# Patient Record
Sex: Female | Born: 1985
Health system: Southern US, Community
[De-identification: ages and names within clinical notes are randomized; demographics above are authoritative.]

## PROBLEM LIST (undated history)

## (undated) DIAGNOSIS — G709 Myoneural disorder, unspecified: Secondary | ICD-10-CM

## (undated) DIAGNOSIS — Z5189 Encounter for other specified aftercare: Secondary | ICD-10-CM

## (undated) DIAGNOSIS — M545 Low back pain, unspecified: Secondary | ICD-10-CM

## (undated) DIAGNOSIS — K219 Gastro-esophageal reflux disease without esophagitis: Secondary | ICD-10-CM

## (undated) DIAGNOSIS — L80 Vitiligo: Secondary | ICD-10-CM

## (undated) DIAGNOSIS — E063 Autoimmune thyroiditis: Secondary | ICD-10-CM

## (undated) DIAGNOSIS — D649 Anemia, unspecified: Secondary | ICD-10-CM

## (undated) DIAGNOSIS — O43129 Velamentous insertion of umbilical cord, unspecified trimester: Secondary | ICD-10-CM

## (undated) DIAGNOSIS — R002 Palpitations: Secondary | ICD-10-CM

## (undated) DIAGNOSIS — F419 Anxiety disorder, unspecified: Secondary | ICD-10-CM

## (undated) DIAGNOSIS — E282 Polycystic ovarian syndrome: Secondary | ICD-10-CM

## (undated) DIAGNOSIS — M549 Dorsalgia, unspecified: Secondary | ICD-10-CM

## (undated) DIAGNOSIS — F32A Depression, unspecified: Secondary | ICD-10-CM

## (undated) DIAGNOSIS — M2550 Pain in unspecified joint: Secondary | ICD-10-CM

## (undated) DIAGNOSIS — T7840XA Allergy, unspecified, initial encounter: Secondary | ICD-10-CM

## (undated) DIAGNOSIS — E079 Disorder of thyroid, unspecified: Secondary | ICD-10-CM

## (undated) DIAGNOSIS — M25551 Pain in right hip: Secondary | ICD-10-CM

## (undated) DIAGNOSIS — E559 Vitamin D deficiency, unspecified: Secondary | ICD-10-CM

## (undated) DIAGNOSIS — B009 Herpesviral infection, unspecified: Secondary | ICD-10-CM

## (undated) DIAGNOSIS — R0602 Shortness of breath: Secondary | ICD-10-CM

## (undated) DIAGNOSIS — Z8619 Personal history of other infectious and parasitic diseases: Secondary | ICD-10-CM

## (undated) DIAGNOSIS — K5900 Constipation, unspecified: Secondary | ICD-10-CM

## (undated) DIAGNOSIS — E739 Lactose intolerance, unspecified: Secondary | ICD-10-CM

## (undated) HISTORY — PX: NO PAST SURGERIES: SHX2092

## (undated) HISTORY — DX: Myoneural disorder, unspecified: G70.9

## (undated) HISTORY — DX: Vitamin D deficiency, unspecified: E55.9

## (undated) HISTORY — DX: Lactose intolerance, unspecified: E73.9

## (undated) HISTORY — DX: Allergy, unspecified, initial encounter: T78.40XA

## (undated) HISTORY — DX: Encounter for other specified aftercare: Z51.89

## (undated) HISTORY — DX: Pain in right hip: M25.551

## (undated) HISTORY — DX: Personal history of other infectious and parasitic diseases: Z86.19

## (undated) HISTORY — DX: Disorder of thyroid, unspecified: E07.9

## (undated) HISTORY — DX: Palpitations: R00.2

## (undated) HISTORY — DX: Polycystic ovarian syndrome: E28.2

## (undated) HISTORY — DX: Depression, unspecified: F32.A

## (undated) HISTORY — DX: Vitiligo: L80

## (undated) HISTORY — DX: Gastro-esophageal reflux disease without esophagitis: K21.9

## (undated) HISTORY — DX: Shortness of breath: R06.02

## (undated) HISTORY — DX: Dorsalgia, unspecified: M54.9

## (undated) HISTORY — DX: Herpesviral infection, unspecified: B00.9

## (undated) HISTORY — DX: Autoimmune thyroiditis: E06.3

## (undated) HISTORY — DX: Anemia, unspecified: D64.9

## (undated) HISTORY — DX: Velamentous insertion of umbilical cord, unspecified trimester: O43.129

## (undated) HISTORY — DX: Pain in unspecified joint: M25.50

## (undated) HISTORY — DX: Constipation, unspecified: K59.00

## (undated) HISTORY — DX: Anxiety disorder, unspecified: F41.9

## (undated) HISTORY — DX: Low back pain, unspecified: M54.50

---

## 2012-01-13 ENCOUNTER — Encounter: Payer: Self-pay | Admitting: Internal Medicine

## 2012-01-13 ENCOUNTER — Ambulatory Visit (INDEPENDENT_AMBULATORY_CARE_PROVIDER_SITE_OTHER): Payer: BC Managed Care – PPO | Admitting: Internal Medicine

## 2012-01-13 ENCOUNTER — Other Ambulatory Visit (INDEPENDENT_AMBULATORY_CARE_PROVIDER_SITE_OTHER): Payer: BC Managed Care – PPO

## 2012-01-13 VITALS — BP 126/82 | HR 72 | Temp 97.5°F | Ht 64.0 in | Wt 155.5 lb

## 2012-01-13 DIAGNOSIS — Z1329 Encounter for screening for other suspected endocrine disorder: Secondary | ICD-10-CM

## 2012-01-13 DIAGNOSIS — L709 Acne, unspecified: Secondary | ICD-10-CM

## 2012-01-13 DIAGNOSIS — Z131 Encounter for screening for diabetes mellitus: Secondary | ICD-10-CM

## 2012-01-13 DIAGNOSIS — Z13220 Encounter for screening for lipoid disorders: Secondary | ICD-10-CM

## 2012-01-13 DIAGNOSIS — Z0000 Encounter for general adult medical examination without abnormal findings: Secondary | ICD-10-CM

## 2012-01-13 DIAGNOSIS — Z130 Encounter for screening for diseases of the blood and blood-forming organs and certain disorders involving the immune mechanism: Secondary | ICD-10-CM

## 2012-01-13 DIAGNOSIS — L708 Other acne: Secondary | ICD-10-CM

## 2012-01-13 LAB — BASIC METABOLIC PANEL
BUN: 10 mg/dL (ref 6–23)
GFR: 66.35 mL/min (ref >60.00)
Potassium: 4.1 mEq/L (ref 3.5–5.1)
Sodium: 136 mEq/L (ref 135–145)

## 2012-01-13 LAB — CBC
Hemoglobin: 14.9 g/dL (ref 12.0–15.0)
RDW: 12.4 % (ref 11.5–14.6)
WBC: 5.6 10*3/uL (ref 4.5–10.5)

## 2012-01-13 LAB — LIPID PANEL
Total CHOL/HDL Ratio: 3
VLDL: 5.8 mg/dL (ref 0.0–40.0)

## 2012-01-13 LAB — TSH: TSH: 1.32 u[IU]/mL (ref 0.35–5.50)

## 2012-01-13 MED ORDER — BENZOYL PEROXIDE 5 % EX GEL: Freq: Every day | CUTANEOUS | Status: DC

## 2012-01-13 NOTE — Progress Notes (Signed)
HPI  Pt presents to the clinic today to establish care. Her only concern is that she has had an increase in acne and brittle hair over the last year. She is taking a prenatal vitamin. She does have a family history of thyroid problems. She has never been seen for her acne or hair problem.  Past Medical History  Diagnosis Date  . History of chicken pox     Current Outpatient Prescriptions  Medication Sig Dispense Refill  . benzoyl peroxide 5 % gel Apply topically daily.  45 g  0  . BIOTIN PO Take 1 tablet by mouth daily. 10,079mcg      . Prenatal Vitamins (DIS) TABS Take 1 tablet by mouth daily.        No Known Allergies  Family History  Problem Relation Age of Onset  . Cancer Mother     Cervical Cancer  . Hepatitis C Father   . Thyroid disease Maternal Aunt   . Thyroid disease Maternal Grandmother   . Stroke Neg Hx   . Hypertension Neg Hx   . Hyperlipidemia Neg Hx   . Early death Neg Hx     History   Social History  . Marital Status: Single    Spouse Name: N/A    Number of Children: N/A  . Years of Education: 16+   Occupational History  . Occupational therapist    Social History Main Topics  . Smoking status: Former Games developer  . Smokeless tobacco: Never Used  . Alcohol Use: 0.6 oz/week    1 Glasses of wine per week  . Drug Use: No  . Sexually Active: Not on file   Other Topics Concern  . Not on file   Social History Narrative   Regular exercise-yesCaffeine Use-yes    ROS:  Constitutional: Denies fever, malaise, fatigue, headache or abrupt weight changes.  HEENT: Denies eye pain, eye redness, ear pain, ringing in the ears, wax buildup, runny nose, nasal congestion, bloody nose, or sore throat. Respiratory: Denies difficulty breathing, shortness of breath, cough or sputum production.   Cardiovascular: Denies chest pain, chest tightness, palpitations or swelling in the hands or feet.  Gastrointestinal: Denies abdominal pain, bloating, constipation, diarrhea or  blood in the stool.  GU: Denies frequency, urgency, pain with urination, blood in urine, odor or discharge. Musculoskeletal: Denies decrease in range of motion, difficulty with gait, muscle pain or joint pain and swelling.  Skin: Denies redness, rashes, lesions or ulcercations.  Neurological: Denies dizziness, difficulty with memory, difficulty with speech or problems with balance and coordination.   No other specific complaints in a complete review of systems (except as listed in HPI above).  PE:  BP 126/82  Pulse 72  Temp 97.5 F (36.4 C) (Oral)  Ht 5\' 4"  (1.626 m)  Wt 155 lb 8 oz (70.534 kg)  BMI 26.69 kg/m2  SpO2 97%  LMP 12/20/2011 Wt Readings from Last 3 Encounters:  01/13/12 155 lb 8 oz (70.534 kg)    General: Appears their stated age, well developed, well nourished in NAD. Skin: Moderate acne on the base of the chin. Hair coarse to the touch. HEENT: Head: normal shape and size; Eyes: sclera white, no icterus, conjunctiva pink, PERRLA and EOMs intact; Ears: Tm's gray and intact, normal light reflex; Nose: mucosa pink and moist, septum midline; Throat/Mouth: Teeth present, mucosa pink and moist, no lesions or ulcerations noted.  Neck: Normal range of motion. Neck supple, trachea midline. No massses, lumps or thyromegaly present.  Cardiovascular: Normal  rate and rhythm. S1,S2 noted.  No murmur, rubs or gallops noted. No JVD or BLE edema. No carotid bruits noted. Pulmonary/Chest: Normal effort and positive vesicular breath sounds. No respiratory distress. No wheezes, rales or ronchi noted.  Abdomen: Soft and nontender. Normal bowel sounds, no bruits noted. No distention or masses noted. Liver, spleen and kidneys non palpable. Musculoskeletal: Normal range of motion. No signs of joint swelling. No difficulty with gait.  Neurological: Alert and oriented. Cranial nerves II-XII intact. Coordination normal. +DTRs bilaterally. Psychiatric: Mood and affect normal. Behavior is normal.  Judgment and thought content normal.     Assessment and Plan:  Preventative Health Maintenance:  Will obtain basic labs, CBC, BMET, lipids,  Continue diet and exercise Schedule pap smear next year Decline flu shot Tetanus UTD  Brittle hair and acne, new onset with additional workup required:  Will check TSH and testosterone eRx given for benzyl peroxide  RTC in 1 year or sooner if needed

## 2012-01-13 NOTE — Patient Instructions (Signed)
Health Maintenance, Females A healthy lifestyle and preventative care can promote health and wellness.  Maintain regular health, dental, and eye exams.  Eat a healthy diet. Foods like vegetables, fruits, whole grains, low-fat dairy products, and lean protein foods contain the nutrients you need without too many calories. Decrease your intake of foods high in solid fats, added sugars, and salt. Get information about a proper diet from your caregiver, if necessary.  Regular physical exercise is one of the most important things you can do for your health. Most adults should get at least 150 minutes of moderate-intensity exercise (any activity that increases your heart rate and causes you to sweat) each week. In addition, most adults need muscle-strengthening exercises on 2 or more days a week.   Maintain a healthy weight. The body mass index (BMI) is a screening tool to identify possible weight problems. It provides an estimate of body fat based on height and weight. Your caregiver can help determine your BMI, and can help you achieve or maintain a healthy weight. For adults 20 years and older:  A BMI below 18.5 is considered underweight.  A BMI of 18.5 to 24.9 is normal.  A BMI of 25 to 29.9 is considered overweight.  A BMI of 30 and above is considered obese.  Maintain normal blood lipids and cholesterol by exercising and minimizing your intake of saturated fat. Eat a balanced diet with plenty of fruits and vegetables. Blood tests for lipids and cholesterol should begin at age 20 and be repeated every 5 years. If your lipid or cholesterol levels are high, you are over 50, or you are a high risk for heart disease, you may need your cholesterol levels checked more frequently.Ongoing high lipid and cholesterol levels should be treated with medicines if diet and exercise are not effective.  If you smoke, find out from your caregiver how to quit. If you do not use tobacco, do not start.  If you  are pregnant, do not drink alcohol. If you are breastfeeding, be very cautious about drinking alcohol. If you are not pregnant and choose to drink alcohol, do not exceed 1 drink per day. One drink is considered to be 12 ounces (355 mL) of beer, 5 ounces (148 mL) of wine, or 1.5 ounces (44 mL) of liquor.  Avoid use of street drugs. Do not share needles with anyone. Ask for help if you need support or instructions about stopping the use of drugs.  High blood pressure causes heart disease and increases the risk of stroke. Blood pressure should be checked at least every 1 to 2 years. Ongoing high blood pressure should be treated with medicines, if weight loss and exercise are not effective.  If you are 55 to 26 years old, ask your caregiver if you should take aspirin to prevent strokes.  Diabetes screening involves taking a blood sample to check your fasting blood sugar level. This should be done once every 3 years, after age 45, if you are within normal weight and without risk factors for diabetes. Testing should be considered at a younger age or be carried out more frequently if you are overweight and have at least 1 risk factor for diabetes.  Breast cancer screening is essential preventative care for women. You should practice "breast self-awareness." This means understanding the normal appearance and feel of your breasts and may include breast self-examination. Any changes detected, no matter how small, should be reported to a caregiver. Women in their 20s and 30s should have   a clinical breast exam (CBE) by a caregiver as part of a regular health exam every 1 to 3 years. After age 40, women should have a CBE every year. Starting at age 40, women should consider having a mammogram (breast X-ray) every year. Women who have a family history of breast cancer should talk to their caregiver about genetic screening. Women at a high risk of breast cancer should talk to their caregiver about having an MRI and a  mammogram every year.  The Pap test is a screening test for cervical cancer. Women should have a Pap test starting at age 21. Between ages 21 and 29, Pap tests should be repeated every 2 years. Beginning at age 30, you should have a Pap test every 3 years as long as the past 3 Pap tests have been normal. If you had a hysterectomy for a problem that was not cancer or a condition that could lead to cancer, then you no longer need Pap tests. If you are between ages 65 and 70, and you have had normal Pap tests going back 10 years, you no longer need Pap tests. If you have had past treatment for cervical cancer or a condition that could lead to cancer, you need Pap tests and screening for cancer for at least 20 years after your treatment. If Pap tests have been discontinued, risk factors (such as a new sexual partner) need to be reassessed to determine if screening should be resumed. Some women have medical problems that increase the chance of getting cervical cancer. In these cases, your caregiver may recommend more frequent screening and Pap tests.  The human papillomavirus (HPV) test is an additional test that may be used for cervical cancer screening. The HPV test looks for the virus that can cause the cell changes on the cervix. The cells collected during the Pap test can be tested for HPV. The HPV test could be used to screen women aged 30 years and older, and should be used in women of any age who have unclear Pap test results. After the age of 30, women should have HPV testing at the same frequency as a Pap test.  Colorectal cancer can be detected and often prevented. Most routine colorectal cancer screening begins at the age of 50 and continues through age 75. However, your caregiver may recommend screening at an earlier age if you have risk factors for colon cancer. On a yearly basis, your caregiver may provide home test kits to check for hidden blood in the stool. Use of a small camera at the end of a  tube, to directly examine the colon (sigmoidoscopy or colonoscopy), can detect the earliest forms of colorectal cancer. Talk to your caregiver about this at age 50, when routine screening begins. Direct examination of the colon should be repeated every 5 to 10 years through age 75, unless early forms of pre-cancerous polyps or small growths are found.  Hepatitis C blood testing is recommended for all people born from 1945 through 1965 and any individual with known risks for hepatitis C.  Practice safe sex. Use condoms and avoid high-risk sexual practices to reduce the spread of sexually transmitted infections (STIs). Sexually active women aged 25 and younger should be checked for Chlamydia, which is a common sexually transmitted infection. Older women with new or multiple partners should also be tested for Chlamydia. Testing for other STIs is recommended if you are sexually active and at increased risk.  Osteoporosis is a disease in which the   bones lose minerals and strength with aging. This can result in serious bone fractures. The risk of osteoporosis can be identified using a bone density scan. Women ages 65 and over and women at risk for fractures or osteoporosis should discuss screening with their caregivers. Ask your caregiver whether you should be taking a calcium supplement or vitamin D to reduce the rate of osteoporosis.  Menopause can be associated with physical symptoms and risks. Hormone replacement therapy is available to decrease symptoms and risks. You should talk to your caregiver about whether hormone replacement therapy is right for you.  Use sunscreen with a sun protection factor (SPF) of 30 or greater. Apply sunscreen liberally and repeatedly throughout the day. You should seek shade when your shadow is shorter than you. Protect yourself by wearing long sleeves, pants, a wide-brimmed hat, and sunglasses year round, whenever you are outdoors.  Notify your caregiver of new moles or  changes in moles, especially if there is a change in shape or color. Also notify your caregiver if a mole is larger than the size of a pencil eraser.  Stay current with your immunizations. Document Released: 08/06/2010 Document Revised: 04/15/2011 Document Reviewed: 08/06/2010 [employer] Patient Information 2013 [employer]. Breast Self-Exam A self breast exam may help you find changes or problems while they are still small. Do a breast self-exam:  Every month.  One week after your period (menstrual period).  On the first day of each month if you do not have periods anymore. Look for any:  Change in breast color, size, or shape.  Dimples in your breast.  Changes in your nipples or skin.  Dry skin on your breasts or nipples.  Watery or bloody discharge from your nipples.  Feel for:  Lumps.  Thick, hard places.  Any other changes. HOME CARE There are 3 ways to do the breast self-exam: In front of a mirror.  Lift your arms over your head and turn side to side.  Put your hands on your hips and lean down, then turn from side to side.  Bend forward and turn from side to side. In the shower.  With soapy hands, check both breasts. Then check above and below your collarbone and your armpits.  Feel above and below your collarbone down to under your breast, and from the center of your chest to the outer edge of the armpit. Check for any lumps or hard spots.  Using the tips of your middle three fingers check your whole breast by pressing your hand over your breast in a circle or in an up and down motion. Lying down.  Lie flat on your bed.  Put a small pillow under the breast you are going to check. On that same side, put your hand behind your head.  With your other hand, use the 3 middle fingers to feel the breast.  Move your fingers in a circle around the breast. Press firmly over all parts of the breast to feel for any lumps. GET HELP RIGHT AWAY IF: You find any  changes in your breasts so they can be checked. Document Released: 07/10/2007 Document Revised: 04/15/2011 Document Reviewed: 05/11/2008 [employer] Patient Information 2013 [employer].  

## 2012-01-14 ENCOUNTER — Telehealth: Payer: Self-pay | Admitting: *Deleted

## 2012-01-14 ENCOUNTER — Encounter: Payer: Self-pay | Admitting: Internal Medicine

## 2012-01-14 LAB — TESTOSTERONE, FREE, TOTAL, SHBG
Testosterone-% Free: 1.4 % (ref 0.4–2.4)
Testosterone: 73.9 ng/dL — ABNORMAL HIGH (ref 10–70)

## 2012-01-14 NOTE — Telephone Encounter (Signed)
Left message for pt to callback office.     [name],  Can you please send this and also call [name] and let her know that her labs were normal except her testosterone level which was high. This is most likely the cause of her acne and brittle hair. I suggest that she f/u with endocrinology. I will put in that referral for her. Thx!  Rene Kocher

## 2012-01-14 NOTE — Telephone Encounter (Signed)
Left message for pt to callback office.  

## 2012-01-16 NOTE — Telephone Encounter (Signed)
Left message for pt to callback office.  

## 2012-01-16 NOTE — Telephone Encounter (Signed)
Pt informed of results and of referral to endo.

## 2012-01-27 ENCOUNTER — Telehealth: Payer: Self-pay | Admitting: Internal Medicine

## 2012-01-27 ENCOUNTER — Other Ambulatory Visit: Payer: Self-pay | Admitting: Internal Medicine

## 2012-01-27 DIAGNOSIS — R7989 Other specified abnormal findings of blood chemistry: Secondary | ICD-10-CM

## 2012-01-27 NOTE — Progress Notes (Signed)
High testosterone, would like referral to endocrinology

## 2012-01-27 NOTE — Telephone Encounter (Signed)
Appt has been made and pt is aware.

## 2012-01-27 NOTE — Telephone Encounter (Signed)
[name], Referral placed. Let her know that our PCC's with call her with the appointment date and time. Thx! Rene Kocher

## 2012-01-27 NOTE — Telephone Encounter (Signed)
Pt received a letter suggesting she be referred to endocrinology.  She called to say she does want the referral.

## 2012-01-28 ENCOUNTER — Ambulatory Visit: Payer: BC Managed Care – PPO | Admitting: Internal Medicine

## 2012-02-04 ENCOUNTER — Ambulatory Visit (INDEPENDENT_AMBULATORY_CARE_PROVIDER_SITE_OTHER): Payer: BC Managed Care – PPO | Admitting: Internal Medicine

## 2012-02-04 VITALS — BP 122/80 | HR 93 | Wt 156.0 lb

## 2012-02-04 DIAGNOSIS — E282 Polycystic ovarian syndrome: Secondary | ICD-10-CM

## 2012-02-04 DIAGNOSIS — E281 Androgen excess: Secondary | ICD-10-CM | POA: Insufficient documentation

## 2012-02-04 DIAGNOSIS — E288 Other ovarian dysfunction: Secondary | ICD-10-CM

## 2012-02-04 LAB — HEMOGLOBIN A1C
Hgb A1c MFr Bld: 5.2 % (ref <5.7)
Mean Plasma Glucose: 103 mg/dL (ref <117)

## 2012-02-04 LAB — ESTRADIOL: Estradiol: 57.3 pg/mL

## 2012-02-04 LAB — LUTEINIZING HORMONE: LH: 6.7 m[IU]/mL

## 2012-02-04 NOTE — Progress Notes (Signed)
Subjective:     Patient ID: [name], female   DOB: Jun 01, 1985, 26 y.o.   MRN: [id number]  HPI [name] is a very pleasant 26 y/o woman referred by her PCP, Dr. Sampson Camacho, for management of hyperandrogenemia, acne, and hirsutism.   Pt complains of having acne since puberty but in "bouts" - lately (in last year) harder to go away and painful. It occurs always on the chin. She started Benzoil peroxide by Dr. Sampson Camacho > acne a little better.   She also has ark hairs on neck and chest but not face, however she noted dark hairs on upper lip. Her scalp feels inflamed and burning, some thinning, losing some hair. She does have acne near hairline. Skin on scalp is scaly. No balding spots. She started to notice the hair 4 years ago, now increased. She also feels her scalp burning sometimes (started during the summer), despite changing her shampoo to [place].  Her menstrual cycles used to come every month (not exactly at 28 days), but missed November. She was on OCP (last dose 06/2011) - 21/7, cannot remember name. She feels her periods are now the same as before she started the pill. She was on OCPs for 8 years. When she was on it her hair and acne was not as bad, but in the last year, she was changed to another OCP and acne got worse.   She does not want to get pregnant for now.   No recent h/o weight gain or loss. She avoids a lot of sugar, wheat or dairy. No migraines, no HTN, no FH or personal h/o heart ds, no smoking, no personal or family h/o blood clots. No FH of diabetes. Aunts have thyroid ds. No FH of infertility or irreg. menses.   I reviewed her chart including office notes, telephone notes, labs. 01/13/12: BMP nl, CBC nl, testosterone free 10 (0.6-6.8), SHBG 52 (18-114), lipids: 188/29/64/118  Review of Systems Constitutional: no weight gain/loss, + fatigue, no subjective hyperthermia/hypothermia. No increased thirst, urinates more frequently since starting to drink more water Eyes: no blurry  vision, no xerophthalmia ENT: no sore throat, no nodules palpated in throat, no dysphagia/odynophagia, no hoarseness Cardiovascular: no CP/SOB/palpitations/leg swelling Respiratory: no cough/SOB Gastrointestinal: no N/V/D/C Musculoskeletal: no muscle/joint aches Skin: acne, excess hair on neck and chest, no new, wide, stretch marks Neurological: no tremors/numbness/tingling/dizziness Psychiatric: no depression/anxiety  Past Medical History  Diagnosis Date  . History of chicken pox    No past surgical history.  History   Social History  . Marital Status: Single    Spouse Name: N/A    Number of Children: N/A  . Years of Education: 16+   Occupational History  . Occupational therapist    Social History Main Topics  . Smoking status: Former Games developer  . Smokeless tobacco: Never Used  . Alcohol Use: 0.6 oz/week    1 Glasses of wine per week  . Drug Use: No  . Sexually Active: Not on file   Other Topics Concern  . Not on file   Social History Narrative   Regular exercise-yesCaffeine Use-yes   Current Outpatient Prescriptions on File Prior to Visit  Medication Sig Dispense Refill  . benzoyl peroxide 5 % gel Apply topically daily.  45 g  0  . BIOTIN PO Take 1 tablet by mouth daily. 10,043mcg      . Prenatal Vitamins (DIS) TABS Take 1 tablet by mouth daily. - to help with hair thinning       No  Known Allergies  Family History  Problem Relation Age of Onset  . Cancer Mother     Cervical Cancer  . Hepatitis C Father   . Thyroid disease Maternal Aunt   . Thyroid disease Maternal Grandmother   . Stroke Neg Hx   . Hypertension Neg Hx   . Hyperlipidemia Neg Hx   . Early death Neg Hx    Objective:   Physical Exam BP 122/80  Pulse 93  Wt 156 lb (70.761 kg)  SpO2 98%  LMP 02/01/2012, before that: 12/20/2011 Wt Readings from Last 3 Encounters:  02/04/12 156 lb (70.761 kg)  01/13/12 155 lb 8 oz (70.534 kg)   Constitutional: overweight, in NAD Eyes: PERRLA, EOMI, no  exophthalmos ENT: moist mucous membranes, no thyromegaly, no cervical lymphadenopathy Cardiovascular: RRR, No MRG Respiratory: CTA B Gastrointestinal: abdomen soft, NT, ND, BS+ Musculoskeletal: no deformities, strength intact in all 4 Skin: moist, warm, no rashes; no skin tags or acanthosis nigricans. Vellum on sideburns, some roots for terminal hair visible on neck (<10) and chest (same) - plucked. Few shaft around each nipple (plucked). Darker vellum above upper lip. Mild acne on chin. No abnormalities seen on scalp. Neurological: no tremor with outstretched hands, DTR normal in all 4  Assessment:     1. PCOS - testosterone free 10  (0.6-6.8), SHBG 52 (18-114) - lipids: 188/29/64/118    Plan:     Pt likely has a mild form of PCOS as she meets 2/3  [place] criteria: has hyperandrogenemia and irregular menstrual cycles (skipped period last month, variable intervals between menses).  - Will order: Orders Placed This Encounter  Procedures  . Prolactin  . TSH  . HgB A1c  . 17-Hydroxyprogesterone  . Estradiol  . LH  . FSH  - weight loss is usually the first measurement to decrease insulin resistance, and thus hyperandrogenemia manifestations (acne and hirsutism) - I advised her to continue to institute healthy changes in her diet as she already started to do and also to try to lose a little weight - I also recommended that she restarted the OCP that she tolerated well in the past and she will find out the name and call me back - I recommended that she starts seeing an ObGyn - Laser therapy is also an option for her since she has such few terminal hair shafts on neck/chest - If results not satisfactory, we can add Eflornitine cream (although with these, the hair is controlled only for as long she applies it) or Spironolactone (to add to OCP) - I advised her to see a dermatologist if the scalp burning sensation recurs - I will see the pt back in 6 mo for f/u - Advised to join  [employer] Visit on 02/04/2012  Component Date Value Range Status  . Prolactin 02/04/2012 5.4   Final   Comment:      Reference Ranges:                                           Female:                       2.1 -  17.1 ng/ml  Female:   Pregnant          9.7 - 208.5 ng/mL                                                     Non Pregnant      2.8 -  29.2 ng/mL                                                     Post Menopausal   1.8 -  20.3 ng/mL                                              . TSH 02/04/2012 1.642  0.350 - 4.500 uIU/mL Final  . Hemoglobin A1C 02/04/2012 5.2  <5.7 % Final   Comment:                                                                                                 According to the ADA Clinical Practice Recommendations for 2011, when                          HbA1c is used as a screening test:                                                       >=6.5%   Diagnostic of Diabetes Mellitus                                     (if abnormal result is confirmed)                                                     5.7-6.4%   Increased risk of developing Diabetes Mellitus                                                     References:Diagnosis and Classification of Diabetes Mellitus,Diabetes                          Care,2011,34(Suppl 1):S62-S69 and Standards of Medical  Care in                                  Diabetes - 2011,Diabetes Care,2011,34 (Suppl 1):S11-S61.                             . Mean Plasma Glucose 02/04/2012 103  <117 mg/dL Final  . 14-NW-[id number], LC/MS/MS 02/04/2012 22   Final   Comment:                            Adult Female Reference Ranges for                            17-Hydroxyprogesterone, LC/MS/MS:                                                        Follicular Phase:       < or = 185 ng/dL                             Luteal Phase:           < or = 285 ng/dL                              Postmenopausal Phase:   < or = 45 ng/dL                             Pregnancy:                             First Trimester:  78-457 ng/dL                             Second Trimester: 90-357 ng/dL                             Third Trimester:  144-578 ng/dL  . Estradiol 02/04/2012 57.3   Final   Comment:                               Males                           0.0 -  39.0 pg/mL                                                        Menstruating Females (by day in cycle relative to LH peak)  Follicular phase (-12 to -4)   19.5 - 144.2 pg/mL                                                        Midcycle          (-3 to +2)   63.9 - 356.7 pg/mL                                                          Postmenopausal Females          0.0 -  32.2 pg/mL                               (untreated)                             . LH 02/04/2012 6.7   Final   Comment: Reference Ranges:                                   Female:     20 - 70 Years           1.5 -  9.3 mIU/mL                                                > 70 Years           3.1 - 34.6 mIU/mL                                   Female:   Follicular Phase        1.9 - 12.5 mIU/mL                                             Midcycle                8.7 - 76.3 mIU/mL                                             Luteal Phase            0.5 - 16.9 mIU/mL                                             Post Menopausal        15.9 - 54.0 mIU/mL  Pregnant                    <  1.5 mIU/mL                                             Contraceptives          0.7 -  5.6 mIU/mL                                   Children:                             <  6.0 mIU/mL                             . [facility] 02/04/2012 7.2   Final   Comment: Reference Ranges:                                   Female:                         1.4 -  18.1 mIU/mL                                    Female:   Follicular Phase    2.5 -  10.2 mIU/mL                                             MidCycle Peak       3.4 -  33.4 mIU/mL                                             Luteal Phase        1.5 -   9.1 mIU/mL                                             Post Menopausal    23.0 - 116.3 mIU/mL                                             Pregnant                <   0.3 mIU/mL   Normal HbA1C (no prediabetes/diabetes), normal TSH (no thyroid ds.), normal PRL (no prolactinoma),17 HO Progesterone and estradiol (no Nonclassic CAH), and normal LH, FSH and E2. In the light of recent high free testosterone and missed menstrual cycles, she might have PCOS, although a mild form. I believe that her metabolic status has been improving since she changed her diet. Will send  her letter as she does not have an active MyChart yet.  Will await her phone call to start her OCP.

## 2012-02-04 NOTE — Patient Instructions (Addendum)
You most likely have a mild form of polycystic ovarian syndrome. Please call me with the name of the oral contraceptive that you had before. I will let you know the results of your labs. Please try to join MyChart.

## 2012-02-09 LAB — 17-HYDROXYPROGESTERONE: 17-OH-Progesterone, LC/MS/MS: 22 ng/dL

## 2012-02-10 ENCOUNTER — Encounter: Payer: Self-pay | Admitting: Internal Medicine

## 2012-02-13 ENCOUNTER — Encounter: Payer: Self-pay | Admitting: Internal Medicine

## 2012-02-24 ENCOUNTER — Ambulatory Visit (INDEPENDENT_AMBULATORY_CARE_PROVIDER_SITE_OTHER): Payer: BC Managed Care – PPO | Admitting: Internal Medicine

## 2012-02-24 ENCOUNTER — Encounter: Payer: Self-pay | Admitting: Internal Medicine

## 2012-02-24 ENCOUNTER — Other Ambulatory Visit: Payer: Self-pay | Admitting: Internal Medicine

## 2012-02-24 ENCOUNTER — Telehealth: Payer: Self-pay | Admitting: *Deleted

## 2012-02-24 VITALS — BP 130/78 | HR 74 | Temp 99.3°F | Ht 64.0 in | Wt 156.2 lb

## 2012-02-24 DIAGNOSIS — J069 Acute upper respiratory infection, unspecified: Secondary | ICD-10-CM

## 2012-02-24 DIAGNOSIS — H109 Unspecified conjunctivitis: Secondary | ICD-10-CM

## 2012-02-24 MED ORDER — AZITHROMYCIN 1 % OP SOLN: 2.0000 [drp] | Freq: Two times a day (BID) | OPHTHALMIC | Status: DC

## 2012-02-24 MED ORDER — ERYTHROMYCIN 5 MG/GM OP OINT: TOPICAL_OINTMENT | Freq: Every day | OPHTHALMIC | Status: DC

## 2012-02-24 NOTE — Patient Instructions (Addendum)
Conjunctivitis Conjunctivitis is commonly called "pink eye." Conjunctivitis can be caused by bacterial or viral infection, allergies, or injuries. There is usually redness of the lining of the eye, itching, discomfort, and sometimes discharge. There may be deposits of matter along the eyelids. A viral infection usually causes a watery discharge, while a bacterial infection causes a yellowish, thick discharge. Pink eye is very contagious and spreads by direct contact. You may be given antibiotic eyedrops as part of your treatment. Before using your eye medicine, remove all drainage from the eye by washing gently with warm water and cotton balls. Continue to use the medication until you have awakened 2 mornings in a row without discharge from the eye. Do not rub your eye. This increases the irritation and helps spread infection. Use separate towels from other household members. Wash your hands with soap and water before and after touching your eyes. Use cold compresses to reduce pain and sunglasses to relieve irritation from light. Do not wear contact lenses or wear eye makeup until the infection is gone. SEEK MEDICAL CARE IF:   Your symptoms are not better after 3 days of treatment.  You have increased pain or trouble seeing.  The outer eyelids become very red or swollen. Document Released: 02/29/2004 Document Revised: 04/15/2011 Document Reviewed: 01/21/2005 ExitCare Patient Information 2013 [employer].  

## 2012-02-24 NOTE — Progress Notes (Signed)
Subjective:    Patient ID: [name], female    DOB: January 13, 1986, 27 y.o.   MRN: [id number]  HPI  Pt presents to the clinic today with c/o possible pink eye. This started in her right eye 4 days ago. She woke up and her eye was red and painful and stuck shut with green goop. It has now progressed to her left eye. She also c/o of a cough with green mucous production. This started 2 days ago. She does not fee bad. She has not taken anything OTC for this. She does have sick contacts.   Review of Systems  Past Medical History  Diagnosis Date  . History of chicken pox     Current Outpatient Prescriptions  Medication Sig Dispense Refill  . benzoyl peroxide 5 % gel Apply topically daily.  45 g  0  . BIOTIN PO Take 1 tablet by mouth daily. 10,039mcg      . Prenatal Vitamins (DIS) TABS Take 1 tablet by mouth daily.        No Known Allergies  Family History  Problem Relation Age of Onset  . Cancer Mother     Cervical Cancer  . Hepatitis C Father   . Thyroid disease Maternal Aunt   . Thyroid disease Maternal Grandmother   . Stroke Neg Hx   . Hypertension Neg Hx   . Hyperlipidemia Neg Hx   . Early death Neg Hx     History   Social History  . Marital Status: Single    Spouse Name: N/A    Number of Children: N/A  . Years of Education: 16+   Occupational History  . Occupational therapist    Social History Main Topics  . Smoking status: Former Games developer  . Smokeless tobacco: Never Used  . Alcohol Use: 0.6 oz/week    1 Glasses of wine per week  . Drug Use: No  . Sexually Active: Not on file   Other Topics Concern  . Not on file   Social History Narrative   Regular exercise-yesCaffeine Use-yes     Constitutional: Denies fever, malaise, fatigue, headache or abrupt weight changes.  HEENT: Pt reports eye pain, eye redness and crusting. Denies blurred ear pain, ringing in the ears, wax buildup, runny nose, nasal congestion, bloody nose, or sore throat.  Respiratory: Pt  reports cough and mucous production. Denies difficulty breathing or shortness of breath. Skin: Denies redness, rashes, lesions or ulcercations.    No other specific complaints in a complete review of systems (except as listed in HPI above).     Objective:   Physical Exam   BP 130/78  Pulse 74  Temp 99.3 F (37.4 C) (Oral)  Ht 5\' 4"  (1.626 m)  Wt 156 lb 4 oz (70.875 kg)  BMI 26.82 kg/m2  SpO2 97%  LMP 01/19/2012 Wt Readings from Last 3 Encounters:  02/24/12 156 lb 4 oz (70.875 kg)  02/04/12 156 lb (70.761 kg)  01/13/12 155 lb 8 oz (70.534 kg)    General: Appears her stated age, well developed, well nourished in NAD. Skin: Warm, dry and intact. No rashes, lesions or ulcerations noted. HEENT: Head: normal shape and size; Eyes: sclera injected, no icterus, conjunctiva erythematous, green discharge noted, PERRLA and EOMs intact; Ears: Tm's gray and intact, normal light reflex; Nose: mucosa pink and moist, septum midline; Throat/Mouth: Teeth present, mucosa pink and moist, no exudate, lesions or ulcerations noted.  Neck: Normal range of motion. Neck supple, trachea midline. No massses, lumps or thyromegaly  present.  Cardiovascular: Normal rate and rhythm. S1,S2 noted.  No murmur, rubs or gallops noted. No JVD or BLE edema. No carotid bruits noted. Pulmonary/Chest: Normal effort and positive vesicular breath sounds. No respiratory distress. No wheezes, rales or ronchi noted.        Assessment & Plan:   Conjunctivitis, likely viral, new onset with additional workup required:  eRx for Azithromycin eye drops Place cool compresses to the eye daily Wash hands thoroughly and avoid touching your eyes  URI, new onset with additional workup required:  Ibuprofen for pain and fever Drink lots of fluids and get plenty of rest Can take OTC Mucinex  RTC as needed or if symptoms persist

## 2012-02-24 NOTE — Telephone Encounter (Signed)
Ash, Changed to erythromycin eye ointment. Apply to both eyes at night. Sent in to pharmacy. Rene Kocher

## 2012-02-24 NOTE — Telephone Encounter (Signed)
[facility] called, azithromycin (AZASITE) 1 % ophthalmic solution eye drops are on backorder until February 17th, they want to know if you want to change to something else.

## 2012-03-27 ENCOUNTER — Ambulatory Visit (INDEPENDENT_AMBULATORY_CARE_PROVIDER_SITE_OTHER): Payer: BC Managed Care – PPO | Admitting: Internal Medicine

## 2012-03-27 ENCOUNTER — Encounter: Payer: Self-pay | Admitting: Internal Medicine

## 2012-03-27 VITALS — BP 110/78 | HR 68 | Temp 97.9°F | Ht 64.0 in | Wt 152.0 lb

## 2012-03-27 DIAGNOSIS — H579 Unspecified disorder of eye and adnexa: Secondary | ICD-10-CM

## 2012-03-27 MED ORDER — OLOPATADINE HCL 0.2 % OP SOLN: 1.0000 [drp] | Freq: Every morning | OPHTHALMIC | Status: DC

## 2012-03-27 NOTE — Progress Notes (Signed)
Subjective:    Patient ID: [name], female    DOB: 05/31/1985, 27 y.o.   MRN: [id number]  HPI  Pt presents to the clinic today with c/o a possible eye infection. She was seen 1 month ago for conjunctivitis and prescribed erythromycin eye ointment. That cleared up but she feels like her eyes are irritated, gritty, watery and red. She has not been wearing her contacts. She has put saline drops in her eyes which has not really helped. She has never had problems with eye irritation in the past that lasted this long. She does have some mild seasonal allergies.  Review of Systems      Past Medical History  Diagnosis Date  . History of chicken pox     Current Outpatient Prescriptions  Medication Sig Dispense Refill  . benzoyl peroxide 5 % gel Apply topically daily.  45 g  0  . BIOTIN PO Take 1 tablet by mouth daily. 10,073mcg      . erythromycin ophthalmic ointment Place into both eyes at bedtime.  3.5 g  0  . Multiple Vitamin (MULTIVITAMIN) tablet Take 1 tablet by mouth daily.       No current facility-administered medications for this visit.    No Known Allergies  Family History  Problem Relation Age of Onset  . Cancer Mother     Cervical Cancer  . Hepatitis C Father   . Thyroid disease Maternal Aunt   . Thyroid disease Maternal Grandmother   . Stroke Neg Hx   . Hypertension Neg Hx   . Hyperlipidemia Neg Hx   . Early death Neg Hx     History   Social History  . Marital Status: Single    Spouse Name: N/A    Number of Children: N/A  . Years of Education: 16+   Occupational History  . Occupational therapist    Social History Main Topics  . Smoking status: Former Games developer  . Smokeless tobacco: Never Used  . Alcohol Use: 0.6 oz/week    1 Glasses of wine per week  . Drug Use: No  . Sexually Active: Not on file   Other Topics Concern  . Not on file   Social History Narrative   Regular exercise-yes   Caffeine Use-yes     Constitutional: Denies fever,  malaise, fatigue, headache or abrupt weight changes.  HEENT: Pt reports eye irritation and redness. Denies ear pain, ringing in the ears, wax buildup, runny nose, nasal congestion, bloody nose, or sore throat. .    No other specific complaints in a complete review of systems (except as listed in HPI above).  Objective:   Physical Exam  BP 110/78  Pulse 68  Temp(Src) 97.9 F (36.6 C) (Oral)  Ht 5\' 4"  (1.626 m)  Wt 152 lb (68.947 kg)  BMI 26.08 kg/m2  SpO2 97%  LMP 03/08/2012 Wt Readings from Last 3 Encounters:  03/27/12 152 lb (68.947 kg)  02/24/12 156 lb 4 oz (70.875 kg)  02/04/12 156 lb (70.761 kg)    General: Appears her stated age, well developed, well nourished in NAD. HEENT: Head: normal shape and size; Eyes: sclera injected, no icterus, conjunctiva pink, PERRLA and EOMs intact; Ears: Tm's gray and intact, normal light reflex; Nose: mucosa pink and moist, septum midline; Throat/Mouth: Teeth present, mucosa pink and moist, no exudate, lesions or ulcerations noted.  Cardiovascular: Normal rate and rhythm. S1,S2 noted.  No murmur, rubs or gallops noted. No JVD or BLE edema. No carotid bruits noted. Pulmonary/Chest:  Normal effort and positive vesicular breath sounds. No respiratory distress. No wheezes, rales or ronchi noted.       Assessment & Plan:   Eye irritation, secondary to allergies, new onset with additional workup required:  Can take OTC Zyrtec 10 mg daily eRx for Pataday op solution 1 drop both eyes daily x 5 days Can put cool compresses on eyes daily  RTC as needed or if symptoms persist

## 2013-05-06 ENCOUNTER — Telehealth: Payer: Self-pay | Admitting: Internal Medicine

## 2013-05-06 NOTE — Telephone Encounter (Signed)
Pt would like to transfer to NP. Pt does not want to travel to [place]

## 2013-05-06 NOTE — Telephone Encounter (Signed)
lmom for pt to sch appt °

## 2013-05-06 NOTE — Telephone Encounter (Signed)
Ok with me 

## 2013-05-06 NOTE — Telephone Encounter (Signed)
Ok, per [employer]

## 2013-05-10 ENCOUNTER — Ambulatory Visit: Payer: BC Managed Care – PPO | Admitting: Internal Medicine

## 2013-05-11 NOTE — Telephone Encounter (Signed)
lmom for pt to sch appt °

## 2013-05-13 NOTE — Telephone Encounter (Signed)
lmom for pt to sch appt °

## 2016-07-11 ENCOUNTER — Other Ambulatory Visit: Payer: Self-pay | Admitting: Family Medicine

## 2016-07-11 DIAGNOSIS — R1013 Epigastric pain: Secondary | ICD-10-CM

## 2016-07-17 ENCOUNTER — Ambulatory Visit
Admission: RE | Admit: 2016-07-17 | Discharge: 2016-07-17 | Disposition: A | Discharge status: Home / Self Care | Payer: BLUE CROSS/BLUE SHIELD | Source: Ambulatory Visit | Attending: Family Medicine | Admitting: Family Medicine

## 2016-07-17 DIAGNOSIS — R1013 Epigastric pain: Secondary | ICD-10-CM

## 2017-11-13 ENCOUNTER — Other Ambulatory Visit: Payer: Self-pay | Admitting: Family Medicine

## 2017-11-13 DIAGNOSIS — R109 Unspecified abdominal pain: Secondary | ICD-10-CM

## 2018-01-12 ENCOUNTER — Ambulatory Visit
Admission: RE | Admit: 2018-01-12 | Discharge: 2018-01-12 | Disposition: A | Discharge status: Home / Self Care | Payer: No Typology Code available for payment source | Source: Ambulatory Visit | Attending: Family Medicine | Admitting: Family Medicine

## 2018-01-12 DIAGNOSIS — R109 Unspecified abdominal pain: Secondary | ICD-10-CM

## 2018-01-12 MED ORDER — IOPAMIDOL (ISOVUE-300) INJECTION 61%
100.0000 mL | Freq: Once | INTRAVENOUS | Status: AC | PRN
  Administered 2018-01-12: 100 mL via INTRAVENOUS

## 2019-07-22 DIAGNOSIS — N911 Secondary amenorrhea: Secondary | ICD-10-CM | POA: Diagnosis not present

## 2019-07-29 DIAGNOSIS — Z3685 Encounter for antenatal screening for Streptococcus B: Secondary | ICD-10-CM | POA: Diagnosis not present

## 2019-07-29 DIAGNOSIS — Z3481 Encounter for supervision of other normal pregnancy, first trimester: Secondary | ICD-10-CM | POA: Diagnosis not present

## 2019-08-12 DIAGNOSIS — Z3400 Encounter for supervision of normal first pregnancy, unspecified trimester: Secondary | ICD-10-CM | POA: Diagnosis not present

## 2019-08-12 DIAGNOSIS — Z124 Encounter for screening for malignant neoplasm of cervix: Secondary | ICD-10-CM | POA: Diagnosis not present

## 2019-08-12 DIAGNOSIS — Z113 Encounter for screening for infections with a predominantly sexual mode of transmission: Secondary | ICD-10-CM | POA: Diagnosis not present

## 2019-08-30 DIAGNOSIS — Z3682 Encounter for antenatal screening for nuchal translucency: Secondary | ICD-10-CM | POA: Diagnosis not present

## 2019-10-07 DIAGNOSIS — Z3A18 18 weeks gestation of pregnancy: Secondary | ICD-10-CM | POA: Diagnosis not present

## 2019-10-07 DIAGNOSIS — Z361 Encounter for antenatal screening for raised alphafetoprotein level: Secondary | ICD-10-CM | POA: Diagnosis not present

## 2019-10-07 DIAGNOSIS — Z363 Encounter for antenatal screening for malformations: Secondary | ICD-10-CM | POA: Diagnosis not present

## 2019-10-13 ENCOUNTER — Other Ambulatory Visit: Payer: Self-pay | Admitting: Obstetrics & Gynecology

## 2019-10-13 DIAGNOSIS — O364XX0 Maternal care for intrauterine death, not applicable or unspecified: Secondary | ICD-10-CM

## 2019-10-18 ENCOUNTER — Encounter: Payer: Self-pay | Admitting: *Deleted

## 2019-10-20 ENCOUNTER — Other Ambulatory Visit: Payer: Self-pay

## 2019-10-20 ENCOUNTER — Ambulatory Visit (HOSPITAL_BASED_OUTPATIENT_CLINIC_OR_DEPARTMENT_OTHER): Payer: BC Managed Care – PPO

## 2019-10-20 ENCOUNTER — Ambulatory Visit: Payer: Self-pay

## 2019-10-20 ENCOUNTER — Ambulatory Visit: Payer: BC Managed Care – PPO | Attending: Obstetrics & Gynecology | Admitting: *Deleted

## 2019-10-20 ENCOUNTER — Ambulatory Visit: Payer: BC Managed Care – PPO

## 2019-10-20 VITALS — BP 137/84 | HR 104

## 2019-10-20 DIAGNOSIS — O358XX0 Maternal care for other (suspected) fetal abnormality and damage, not applicable or unspecified: Secondary | ICD-10-CM

## 2019-10-20 DIAGNOSIS — O3120X0 Continuing pregnancy after intrauterine death of one fetus or more, unspecified trimester, not applicable or unspecified: Secondary | ICD-10-CM | POA: Diagnosis not present

## 2019-10-20 DIAGNOSIS — O3412 Maternal care for benign tumor of corpus uteri, second trimester: Secondary | ICD-10-CM | POA: Insufficient documentation

## 2019-10-20 DIAGNOSIS — D259 Leiomyoma of uterus, unspecified: Secondary | ICD-10-CM | POA: Insufficient documentation

## 2019-10-20 DIAGNOSIS — O280 Abnormal hematological finding on antenatal screening of mother: Secondary | ICD-10-CM | POA: Diagnosis not present

## 2019-10-20 DIAGNOSIS — Z3A20 20 weeks gestation of pregnancy: Secondary | ICD-10-CM

## 2019-10-20 DIAGNOSIS — O364XX0 Maternal care for intrauterine death, not applicable or unspecified: Secondary | ICD-10-CM | POA: Diagnosis not present

## 2019-10-20 DIAGNOSIS — O3120X1 Continuing pregnancy after intrauterine death of one fetus or more, unspecified trimester, fetus 1: Secondary | ICD-10-CM | POA: Diagnosis not present

## 2019-10-20 DIAGNOSIS — Z363 Encounter for antenatal screening for malformations: Secondary | ICD-10-CM

## 2019-10-20 DIAGNOSIS — O3122X1 Continuing pregnancy after intrauterine death of one fetus or more, second trimester, fetus 1: Secondary | ICD-10-CM | POA: Insufficient documentation

## 2019-10-20 DIAGNOSIS — O43129 Velamentous insertion of umbilical cord, unspecified trimester: Secondary | ICD-10-CM | POA: Insufficient documentation

## 2019-10-20 DIAGNOSIS — O364XX1 Maternal care for intrauterine death, fetus 1: Secondary | ICD-10-CM | POA: Insufficient documentation

## 2019-10-20 DIAGNOSIS — Z362 Encounter for other antenatal screening follow-up: Secondary | ICD-10-CM

## 2019-10-20 DIAGNOSIS — O288 Other abnormal findings on antenatal screening of mother: Secondary | ICD-10-CM | POA: Diagnosis not present

## 2019-10-20 NOTE — Consult Note (Signed)
MFM Note  This patient was seen for a detailed fetal anatomy scan due to an elevated MSAFP of 3.10 MoM. This pregnancy initially started out as a twin pregnancy. Unfortunately, a demise of one twin was noted at around 11+ weeks. Due to the demise of one twin, she did not have a cell free DNA test drawn to screen for fetal aneuploidy. She denies any significant past medical history and denies any problems in her current pregnancy.  She was informed that the fetal growth and amniotic fluid level were appropriate for her gestational age. Multiple fibroids were noted throughout her uterus today. A velamentous cord insertion was also noted today. There were no signs of vasa previa noted.   The patient was advised of the ultrasound findings that failed to reveal an anatomical cause for the increased MSAFP.  There were no sonographic signs of spina bifida or an anterior abdominal wall defect noted today. She was advised that the elevated MSAFP level may have been a result of the fact that this pregnancy initially started out as a twin gestation or due to her fibroid uterus.  An intracardiac echogenic focus was noted in the left ventricle of the fetal heart on today's exam.  The small association between an echogenic focus and Down syndrome was discussed. The views of the fetal anatomy were limited today due to the fetal position.  She was advised regarding the limitations of ultrasound in the detection of all anomalies and that it will diagnose approximately 90% of neural tube defects.  The association of an elevated MSAFP with placental dysfunction which may manifest later in her pregnancy as fetal growth restriction along with with other adverse pregnancy outcomes such as fetal demise was discussed.  Due to the elevated MSAFP and the echogenic focus noted today, the patient was offered and declined an amniocentesis today for definitive diagnosis of fetal aneuploidy and spina bifida.  Due to the elevated  MSAFP, she should continue to be followed with growth ultrasounds every 4 to 5 weeks.  Weekly fetal testing should be started at around 32 weeks and continued until delivery.    The increased risk of maternal pain issues and fetal growth issues later in her pregnancy due to the fibroids was discussed.   A follow-up exam was scheduled in 4 weeks to assess the fetal growth and to complete the views of the fetal anatomy.  A total of 30 minutes was spent counseling and coordinating the care for this patient.

## 2019-10-21 ENCOUNTER — Other Ambulatory Visit: Payer: Self-pay | Admitting: *Deleted

## 2019-10-21 DIAGNOSIS — O285 Abnormal chromosomal and genetic finding on antenatal screening of mother: Secondary | ICD-10-CM

## 2019-11-04 ENCOUNTER — Other Ambulatory Visit: Payer: Self-pay

## 2019-11-04 ENCOUNTER — Encounter (HOSPITAL_COMMUNITY): Payer: Self-pay | Admitting: Obstetrics and Gynecology

## 2019-11-04 ENCOUNTER — Inpatient Hospital Stay (HOSPITAL_COMMUNITY)
Admission: AD | Admit: 2019-11-04 | Discharge: 2019-11-04 | Disposition: A | Discharge status: Home / Self Care | Payer: BC Managed Care – PPO | Attending: Obstetrics and Gynecology | Admitting: Obstetrics and Gynecology

## 2019-11-04 DIAGNOSIS — K5900 Constipation, unspecified: Secondary | ICD-10-CM | POA: Diagnosis not present

## 2019-11-04 DIAGNOSIS — Z79899 Other long term (current) drug therapy: Secondary | ICD-10-CM | POA: Insufficient documentation

## 2019-11-04 DIAGNOSIS — Z3A22 22 weeks gestation of pregnancy: Secondary | ICD-10-CM | POA: Insufficient documentation

## 2019-11-04 DIAGNOSIS — O99612 Diseases of the digestive system complicating pregnancy, second trimester: Secondary | ICD-10-CM | POA: Insufficient documentation

## 2019-11-04 DIAGNOSIS — O99891 Other specified diseases and conditions complicating pregnancy: Secondary | ICD-10-CM | POA: Diagnosis not present

## 2019-11-04 DIAGNOSIS — N898 Other specified noninflammatory disorders of vagina: Secondary | ICD-10-CM

## 2019-11-04 LAB — URINALYSIS, ROUTINE W REFLEX MICROSCOPIC
Bilirubin Urine: NEGATIVE
Glucose, UA: NEGATIVE mg/dL
Hgb urine dipstick: NEGATIVE
Ketones, ur: NEGATIVE mg/dL
Leukocytes,Ua: NEGATIVE
Nitrite: NEGATIVE
Protein, ur: NEGATIVE mg/dL
Specific Gravity, Urine: 1.003 — ABNORMAL LOW (ref 1.005–1.030)
pH: 6 (ref 5.0–8.0)

## 2019-11-04 LAB — WET PREP, GENITAL
Clue Cells Wet Prep HPF POC: NONE SEEN
Sperm: NONE SEEN
Trich, Wet Prep: NONE SEEN
Yeast Wet Prep HPF POC: NONE SEEN

## 2019-11-04 MED ORDER — POLYETHYLENE GLYCOL 3350 17 G PO PACK: 17.0000 g | PACK | Freq: Every day | ORAL | 0 refills | Status: DC

## 2019-11-04 NOTE — MAU Provider Note (Signed)
History     CSN: [id number]  Arrival date and time: 11/04/19 1556   First Provider Initiated Contact with Patient 11/04/19 1824      Chief Complaint  Patient presents with  . Abdominal Pain  . Rupture of Membranes   HPI  [name] is a 34 yo G1P0 at [redacted]w[redacted]d EGA who is presenting to MAU complaining of lower abdominal pressure and general abdominal discomfort for three days. She reports feeling a lot of pelvic pressure, and being very constipated. She only has a BM once every few days, and strains with them. She felt her abdominal discomfort and pain were relieved by the BM she had today. She also reports gas pain. She does not consume much fiber.  She is also concerned about an event today where she soaked through her underwear and pants. She reports leaking fluid that was a light yellow, as well as increased vaginal discharge. She has had no new sexual partners, and denies itching, burning, dysuria, or dyspareunia.  She is feeling her baby move, denies vaginal bleeding.  OB History    Gravida  1   Para  0   Term  0   Preterm  0   AB  0   Living  0     SAB  0   TAB  0   Ectopic  0   Multiple  0   Live Births  0           Past Medical History:  Diagnosis Date  . History of chicken pox   . HSV infection   . PCOS (polycystic ovarian syndrome)     Past Surgical History:  Procedure Laterality Date  . NO PAST SURGERIES      Family History  Problem Relation Age of Onset  . Cancer Mother        Cervical Cancer  . Hepatitis C Father   . Thyroid disease Maternal Aunt   . Thyroid disease Maternal Grandmother   . Stroke Neg Hx   . Hypertension Neg Hx   . Hyperlipidemia Neg Hx   . Early death Neg Hx     Social History   Tobacco Use  . Smoking status: Never Smoker  . Smokeless tobacco: Never Used  Vaping Use  . Vaping Use: Never used  Substance Use Topics  . Alcohol use: Not Currently    Alcohol/week: 1.0 standard drink    Types: 1 Glasses of wine per  week  . Drug use: No    Allergies: No Known Allergies  Medications Prior to Admission  Medication Sig Dispense Refill Last Dose  . Prenatal Vit w/Fe-Methylfol-FA (PNV PO) Take by mouth.   11/04/2019 at Unknown time  . benzoyl peroxide 5 % gel Apply topically daily. (Patient not taking: Reported on 10/20/2019) 45 g 0   . BIOTIN PO Take 1 tablet by mouth daily. 10,081mcg (Patient not taking: Reported on 10/20/2019)     . Multiple Vitamin (MULTIVITAMIN) tablet Take 1 tablet by mouth daily. (Patient not taking: Reported on 10/20/2019)     . Olopatadine HCl 0.2 % SOLN Apply 1 drop to eye every morning. (Patient not taking: Reported on 10/20/2019) 1 Bottle 0     Review of Systems  All other systems reviewed and are negative.  Physical Exam   Blood pressure 120/79, pulse 83, temperature 98.5 F (36.9 C), temperature source Oral, resp. rate 20, weight 85.2 kg, last menstrual period 06/03/2019.  Physical Exam Vitals and nursing note reviewed. Exam conducted with  a chaperone present.  Constitutional:      Appearance: She is well-developed.  HENT:     Head: Normocephalic.     Mouth/Throat:     Mouth: Mucous membranes are moist.     Pharynx: Oropharynx is clear.  Eyes:     Extraocular Movements: Extraocular movements intact.     Pupils: Pupils are equal, round, and reactive to light.  Cardiovascular:     Rate and Rhythm: Normal rate and regular rhythm.     Heart sounds: Normal heart sounds.  Pulmonary:     Effort: Pulmonary effort is normal.     Breath sounds: Normal breath sounds.  Abdominal:     General: Bowel sounds are normal.     Palpations: Abdomen is soft.     Tenderness: There is no abdominal tenderness.     Comments: Gravid, fundal height appropriate for gestational age  Genitourinary:    Vagina: Vaginal discharge (moderately thin, white, non-frothy) present.     Cervix: Normal.     Uterus: Normal.      Adnexa: Right adnexa normal and left adnexa normal.     Rectum: Normal.      Comments: Copious amounts of stool palpated in the rectal vault on pelvic exam Skin:    General: Skin is warm and dry.     Capillary Refill: Capillary refill takes 2 to 3 seconds.  Neurological:     General: No focal deficit present.     Mental Status: She is alert and oriented to person, place, and time.  Psychiatric:        Mood and Affect: Mood normal.        Behavior: Behavior normal.     MAU Course  Procedures  MDM - Wet prep, GC/CT - Likely discharge was urine - Most likely patient is constipated, recommended Miralax and 35 g fiber intake daily  Results for orders placed or performed during the hospital encounter of 11/04/19 (from the past 24 hour(s))  Urinalysis, Routine w reflex microscopic Urine, Clean Catch     Status: Abnormal   Collection Time: 11/04/19  5:21 PM  Result Value Ref Range   Color, Urine STRAW (A) YELLOW   APPearance CLEAR CLEAR   Specific Gravity, Urine 1.003 (L) 1.005 - 1.030   pH 6.0 5.0 - 8.0   Glucose, UA NEGATIVE NEGATIVE mg/dL   Hgb urine dipstick NEGATIVE NEGATIVE   Bilirubin Urine NEGATIVE NEGATIVE   Ketones, ur NEGATIVE NEGATIVE mg/dL   Protein, ur NEGATIVE NEGATIVE mg/dL   Nitrite NEGATIVE NEGATIVE   Leukocytes,Ua NEGATIVE NEGATIVE  Wet prep, genital     Status: Abnormal   Collection Time: 11/04/19  6:34 PM   Specimen: Cervix  Result Value Ref Range   Yeast Wet Prep HPF POC NONE SEEN NONE SEEN   Trich, Wet Prep NONE SEEN NONE SEEN   Clue Cells Wet Prep HPF POC NONE SEEN NONE SEEN   WBC, Wet Prep HPF POC MANY (A) NONE SEEN   Sperm NONE SEEN     Assessment and Plan  34 yo G1P0 at [redacted]w[redacted]d with complaints of constipation and vaginal discharge - Vaginal discharge likely urine, Wet Prep normal. GC/CT pending - Will send home with Miralax for constipation - DC to home with strict return precautions  Ketty Bitton L Harjit Douds 11/04/2019, 6:35 PM

## 2019-11-04 NOTE — MAU Note (Signed)
Pt has been having lower abdominal pressure and some pain for 3 days.  Reports that she has had a BM today and the pain has improved, but still feeling constant lower abdominal pressure.  While at work today, had some discharge that was clear/yellow that got her underwear and pants wet.

## 2019-11-04 NOTE — Discharge Instructions (Signed)
Eating Plan for Pregnant Women While you are pregnant, your body requires additional nutrition to help support your growing baby. You also have a higher need for some vitamins and minerals, such as folic acid, calcium, iron, and vitamin D. Eating a healthy, well-balanced diet is very important for your health and your baby's health. Your need for extra calories varies for the three 80-monthsegments of your pregnancy (trimesters). For most women, it is recommended to consume:  150 extra calories a day during the first trimester.  300 extra calories a day during the second trimester.  300 extra calories a day during the third trimester. What are tips for following this plan?   Do not try to lose weight or go on a diet during pregnancy.  Limit your overall intake of foods that have "empty calories." These are foods that have little nutritional value, such as sweets, desserts, candies, and sugar-sweetened beverages.  Eat a variety of foods (especially fruits and vegetables) to get a full range of vitamins and minerals.  Take a prenatal vitamin to help meet your additional vitamin and mineral needs during pregnancy, specifically for folic acid, iron, calcium, and vitamin D.  Remember to stay active. Ask your health care provider what types of exercise and activities are safe for you.  Practice good food safety and cleanliness. Wash your hands before you eat and after you prepare raw meat. Wash all fruits and vegetables well before peeling or eating. Taking these actions can help to prevent food-borne illnesses that can be very dangerous to your baby, such as listeriosis. Ask your health care provider for more information about listeriosis. What does 150 extra calories look like? Healthy options that provide 150 extra calories each day could be any of the following:  6-8 oz (170-230 g) of plain low-fat yogurt with  cup of berries.  1 apple with 2 teaspoons (11 g) of peanut butter.  Cut-up  vegetables with  cup (60 g) of hummus.  8 oz (230 mL) or 1 cup of low-fat chocolate milk.  1 stick of string cheese with 1 medium orange.  1 peanut butter and jelly sandwich that is made with one slice of whole-wheat bread and 1 tsp (5 g) of peanut butter. For 300 extra calories, you could eat two of those healthy options each day. What is a healthy amount of weight to gain? The right amount of weight gain for you is based on your BMI before you became pregnant. If your BMI:  Was less than 18 (underweight), you should gain 28-40 lb (13-18 kg).  Was 18-24.9 (normal), you should gain 25-35 lb (11-16 kg).  Was 25-29.9 (overweight), you should gain 15-25 lb (7-11 kg).  Was 30 or greater (obese), you should gain 11-20 lb (5-9 kg). What if I am having twins or multiples? Generally, if you are carrying twins or multiples:  You may need to eat 300-600 extra calories a day.  The recommended range for total weight gain is 25-54 lb (11-25 kg), depending on your BMI before pregnancy.  Talk with your health care provider to find out about nutritional needs, weight gain, and exercise that is right for you. What foods can I eat?  Fruits All fruits. Eat a variety of colors and types of fruit. Remember to wash your fruits well before peeling or eating. Vegetables All vegetables. Eat a variety of colors and types of vegetables. Remember to wash your vegetables well before peeling or eating. Grains All grains. Choose whole grains, such  as whole-wheat bread, oatmeal, or brown rice. Meats and other protein foods Lean meats, including chicken, [place], fish, and lean cuts of beef, veal, or pork. If you eat fish or seafood, choose options that are higher in omega-3 fatty acids and lower in mercury, such as salmon, herring, mussels, trout, sardines, pollock, shrimp, crab, and lobster. Tofu. Tempeh. Beans. Eggs. Peanut butter and other nut butters. Make sure that all meats, poultry, and eggs are cooked to  food-safe temperatures or "well-done." Two or more servings of fish are recommended each week in order to get the most benefits from omega-3 fatty acids that are found in seafood. Choose fish that are lower in mercury. You can find more information online:  [employer]Gaming.ch Dairy Pasteurized milk and milk alternatives (such as almond milk). Pasteurized yogurt and pasteurized cheese. Cottage cheese. Sour cream. Beverages Water. Juices that contain 100% fruit juice or vegetable juice. Caffeine-free teas and decaffeinated coffee. Drinks that contain caffeine are okay to drink, but it is better to avoid caffeine. Keep your total caffeine intake to less than 200 mg each day (which is 12 oz or 355 mL of coffee, tea, or soda) or the limit as told by your health care provider. Fats and oils Fats and oils are okay to include in moderation. Sweets and desserts Sweets and desserts are okay to include in moderation. Seasoning and other foods All pasteurized condiments. The items listed above may not be a complete list of foods and beverages you can eat. Contact a dietitian for more information. What foods are not recommended? Fruits Unpasteurized fruit juices. Vegetables Raw (unpasteurized) vegetable juices. Meats and other protein foods Lunch meats, bologna, hot dogs, or other deli meats. (If you must eat those meats, reheat them until they are steaming hot.) Refrigerated pat, meat spreads from a meat counter, smoked seafood that is found in the refrigerated section of a store. Raw or undercooked meats, poultry, and eggs. Raw fish, such as sushi or sashimi. Fish that have high mercury content, such as tilefish, shark, swordfish, and king mackerel. To learn more about mercury in fish, talk with your health care provider or look for online resources, such as:  [place]Gaming.ch Dairy Raw (unpasteurized) milk and any foods that have raw milk in them. Soft cheeses, such as feta, queso blanco, queso fresco, Brie,  Camembert cheeses, blue-veined cheeses, and Panela cheese (unless it is made with pasteurized milk, which must be stated on the label). Beverages Alcohol. Sugar-sweetened beverages, such as sodas, teas, or energy drinks. Seasoning and other foods Homemade fermented foods and drinks, such as pickles, sauerkraut, or kombucha drinks. (Store-bought pasteurized versions of these are okay.) Salads that are made in a store or deli, such as ham salad, chicken salad, egg salad, tuna salad, and seafood salad. The items listed above may not be a complete list of foods and beverages you should avoid. Contact a dietitian for more information. Where to find more information To calculate the number of calories you need based on your height, weight, and activity level, you can use an online calculator such as:  MobileTransition.ch To calculate how much weight you should gain during pregnancy, you can use an online pregnancy weight gain calculator such as:  StreamingFood.com.cy Summary  While you are pregnant, your body requires additional nutrition to help support your growing baby.  Eat a variety of foods, especially fruits and vegetables to get a full range of vitamins and minerals.  Practice good food safety and cleanliness. Wash your hands before you eat  and after you prepare raw meat. Wash all fruits and vegetables well before peeling or eating. Taking these actions can help to prevent food-borne illnesses, such as listeriosis, that can be very dangerous to your baby.  Do not eat raw meat or fish. Do not eat fish that have high mercury content, such as tilefish, shark, swordfish, and king mackerel. Do not eat unpasteurized (raw) dairy.  Take a prenatal vitamin to help meet your additional vitamin and mineral needs during pregnancy, specifically for folic acid, iron, calcium, and vitamin D. This information is not intended to replace advice given to you by  your health care provider. Make sure you discuss any questions you have with your health care provider. Document Revised: 06/11/2018 Document Reviewed: 10/18/2016 Elsevier Patient Education  [place].

## 2019-11-05 LAB — GC/CHLAMYDIA PROBE AMP (~~LOC~~) NOT AT ARMC
Chlamydia: NEGATIVE
Comment: NEGATIVE
Comment: NORMAL
Neisseria Gonorrhea: NEGATIVE

## 2019-11-17 ENCOUNTER — Other Ambulatory Visit: Payer: Self-pay

## 2019-11-17 ENCOUNTER — Ambulatory Visit: Payer: BC Managed Care – PPO | Attending: Obstetrics and Gynecology

## 2019-11-17 ENCOUNTER — Ambulatory Visit: Payer: BC Managed Care – PPO | Admitting: *Deleted

## 2019-11-17 VITALS — BP 117/69 | HR 83

## 2019-11-17 DIAGNOSIS — O3412 Maternal care for benign tumor of corpus uteri, second trimester: Secondary | ICD-10-CM

## 2019-11-17 DIAGNOSIS — O280 Abnormal hematological finding on antenatal screening of mother: Secondary | ICD-10-CM

## 2019-11-17 DIAGNOSIS — O43129 Velamentous insertion of umbilical cord, unspecified trimester: Secondary | ICD-10-CM | POA: Diagnosis not present

## 2019-11-17 DIAGNOSIS — O285 Abnormal chromosomal and genetic finding on antenatal screening of mother: Secondary | ICD-10-CM | POA: Diagnosis not present

## 2019-11-17 DIAGNOSIS — D259 Leiomyoma of uterus, unspecified: Secondary | ICD-10-CM

## 2019-11-17 DIAGNOSIS — O3122X2 Continuing pregnancy after intrauterine death of one fetus or more, second trimester, fetus 2: Secondary | ICD-10-CM

## 2019-11-17 DIAGNOSIS — O289 Unspecified abnormal findings on antenatal screening of mother: Secondary | ICD-10-CM | POA: Diagnosis not present

## 2019-11-17 DIAGNOSIS — O358XX0 Maternal care for other (suspected) fetal abnormality and damage, not applicable or unspecified: Secondary | ICD-10-CM

## 2019-11-17 DIAGNOSIS — Z362 Encounter for other antenatal screening follow-up: Secondary | ICD-10-CM

## 2019-11-17 DIAGNOSIS — Z3A24 24 weeks gestation of pregnancy: Secondary | ICD-10-CM

## 2019-12-06 DIAGNOSIS — Z3481 Encounter for supervision of other normal pregnancy, first trimester: Secondary | ICD-10-CM | POA: Diagnosis not present

## 2019-12-06 DIAGNOSIS — Z361 Encounter for antenatal screening for raised alphafetoprotein level: Secondary | ICD-10-CM | POA: Diagnosis not present

## 2019-12-06 DIAGNOSIS — Z23 Encounter for immunization: Secondary | ICD-10-CM | POA: Diagnosis not present

## 2019-12-13 DIAGNOSIS — O99810 Abnormal glucose complicating pregnancy: Secondary | ICD-10-CM | POA: Diagnosis not present

## 2019-12-29 DIAGNOSIS — O43123 Velamentous insertion of umbilical cord, third trimester: Secondary | ICD-10-CM | POA: Diagnosis not present

## 2019-12-29 DIAGNOSIS — Z3A30 30 weeks gestation of pregnancy: Secondary | ICD-10-CM | POA: Diagnosis not present

## 2020-01-11 DIAGNOSIS — O99891 Other specified diseases and conditions complicating pregnancy: Secondary | ICD-10-CM | POA: Diagnosis not present

## 2020-01-11 DIAGNOSIS — Z3A32 32 weeks gestation of pregnancy: Secondary | ICD-10-CM | POA: Diagnosis not present

## 2020-01-18 DIAGNOSIS — Z3A33 33 weeks gestation of pregnancy: Secondary | ICD-10-CM | POA: Diagnosis not present

## 2020-01-18 DIAGNOSIS — O99891 Other specified diseases and conditions complicating pregnancy: Secondary | ICD-10-CM | POA: Diagnosis not present

## 2020-01-20 DIAGNOSIS — Z3A33 33 weeks gestation of pregnancy: Secondary | ICD-10-CM | POA: Diagnosis not present

## 2020-01-20 DIAGNOSIS — O368330 Maternal care for abnormalities of the fetal heart rate or rhythm, third trimester, not applicable or unspecified: Secondary | ICD-10-CM | POA: Diagnosis not present

## 2020-01-24 DIAGNOSIS — O43123 Velamentous insertion of umbilical cord, third trimester: Secondary | ICD-10-CM | POA: Diagnosis not present

## 2020-01-24 DIAGNOSIS — Z3A33 33 weeks gestation of pregnancy: Secondary | ICD-10-CM | POA: Diagnosis not present

## 2020-02-01 DIAGNOSIS — Z3A35 35 weeks gestation of pregnancy: Secondary | ICD-10-CM | POA: Diagnosis not present

## 2020-02-01 DIAGNOSIS — O99891 Other specified diseases and conditions complicating pregnancy: Secondary | ICD-10-CM | POA: Diagnosis not present

## 2020-02-05 NOTE — L&D Delivery Note (Signed)
Delivery Note At 7:19 PM a viable female was delivered via Vaginal, Spontaneous (Presentation: Right Occiput Anterior).  Meconium fluid noted at time of delivery. APGAR: 8, 9; weight  .   Placenta status: Spontaneous;Pathology, Intact.  Cord: 3 vessels with the following complications: Velamentous.  Cord pH: arterial blood gas collected. Pregnancy also with known 11 week demised twin A, firm white tissue noted with delivery of placenta noted that is consistent with this history.  Anesthesia: Epidural Episiotomy: None Lacerations: 3rd degree Suture Repair: 2.0 vicryl Est. Blood Loss (mL):  300 cc  Mom to postpartum.  Baby to Couplet care / Skin to Skin.  Carlyon Shadow 02/29/2020, 7:55 PM

## 2020-02-08 DIAGNOSIS — Z3685 Encounter for antenatal screening for Streptococcus B: Secondary | ICD-10-CM | POA: Diagnosis not present

## 2020-02-08 DIAGNOSIS — Z3A36 36 weeks gestation of pregnancy: Secondary | ICD-10-CM | POA: Diagnosis not present

## 2020-02-08 DIAGNOSIS — O368330 Maternal care for abnormalities of the fetal heart rate or rhythm, third trimester, not applicable or unspecified: Secondary | ICD-10-CM | POA: Diagnosis not present

## 2020-02-15 DIAGNOSIS — O26893 Other specified pregnancy related conditions, third trimester: Secondary | ICD-10-CM | POA: Diagnosis not present

## 2020-02-15 DIAGNOSIS — Z3A37 37 weeks gestation of pregnancy: Secondary | ICD-10-CM | POA: Diagnosis not present

## 2020-02-16 ENCOUNTER — Telehealth (HOSPITAL_COMMUNITY): Payer: Self-pay | Admitting: *Deleted

## 2020-02-16 ENCOUNTER — Encounter (HOSPITAL_COMMUNITY): Payer: Self-pay | Admitting: *Deleted

## 2020-02-16 NOTE — Telephone Encounter (Signed)
Preadmission screen  

## 2020-02-22 DIAGNOSIS — O99891 Other specified diseases and conditions complicating pregnancy: Secondary | ICD-10-CM | POA: Diagnosis not present

## 2020-02-22 DIAGNOSIS — Z3A38 38 weeks gestation of pregnancy: Secondary | ICD-10-CM | POA: Diagnosis not present

## 2020-02-28 ENCOUNTER — Other Ambulatory Visit (HOSPITAL_COMMUNITY)
Admission: RE | Admit: 2020-02-28 | Discharge: 2020-02-28 | Disposition: A | Discharge status: Home / Self Care | Payer: BC Managed Care – PPO | Source: Ambulatory Visit | Attending: Obstetrics and Gynecology | Admitting: Obstetrics and Gynecology

## 2020-02-28 DIAGNOSIS — S3141XD Laceration without foreign body of vagina and vulva, subsequent encounter: Secondary | ICD-10-CM | POA: Diagnosis not present

## 2020-02-28 DIAGNOSIS — Z01812 Encounter for preprocedural laboratory examination: Secondary | ICD-10-CM | POA: Insufficient documentation

## 2020-02-28 DIAGNOSIS — O43813 Placental infarction, third trimester: Secondary | ICD-10-CM | POA: Diagnosis not present

## 2020-02-28 DIAGNOSIS — Z20822 Contact with and (suspected) exposure to covid-19: Secondary | ICD-10-CM | POA: Insufficient documentation

## 2020-02-28 DIAGNOSIS — O43123 Velamentous insertion of umbilical cord, third trimester: Secondary | ICD-10-CM | POA: Diagnosis not present

## 2020-02-28 DIAGNOSIS — Z412 Encounter for routine and ritual male circumcision: Secondary | ICD-10-CM | POA: Diagnosis not present

## 2020-02-28 DIAGNOSIS — O3101X1 Papyraceous fetus, first trimester, fetus 1: Secondary | ICD-10-CM | POA: Diagnosis not present

## 2020-02-28 DIAGNOSIS — Z3A39 39 weeks gestation of pregnancy: Secondary | ICD-10-CM | POA: Diagnosis not present

## 2020-02-28 DIAGNOSIS — O26893 Other specified pregnancy related conditions, third trimester: Secondary | ICD-10-CM | POA: Diagnosis not present

## 2020-02-28 LAB — SARS CORONAVIRUS 2 (TAT 6-24 HRS): SARS Coronavirus 2: NEGATIVE

## 2020-02-28 NOTE — H&P (Signed)
OB History and Physical   [name] is a 35 y.o. female G1P0 presenting for IOL at [redacted]w[redacted]d. Pregnancy history is notable for previous twin gestation with demise of twin A at ~11 weeks.  Further, ultrasound indicated valementous cord insertion as well as EIF and she was referred to MFM.  Labs were notable for elevated AFP. There were no findings on [place] to explain elevated AFP, and therefore may be due to twin gestation.  Fetal growth and antenatal testing has been appropriate.  GBS negative. Cervix most recently 1/50/-3 in office.    OB History    Gravida  1   Para  0   Term  0   Preterm  0   AB  0   Living  0     SAB  0   IAB  0   Ectopic  0   Multiple  0   Live Births  0          Past Medical History:  Diagnosis Date  . History of chicken pox   . HSV infection   . PCOS (polycystic ovarian syndrome)   . Velamentous insertion of umbilical cord    Past Surgical History:  Procedure Laterality Date  . NO PAST SURGERIES     Family History: family history includes Cancer in her mother; Hepatitis C in her father; Thyroid disease in her maternal aunt and maternal grandmother. Social History:  reports that she has never smoked. She has never used smokeless tobacco. She reports previous alcohol use of about 1.0 standard drink of alcohol per week. She reports that she does not use drugs.     Maternal Diabetes: No Genetic Screening: see HPI Maternal Ultrasounds/Referrals: Other: MFM [place] for velamentous cord insert, elevated AFP, twin demise at 80 weeks Maternal Substance Abuse:  No Significant Maternal Medications:  None Significant Maternal Lab Results:  Group B Strep negative Other Comments:  None  Review of Systems History   Last menstrual period 06/03/2019. Exam Physical Exam  Prenatal labs: ABO, Rh:   negative (received rhogam 12/05/2020 Antibody:   neg Rubella:   RI RPR:    NR HBsAg:   Neg HIV:    Neg GBS:   Neg  Assessment/Plan: . Admit to Labor  and Delivery. . Pregnancy with known 11 week twin A demise.  Elevated AFP, EIF, and velamentous cord insertion with no evidence of vasa previa.  Serial growth and antenatal testing has been appropriate.  . Cytotec for cervical ripening, followed by pitocin, AROM  . Epidural when desired    Carlyon Shadow 02/28/2020, 10:22 AM

## 2020-02-29 ENCOUNTER — Inpatient Hospital Stay (HOSPITAL_COMMUNITY)
Admission: AD | Admit: 2020-02-29 | Discharge: 2020-03-03 | DRG: 768 | Disposition: A | Discharge status: Home / Self Care | Payer: BC Managed Care – PPO | Attending: Obstetrics and Gynecology | Admitting: Obstetrics and Gynecology

## 2020-02-29 ENCOUNTER — Inpatient Hospital Stay (HOSPITAL_COMMUNITY): Payer: BC Managed Care – PPO | Admitting: Anesthesiology

## 2020-02-29 ENCOUNTER — Other Ambulatory Visit: Payer: Self-pay

## 2020-02-29 ENCOUNTER — Inpatient Hospital Stay (HOSPITAL_COMMUNITY): Payer: BC Managed Care – PPO

## 2020-02-29 ENCOUNTER — Encounter (HOSPITAL_COMMUNITY): Payer: Self-pay | Admitting: Obstetrics and Gynecology

## 2020-02-29 DIAGNOSIS — Z20822 Contact with and (suspected) exposure to covid-19: Secondary | ICD-10-CM | POA: Diagnosis present

## 2020-02-29 DIAGNOSIS — Z3A39 39 weeks gestation of pregnancy: Secondary | ICD-10-CM | POA: Diagnosis not present

## 2020-02-29 DIAGNOSIS — O26893 Other specified pregnancy related conditions, third trimester: Secondary | ICD-10-CM | POA: Diagnosis present

## 2020-02-29 DIAGNOSIS — Z3490 Encounter for supervision of normal pregnancy, unspecified, unspecified trimester: Secondary | ICD-10-CM

## 2020-02-29 DIAGNOSIS — O43123 Velamentous insertion of umbilical cord, third trimester: Principal | ICD-10-CM | POA: Diagnosis present

## 2020-02-29 LAB — CBC
HCT: 34.8 % — ABNORMAL LOW (ref 36.0–46.0)
Hemoglobin: 12.2 g/dL (ref 12.0–15.0)
MCH: 32.1 pg (ref 26.0–34.0)
MCHC: 35.1 g/dL (ref 30.0–36.0)
MCV: 91.6 fL (ref 80.0–100.0)
Platelets: 245 10*3/uL (ref 150–400)
RBC: 3.8 MIL/uL — ABNORMAL LOW (ref 3.87–5.11)
RDW: 13.3 % (ref 11.5–15.5)
WBC: 12 10*3/uL — ABNORMAL HIGH (ref 4.0–10.5)
nRBC: 0 % (ref 0.0–0.2)

## 2020-02-29 LAB — RPR: RPR Ser Ql: NONREACTIVE

## 2020-02-29 LAB — TYPE AND SCREEN
ABO/RH(D): A NEG
Antibody Screen: POSITIVE

## 2020-02-29 MED ORDER — EPHEDRINE 5 MG/ML INJ: 10.0000 mg | INTRAVENOUS | Status: DC | PRN

## 2020-02-29 MED ORDER — OXYTOCIN-SODIUM CHLORIDE 30-0.9 UT/500ML-% IV SOLN
1.0000 m[IU]/min | INTRAVENOUS | Status: DC
  Administered 2020-02-29: 2 m[IU]/min via INTRAVENOUS
  Filled 2020-02-29: qty 500

## 2020-02-29 MED ORDER — PHENYLEPHRINE 40 MCG/ML (10ML) SYRINGE FOR IV PUSH (FOR BLOOD PRESSURE SUPPORT): 80.0000 ug | PREFILLED_SYRINGE | INTRAVENOUS | Status: DC | PRN

## 2020-02-29 MED ORDER — OXYCODONE-ACETAMINOPHEN 5-325 MG PO TABS: 1.0000 | ORAL_TABLET | ORAL | Status: DC | PRN

## 2020-02-29 MED ORDER — SOD CITRATE-CITRIC ACID 500-334 MG/5ML PO SOLN: 30.0000 mL | ORAL | Status: DC | PRN

## 2020-02-29 MED ORDER — FENTANYL-BUPIVACAINE-NACL 0.5-0.125-0.9 MG/250ML-% EP SOLN
12.0000 mL/h | EPIDURAL | Status: DC | PRN
  Administered 2020-02-29: 12 mL/h via EPIDURAL
  Filled 2020-02-29: qty 250

## 2020-02-29 MED ORDER — POLYETHYLENE GLYCOL 3350 17 G PO PACK
17.0000 g | PACK | Freq: Every day | ORAL | Status: DC
  Administered 2020-03-01: 17 g via ORAL
  Filled 2020-02-29: qty 1

## 2020-02-29 MED ORDER — LACTATED RINGERS IV SOLN: 500.0000 mL | INTRAVENOUS | Status: DC | PRN

## 2020-02-29 MED ORDER — HYDROXYZINE HCL 50 MG PO TABS: 50.0000 mg | ORAL_TABLET | Freq: Four times a day (QID) | ORAL | Status: DC | PRN

## 2020-02-29 MED ORDER — ACETAMINOPHEN 325 MG PO TABS
650.0000 mg | ORAL_TABLET | ORAL | Status: DC | PRN
  Administered 2020-03-01: 650 mg via ORAL
  Filled 2020-02-29: qty 2

## 2020-02-29 MED ORDER — TERBUTALINE SULFATE 1 MG/ML IJ SOLN: 0.2500 mg | Freq: Once | INTRAMUSCULAR | Status: DC | PRN

## 2020-02-29 MED ORDER — IBUPROFEN 600 MG PO TABS
600.0000 mg | ORAL_TABLET | Freq: Four times a day (QID) | ORAL | Status: DC
  Administered 2020-02-29 – 2020-03-01 (×4): 600 mg via ORAL
  Filled 2020-02-29 (×4): qty 1

## 2020-02-29 MED ORDER — LIDOCAINE HCL (PF) 1 % IJ SOLN: 30.0000 mL | INTRAMUSCULAR | Status: DC | PRN

## 2020-02-29 MED ORDER — ONDANSETRON HCL 4 MG PO TABS: 4.0000 mg | ORAL_TABLET | ORAL | Status: DC | PRN

## 2020-02-29 MED ORDER — LACTATED RINGERS IV SOLN
500.0000 mL | Freq: Once | INTRAVENOUS | Status: AC
  Administered 2020-02-29: 500 mL via INTRAVENOUS

## 2020-02-29 MED ORDER — BENZOCAINE-MENTHOL 20-0.5 % EX AERO
1.0000 "application " | INHALATION_SPRAY | CUTANEOUS | Status: DC | PRN
  Administered 2020-02-29: 1 via TOPICAL
  Filled 2020-02-29: qty 56

## 2020-02-29 MED ORDER — ONDANSETRON HCL 4 MG/2ML IJ SOLN: 4.0000 mg | INTRAMUSCULAR | Status: DC | PRN

## 2020-02-29 MED ORDER — BUTORPHANOL TARTRATE 1 MG/ML IJ SOLN: 1.0000 mg | INTRAMUSCULAR | Status: DC | PRN

## 2020-02-29 MED ORDER — ONDANSETRON HCL 4 MG/2ML IJ SOLN: 4.0000 mg | Freq: Four times a day (QID) | INTRAMUSCULAR | Status: DC | PRN

## 2020-02-29 MED ORDER — COCONUT OIL OIL: 1.0000 "application " | TOPICAL_OIL | Status: DC | PRN

## 2020-02-29 MED ORDER — LIDOCAINE HCL (PF) 1 % IJ SOLN
INTRAMUSCULAR | Status: DC | PRN
  Administered 2020-02-29: 11 mL via EPIDURAL

## 2020-02-29 MED ORDER — WITCH HAZEL-GLYCERIN EX PADS: 1.0000 "application " | MEDICATED_PAD | CUTANEOUS | Status: DC | PRN

## 2020-02-29 MED ORDER — OXYTOCIN BOLUS FROM INFUSION
333.0000 mL | Freq: Once | INTRAVENOUS | Status: AC
  Administered 2020-02-29: 333 mL via INTRAVENOUS

## 2020-02-29 MED ORDER — DIPHENHYDRAMINE HCL 25 MG PO CAPS: 25.0000 mg | ORAL_CAPSULE | Freq: Four times a day (QID) | ORAL | Status: DC | PRN

## 2020-02-29 MED ORDER — DIPHENHYDRAMINE HCL 50 MG/ML IJ SOLN
12.5000 mg | INTRAMUSCULAR | Status: DC | PRN
Start: 2020-02-29 — End: 2020-02-29

## 2020-02-29 MED ORDER — TETANUS-DIPHTH-ACELL PERTUSSIS 5-2.5-18.5 LF-MCG/0.5 IM SUSY: 0.5000 mL | PREFILLED_SYRINGE | Freq: Once | INTRAMUSCULAR | Status: DC

## 2020-02-29 MED ORDER — SIMETHICONE 80 MG PO CHEW: 80.0000 mg | CHEWABLE_TABLET | ORAL | Status: DC | PRN

## 2020-02-29 MED ORDER — DIBUCAINE (PERIANAL) 1 % EX OINT: 1.0000 "application " | TOPICAL_OINTMENT | CUTANEOUS | Status: DC | PRN

## 2020-02-29 MED ORDER — OXYCODONE-ACETAMINOPHEN 5-325 MG PO TABS: 2.0000 | ORAL_TABLET | ORAL | Status: DC | PRN

## 2020-02-29 MED ORDER — ACETAMINOPHEN 325 MG PO TABS: 650.0000 mg | ORAL_TABLET | ORAL | Status: DC | PRN

## 2020-02-29 MED ORDER — OXYTOCIN-SODIUM CHLORIDE 30-0.9 UT/500ML-% IV SOLN: 2.5000 [IU]/h | INTRAVENOUS | Status: DC

## 2020-02-29 MED ORDER — LACTATED RINGERS IV SOLN: INTRAVENOUS | Status: DC

## 2020-02-29 MED ORDER — PRENATAL MULTIVITAMIN CH
1.0000 | ORAL_TABLET | Freq: Every day | ORAL | Status: DC
  Administered 2020-03-01: 1 via ORAL
  Filled 2020-02-29: qty 1

## 2020-02-29 MED ORDER — DOCUSATE SODIUM 100 MG PO CAPS
100.0000 mg | ORAL_CAPSULE | Freq: Two times a day (BID) | ORAL | Status: DC
  Administered 2020-03-01: 100 mg via ORAL
  Filled 2020-02-29: qty 1

## 2020-02-29 MED ORDER — MISOPROSTOL 25 MCG QUARTER TABLET
25.0000 ug | ORAL_TABLET | ORAL | Status: DC | PRN
  Administered 2020-02-29 (×2): 25 ug via VAGINAL
  Filled 2020-02-29 (×2): qty 1

## 2020-02-29 MED ORDER — SENNOSIDES-DOCUSATE SODIUM 8.6-50 MG PO TABS
2.0000 | ORAL_TABLET | ORAL | Status: DC
  Administered 2020-03-01: 2 via ORAL
  Filled 2020-02-29: qty 2

## 2020-02-29 MED ORDER — ZOLPIDEM TARTRATE 5 MG PO TABS: 5.0000 mg | ORAL_TABLET | Freq: Every evening | ORAL | Status: DC | PRN

## 2020-02-29 NOTE — Anesthesia Preprocedure Evaluation (Signed)
Anesthesia Evaluation  Patient identified by MRN, date of birth, ID band Patient awake    Reviewed: Allergy & Precautions, H&P , NPO status , Patient's Chart, lab work & pertinent test results  Airway Mallampati: II  TM Distance: >3 FB Neck ROM: Full    Dental no notable dental hx.    Pulmonary neg pulmonary ROS,    Pulmonary exam normal breath sounds clear to auscultation       Cardiovascular negative cardio ROS Normal cardiovascular exam Rhythm:Regular Rate:Normal     Neuro/Psych negative neurological ROS  negative psych ROS   GI/Hepatic negative GI ROS, Neg liver ROS,   Endo/Other  negative endocrine ROS  Renal/GU negative Renal ROS  negative genitourinary   Musculoskeletal negative musculoskeletal ROS (+)   Abdominal (+) + obese,   Peds negative pediatric ROS (+)  Hematology negative hematology ROS (+)   Anesthesia Other Findings   Reproductive/Obstetrics (+) Pregnancy                             Anesthesia Physical Anesthesia Plan  ASA: II  Anesthesia Plan: Epidural   Post-op Pain Management:    Induction: Intravenous  PONV Risk Score and Plan:   Airway Management Planned:   Additional Equipment:   Intra-op Plan:   Post-operative Plan: Extubation in OR  Informed Consent: I have reviewed the patients History and Physical, chart, labs and discussed the procedure including the risks, benefits and alternatives for the proposed anesthesia with the patient or authorized representative who has indicated his/her understanding and acceptance.     Dental advisory given  Plan Discussed with: CRNA  Anesthesia Plan Comments:         Anesthesia Quick Evaluation

## 2020-02-29 NOTE — Lactation Note (Signed)
This note was copied from a baby's chart. Lactation Consultation Note Baby 3 hrs old. Attempted to latch. Mom has been unable to d/t flat non-compressible nipples. Reverse pressure not very helpful. Shells given to wear in am. RN set up DEBP.  Fitted w/#20 NS. Baby latched w/a few suckles for about 1 minutes of 10 minutes at the breast. Baby not very interested. Newborn feeding habits, behavior, STS, I&O, positioning, support, supply and demand discussed. Mom encouraged to feed baby 8-12 times/24 hours and with feeding cues.   Encouraged mom to call for assistance or questions. Mom's breast are heavy, not very compressible, no colostrum noted at this time. Lactation brochure given.  Patient Name: [name] [id number] Date: 02/29/2020 Reason for consult: Initial assessment;Primapara;Term Age:46 hours  Maternal Data Has patient been taught Hand Expression?: Yes Does the patient have breastfeeding experience prior to this delivery?: No  Feeding Feeding Type: Breast Fed  LATCH Score Latch: Too sleepy or reluctant, no latch achieved, no sucking elicited.  Audible Swallowing: None  Type of Nipple: Flat  Comfort (Breast/Nipple): Filling, red/small blisters or bruises, mild/mod discomfort (edema to breast)  Hold (Positioning): Full assist, staff holds infant at breast  LATCH Score: 2  Interventions Interventions: Breast feeding basics reviewed;Adjust position;Assisted with latch;Support pillows;Skin to skin;Position options;Breast massage;Hand express;Shells;Reverse pressure;Breast compression  Lactation Tools Discussed/Used WIC Program: No Pump Education: Setup, frequency, and cleaning Initiated by:: RN Date initiated:: 02/29/20   Consult Status Consult Status: Follow-up Date: 03/01/20 Follow-up type: In-patient    Theodoro Kalata 02/29/2020, 11:10 PM

## 2020-02-29 NOTE — Progress Notes (Signed)
Labor Progress Note  At bedside to check on patient. Recently with run of recurrent variable vs early decels.  Stopped pitocin and did position changes, now with improvement.  Variables resolved, possible subtle early decels but otherwise moderate  Variability and accels present. Cervix continues to make change, now 8/80/0 per RN check.  Hopeful for continued progression to complete. Anticipate vaginal delivery.  Alpha Gula MD

## 2020-02-29 NOTE — Progress Notes (Signed)
FHT improved. Only decels are occasional subtle early decels.  Moderate variability and multiple accels present.

## 2020-02-29 NOTE — Progress Notes (Addendum)
At bedside.  FHT has been reassuring this afternoon.  Recently with small early and variable decels intermittently.  Now resolved with position change.  + accels and moderate variability continue.  Cervix now 9.5 with anterior lip. Station +1. Cervical lip is partially reducible but has some edema.    Will continue current management.  FHT is intermittently cat II but patient making change.  Suspect small variables related to cord compression of velamentous cord. Baby tolerates patient flat on back best. Will continue this position.   Will recheck for complete dilation and push when able. Discussed in detail with patient and family and all questions answered.   Alpha Gula MD

## 2020-02-29 NOTE — Anesthesia Procedure Notes (Signed)
Epidural Patient location during procedure: OB Start time: 02/29/2020 11:08 AM End time: 02/29/2020 11:23 AM  Staffing Anesthesiologist: Lynda Rainwater, MD Performed: anesthesiologist   Preanesthetic Checklist Completed: patient identified, IV checked, site marked, risks and benefits discussed, surgical consent, monitors and equipment checked, pre-op evaluation and timeout performed  Epidural Patient position: sitting Prep: ChloraPrep Patient monitoring: heart rate, cardiac monitor, continuous pulse ox and blood pressure Approach: midline Location: L2-L3 Injection technique: LOR saline  Needle:  Needle type: Tuohy  Needle gauge: 17 G Needle length: 9 cm Needle insertion depth: 5 cm Catheter type: closed end flexible Catheter size: 20 Guage Catheter at skin depth: 9 cm Test dose: negative  Assessment Events: blood not aspirated, injection not painful, no injection resistance, no paresthesia and negative IV test  Additional Notes Epidural placed by SRNA under direct supervisionReason for block:procedure for pain

## 2020-02-29 NOTE — Lactation Note (Signed)
This note was copied from a baby's chart. Lactation Consultation Note  Patient Name: [name] [id number] Date: 02/29/2020 Reason for consult: L&D Initial assessment;Mother's request;Primapara;1st time breastfeeding;Term;Maternal endocrine disorder (Twin demise) Age:35 hours  LC arrived and Mom asked if we could get the baby to latch. Mom has flat short shafted nipples with areola edema. Nipples will erect some with nipple role, but unable to get infant to latch in a prone position.   [place] talked to Mom about hand expression and offering droplets of colostrum if infant not ready to latch.  LC informed Mom further LC support will be provided on the floor.

## 2020-02-29 NOTE — Plan of Care (Signed)
Problem: Education: °Goal: Knowledge of Childbirth will improve °Outcome: Completed/Met °Goal: Ability to make informed decisions regarding treatment and plan of care will improve °Outcome: Completed/Met °Goal: Ability to state and carry out methods to decrease the pain will improve °Outcome: Completed/Met °Goal: Individualized Educational Video(s) °Outcome: Completed/Met °  °Problem: Coping: °Goal: Ability to verbalize concerns and feelings about labor and delivery will improve °Outcome: Completed/Met °  °Problem: Life Cycle: °Goal: Ability to make normal progression through stages of labor will improve °Outcome: Completed/Met °Goal: Ability to effectively push during vaginal delivery will improve °Outcome: Completed/Met °  °Problem: Role Relationship: °Goal: Will demonstrate positive interactions with the child °Outcome: Completed/Met °  °Problem: Safety: °Goal: Risk of complications during labor and delivery will decrease °Outcome: Completed/Met °  °Problem: Pain Management: °Goal: Relief or control of pain from uterine contractions will improve °Outcome: Completed/Met °  °Problem: Education: °Goal: Knowledge of General Education information will improve °Description: Including pain rating scale, medication(s)/side effects and non-pharmacologic comfort measures °Outcome: Completed/Met °  °Problem: Health Behavior/Discharge Planning: °Goal: Ability to manage health-related needs will improve °Outcome: Completed/Met °  °Problem: Clinical Measurements: °Goal: Ability to maintain clinical measurements within normal limits will improve °Outcome: Completed/Met °Goal: Will remain free from infection °Outcome: Completed/Met °Goal: Diagnostic test results will improve °Outcome: Completed/Met °Goal: Respiratory complications will improve °Outcome: Completed/Met °Goal: Cardiovascular complication will be avoided °Outcome: Completed/Met °  °Problem: Activity: °Goal: Risk for activity intolerance will decrease °Outcome:  Completed/Met °  °Problem: Nutrition: °Goal: Adequate nutrition will be maintained °Outcome: Completed/Met °  °Problem: Coping: °Goal: Level of anxiety will decrease °Outcome: Completed/Met °  °Problem: Elimination: °Goal: Will not experience complications related to bowel motility °Outcome: Completed/Met °Goal: Will not experience complications related to urinary retention °Outcome: Completed/Met °  °Problem: Pain Managment: °Goal: General experience of comfort will improve °Outcome: Completed/Met °  °Problem: Safety: °Goal: Ability to remain free from injury will improve °Outcome: Completed/Met °  °Problem: Skin Integrity: °Goal: Risk for impaired skin integrity will decrease °Outcome: Completed/Met °  °

## 2020-02-29 NOTE — Progress Notes (Signed)
Labor Progress Note  Patient progressing well.  Cervix now 5/90/0, some bloody show present.  S/p SROM.  Pitocin currently 4 mU/min.  Brief period of intermittent late decels resolved with position change.  Continued moderate variability and accelerations are present.    Patient feels well, comfortable with epidural.  Continue current management. Anticipate vaginal delivery.  Alpha Gula MD

## 2020-03-01 ENCOUNTER — Inpatient Hospital Stay (HOSPITAL_COMMUNITY): Payer: BC Managed Care – PPO | Admitting: Certified Registered"

## 2020-03-01 ENCOUNTER — Encounter (HOSPITAL_COMMUNITY)
Admission: AD | Disposition: A | Discharge status: Home / Self Care | Payer: Self-pay | Source: Home / Self Care | Attending: Obstetrics and Gynecology

## 2020-03-01 HISTORY — PX: PERINEAL LACERATION REPAIR: SHX5389

## 2020-03-01 LAB — CBC
HCT: 33.9 % — ABNORMAL LOW (ref 36.0–46.0)
Hemoglobin: 11.4 g/dL — ABNORMAL LOW (ref 12.0–15.0)
MCH: 31.8 pg (ref 26.0–34.0)
MCHC: 33.6 g/dL (ref 30.0–36.0)
MCV: 94.4 fL (ref 80.0–100.0)
Platelets: 185 10*3/uL (ref 150–400)
RBC: 3.59 MIL/uL — ABNORMAL LOW (ref 3.87–5.11)
RDW: 13.2 % (ref 11.5–15.5)
WBC: 17.6 10*3/uL — ABNORMAL HIGH (ref 4.0–10.5)
nRBC: 0 % (ref 0.0–0.2)

## 2020-03-01 SURGERY — SUTURE REPAIR, LACERATION, PERINEUM
Anesthesia: Spinal

## 2020-03-01 MED ORDER — LACTATED RINGERS IV SOLN: INTRAVENOUS | Status: DC | PRN

## 2020-03-01 MED ORDER — PHENYLEPHRINE 40 MCG/ML (10ML) SYRINGE FOR IV PUSH (FOR BLOOD PRESSURE SUPPORT)
PREFILLED_SYRINGE | INTRAVENOUS | Status: AC
  Filled 2020-03-01: qty 10

## 2020-03-01 MED ORDER — PROPOFOL 10 MG/ML IV BOLUS
INTRAVENOUS | Status: AC
  Filled 2020-03-01: qty 20

## 2020-03-01 MED ORDER — SODIUM CHLORIDE 0.9 % IV SOLN
2.0000 g | Freq: Once | INTRAVENOUS | Status: AC
  Administered 2020-03-01: 2 g via INTRAVENOUS
  Filled 2020-03-01: qty 2

## 2020-03-01 MED ORDER — SUCCINYLCHOLINE CHLORIDE 200 MG/10ML IV SOSY
PREFILLED_SYRINGE | INTRAVENOUS | Status: AC
  Filled 2020-03-01: qty 10

## 2020-03-01 MED ORDER — FENTANYL CITRATE (PF) 100 MCG/2ML IJ SOLN
INTRAMUSCULAR | Status: DC | PRN
  Administered 2020-03-01: 100 ug via INTRAVENOUS
  Administered 2020-03-01 (×3): 50 ug via INTRAVENOUS

## 2020-03-01 MED ORDER — FENTANYL CITRATE (PF) 250 MCG/5ML IJ SOLN
INTRAMUSCULAR | Status: AC
  Filled 2020-03-01: qty 5

## 2020-03-01 MED ORDER — LIDOCAINE 2% (20 MG/ML) 5 ML SYRINGE
INTRAMUSCULAR | Status: AC
  Filled 2020-03-01: qty 5

## 2020-03-01 MED ORDER — OXYCODONE HCL 5 MG PO TABS: 5.0000 mg | ORAL_TABLET | Freq: Once | ORAL | Status: DC | PRN

## 2020-03-01 MED ORDER — SODIUM CHLORIDE 0.9 % IR SOLN
Status: DC | PRN
  Administered 2020-03-01 (×2): 1

## 2020-03-01 MED ORDER — PROMETHAZINE HCL 25 MG/ML IJ SOLN: 6.2500 mg | INTRAMUSCULAR | Status: DC | PRN

## 2020-03-01 MED ORDER — LIDOCAINE HCL (CARDIAC) PF 100 MG/5ML IV SOSY
PREFILLED_SYRINGE | INTRAVENOUS | Status: DC | PRN
  Administered 2020-03-01: 100 mg via INTRATRACHEAL

## 2020-03-01 MED ORDER — FENTANYL CITRATE (PF) 100 MCG/2ML IJ SOLN
25.0000 ug | INTRAMUSCULAR | Status: DC | PRN
  Administered 2020-03-02: 50 ug via INTRAVENOUS

## 2020-03-01 MED ORDER — MEPERIDINE HCL 25 MG/ML IJ SOLN: 6.2500 mg | INTRAMUSCULAR | Status: DC | PRN

## 2020-03-01 MED ORDER — MIDAZOLAM HCL 2 MG/2ML IJ SOLN
INTRAMUSCULAR | Status: DC | PRN
  Administered 2020-03-01: 2 mg via INTRAVENOUS

## 2020-03-01 MED ORDER — OXYCODONE HCL 5 MG/5ML PO SOLN: 5.0000 mg | Freq: Once | ORAL | Status: DC | PRN

## 2020-03-01 MED ORDER — FENTANYL CITRATE (PF) 100 MCG/2ML IJ SOLN
INTRAMUSCULAR | Status: AC
  Filled 2020-03-01: qty 2

## 2020-03-01 MED ORDER — MIDAZOLAM HCL 2 MG/2ML IJ SOLN: INTRAMUSCULAR | Status: DC | PRN

## 2020-03-01 MED ORDER — PROPOFOL 10 MG/ML IV BOLUS
INTRAVENOUS | Status: DC | PRN
  Administered 2020-03-01: 180 mg via INTRAVENOUS

## 2020-03-01 MED ORDER — MIDAZOLAM HCL 2 MG/2ML IJ SOLN
INTRAMUSCULAR | Status: AC
  Filled 2020-03-01: qty 2

## 2020-03-01 MED ORDER — DEXAMETHASONE SODIUM PHOSPHATE 10 MG/ML IJ SOLN
INTRAMUSCULAR | Status: DC | PRN
  Administered 2020-03-01: 10 mg via INTRAVENOUS

## 2020-03-01 MED ORDER — ONDANSETRON HCL 4 MG/2ML IJ SOLN
INTRAMUSCULAR | Status: DC | PRN
  Administered 2020-03-01: 4 mg via INTRAVENOUS

## 2020-03-01 MED ORDER — SUCCINYLCHOLINE CHLORIDE 20 MG/ML IJ SOLN
INTRAMUSCULAR | Status: DC | PRN
  Administered 2020-03-01: 120 mg via INTRAVENOUS

## 2020-03-01 SURGICAL SUPPLY — 15 items
DRAPE UNDERBUTTOCKS STRL (DISPOSABLE) ×8 IMPLANT
ELECT REM PT RETURN 9FT ADLT (ELECTROSURGICAL) ×2
ELECTRODE REM PT RTRN 9FT ADLT (ELECTROSURGICAL) ×1 IMPLANT
SET BERKELEY SUCTION TUBING (SUCTIONS) ×2 IMPLANT
SPONGE LAP 18X18 RF (DISPOSABLE) ×2 IMPLANT
SPONGE LAP 18X18 X RAY DECT (DISPOSABLE) ×2 IMPLANT
SPONGE LAP 4X18 RFD (DISPOSABLE) ×6 IMPLANT
SUT MON AB 2-0 CT1 36 (SUTURE) ×2 IMPLANT
SUT MON AB 3-0 SH 27 (SUTURE) ×4
SUT MON AB 3-0 SH27 (SUTURE) ×4 IMPLANT
SUT VIC AB 0 CT1 27 (SUTURE) ×2
SUT VIC AB 0 CT1 27XBRD ANBCTR (SUTURE) ×2 IMPLANT
SYR BULB IRRIG 60ML STRL (SYRINGE) ×2 IMPLANT
TRAY FOLEY W/BAG SLVR 14FR LF (SET/KITS/TRAYS/PACK) ×2 IMPLANT
YANKAUER SUCT BULB TIP NO VENT (SUCTIONS) ×2 IMPLANT

## 2020-03-01 NOTE — Progress Notes (Signed)
Post Partum Day 1 Subjective: no complaints, up ad lib, voiding and tolerating PO  Objective: Blood pressure (!) 99/91, pulse 90, temperature 98 F (36.7 C), temperature source Oral, resp. rate 18, height 5\' 3"  (1.6 m), weight 99.9 kg, last menstrual period 06/03/2019, SpO2 99 %, unknown if currently breastfeeding.  Physical Exam:  General: alert, cooperative, appears stated age and no distress Lochia: appropriate Uterine Fundus: firm Incision:   DVT Evaluation: No evidence of DVT seen on physical exam.  Recent Labs    02/29/20 0022 03/01/20 0613  HGB 12.2 11.4*  HCT 34.8* 33.9*    Assessment/Plan: Plan for discharge tomorrow, Breastfeeding and Circumcision prior to discharge   LOS: 1 day   Luz Lex 03/01/2020, 9:38 AM

## 2020-03-01 NOTE — Social Work (Signed)
CSW received consult for hx of Anxiety.  CSW met with MOB to offer support and complete assessment.     CSW introduced self and role. CSW observed baby in bassinet being soothed by MOB and FOB also bedside. MOB declined having FOB leave the room for assessment. CSW informed MOB of reason for consult. MOB expressed understanding and reported she has never been officially diagnosed. MOB jokingly stated she diagnosed herself. MOB reported she experiences situational anxiety. MOB disclosed she was on Zoloft about 4 years ago, for 3 to 6 months following a divorce. MOB expressed she found the Zoloft to be helpful at the time. MOB stated she has never attended therapy to treat anxiety. MOB shared she is currently feeling good, happy and excited with baby being here. MOB identified FOB, and immediate family as her support system. MOB denies any current SI or HI.   CSW provided education regarding the baby blues period vs. perinatal mood disorders, discussed treatment and gave resources for mental health follow up if concerns arise.  CSW recommends self-evaluation during the postpartum time period using the New Mom Checklist from Postpartum Progress and encouraged MOB to contact a medical professional if symptoms are noted at any time.   CSW provided review of Sudden Infant Death Syndrome (SIDS) precautions.  MOB reported she has all essentials for baby including a bassinet and packn'play. MOB identified [facility] for follow-up care and denies any transportation barriers. MOB expressed no additional needs at this time.   CSW identifies no further need for intervention and no barriers to discharge at this time.  Bryttany Tortorelli, LCSWA Clinical Social Work [facility] [phone]   

## 2020-03-01 NOTE — Anesthesia Postprocedure Evaluation (Signed)
Anesthesia Post Note  Patient: [name]  Procedure(s) Performed: AN AD HOC LABOR EPIDURAL     Patient location during evaluation: Mother Baby Anesthesia Type: Epidural Level of consciousness: awake Pain management: satisfactory to patient Vital Signs Assessment: post-procedure vital signs reviewed and stable Respiratory status: spontaneous breathing Cardiovascular status: stable Anesthetic complications: no   No complications documented.  Last Vitals:  Vitals:   02/29/20 2320 03/01/20 0309  BP: 110/74 109/62  Pulse: 99 95  Resp: 18 18  Temp: 37.2 C 37.1 C  SpO2: 98% 98%    Last Pain:  Vitals:   03/01/20 0327  TempSrc:   PainSc: 3    Pain Goal:                   Casimer Lanius

## 2020-03-01 NOTE — Progress Notes (Signed)
Patient ID: [name], female   DOB: 02-01-1986, 35 y.o.   MRN: [id number] Brief op note PreopDx/Postop dx:  4th degree laceration Anesthesia GET Surgeon:  Corinna Capra Procedure:  Repair of 4th degree laceration EBL 100cc Packing in vagina Foley indwelling Complications none DL

## 2020-03-01 NOTE — Anesthesia Preprocedure Evaluation (Addendum)
Anesthesia Evaluation  Patient identified by MRN, date of birth, ID band Patient awake    Reviewed: Allergy & Precautions, H&P , NPO status , Patient's Chart, lab work & pertinent test results  Airway Mallampati: II  TM Distance: >3 FB Neck ROM: Full    Dental no notable dental hx. (+) Dental Advisory Given   Pulmonary neg pulmonary ROS,    Pulmonary exam normal breath sounds clear to auscultation       Cardiovascular negative cardio ROS Normal cardiovascular exam Rhythm:Regular Rate:Normal     Neuro/Psych negative neurological ROS  negative psych ROS   GI/Hepatic negative GI ROS, Neg liver ROS,   Endo/Other  negative endocrine ROS  Renal/GU negative Renal ROS     Musculoskeletal negative musculoskeletal ROS (+)   Abdominal (+) + obese,   Peds  Hematology negative hematology ROS (+)   Anesthesia Other Findings   Reproductive/Obstetrics                            Anesthesia Physical  Anesthesia Plan  ASA: II  Anesthesia Plan: General   Post-op Pain Management:    Induction: Intravenous, Cricoid pressure planned and Rapid sequence  PONV Risk Score and Plan: 4 or greater and Ondansetron, Dexamethasone, Midazolam, Treatment may vary due to age or medical condition and Scopolamine patch - Pre-op  Airway Management Planned: Oral ETT  Additional Equipment:   Intra-op Plan:   Post-operative Plan: Extubation in OR  Informed Consent: I have reviewed the patients History and Physical, chart, labs and discussed the procedure including the risks, benefits and alternatives for the proposed anesthesia with the patient or authorized representative who has indicated his/her understanding and acceptance.     Dental advisory given  Plan Discussed with: CRNA  Anesthesia Plan Comments: (Discussed GETA vs. Spinal. Pt very nervous and felt more comfortable with GETA.)       Anesthesia  Quick Evaluation

## 2020-03-01 NOTE — Progress Notes (Signed)
Patient ID: [name], female   DOB: 05/16/1985, 35 y.o.   MRN: [id number] Called to see patient after she had a BM and noticed blood and stool coming from her vagina.  She then reports diarrhea type stool seeping from her vagina.  Reports some pelvic pressure and pain Exam.  Perineum intact but RV confirms 4 cm rectal tear that communicates with posterior wall of vagina.  The anal sphincter is intact.  4th degree laceration - rec repair in the OR and Pt wants it repaired ASAP. Last ate at 2pm so will schedule repair of 4th degree laceration with anesthesia at 10pm.. R&B discussed and informed consent obtained. DL

## 2020-03-01 NOTE — Transfer of Care (Signed)
Immediate Anesthesia Transfer of Care Note  Patient: [name]  Procedure(s) Performed: SUTURE REPAIR PERINEAL LACERATION (N/A )  Patient Location: PACU  Anesthesia Type:General  Level of Consciousness: awake, alert  and oriented  Airway & Oxygen Therapy: Patient Spontanous Breathing  Post-op Assessment: Report given to RN and Post -op Vital signs reviewed and stable  Post vital signs: Reviewed and stable  Last Vitals:  Vitals Value Taken Time  BP 95/58 03/01/20 2349  Temp 36.4 C 03/01/20 2350  Pulse 100 03/01/20 2352  Resp 13 03/01/20 2352  SpO2 96 % 03/01/20 2352  Vitals shown include unvalidated device data.  Last Pain:  Vitals:   03/01/20 2350  TempSrc: Oral  PainSc:       Patients Stated Pain Goal: 3 (74/08/14 4818)  Complications: No complications documented.

## 2020-03-01 NOTE — Anesthesia Procedure Notes (Signed)
Procedure Name: Intubation Date/Time: 03/01/2020 10:24 PM Performed by: Kathie Rhodes, CRNA Pre-anesthesia Checklist: Patient identified, Emergency Drugs available, Suction available, Patient being monitored and Timeout performed Patient Re-evaluated:Patient Re-evaluated prior to induction Oxygen Delivery Method: Circle system utilized Preoxygenation: Pre-oxygenation with 100% oxygen Induction Type: IV induction, Rapid sequence and Cricoid Pressure applied Laryngoscope Size: Glidescope and 3 Grade View: Grade I Tube type: Oral Tube size: 7.0 mm Number of attempts: 1 Airway Equipment and Method: Stylet Placement Confirmation: ETT inserted through vocal cords under direct vision,  positive ETCO2,  CO2 detector and breath sounds checked- equal and bilateral Secured at: 23 cm Tube secured with: Tape Dental Injury: Teeth and Oropharynx as per pre-operative assessment

## 2020-03-02 ENCOUNTER — Encounter (HOSPITAL_COMMUNITY): Payer: Self-pay | Admitting: Obstetrics and Gynecology

## 2020-03-02 LAB — CBC
HCT: 29.5 % — ABNORMAL LOW (ref 36.0–46.0)
Hemoglobin: 9.8 g/dL — ABNORMAL LOW (ref 12.0–15.0)
MCH: 31.7 pg (ref 26.0–34.0)
MCHC: 33.2 g/dL (ref 30.0–36.0)
MCV: 95.5 fL (ref 80.0–100.0)
Platelets: 191 10*3/uL (ref 150–400)
RBC: 3.09 MIL/uL — ABNORMAL LOW (ref 3.87–5.11)
RDW: 13.4 % (ref 11.5–15.5)
WBC: 21.3 10*3/uL — ABNORMAL HIGH (ref 4.0–10.5)
nRBC: 0 % (ref 0.0–0.2)

## 2020-03-02 MED ORDER — SODIUM CHLORIDE 0.9 % IV SOLN
2.0000 g | Freq: Two times a day (BID) | INTRAVENOUS | Status: DC
  Filled 2020-03-02 (×2): qty 2

## 2020-03-02 MED ORDER — MEDROXYPROGESTERONE ACETATE 150 MG/ML IM SUSP: 150.0000 mg | INTRAMUSCULAR | Status: DC | PRN

## 2020-03-02 MED ORDER — WITCH HAZEL-GLYCERIN EX PADS
1.0000 "application " | MEDICATED_PAD | CUTANEOUS | Status: DC | PRN
  Administered 2020-03-03: 1 via TOPICAL

## 2020-03-02 MED ORDER — ONDANSETRON HCL 4 MG PO TABS: 4.0000 mg | ORAL_TABLET | ORAL | Status: DC | PRN

## 2020-03-02 MED ORDER — DIPHENHYDRAMINE HCL 25 MG PO CAPS: 25.0000 mg | ORAL_CAPSULE | Freq: Four times a day (QID) | ORAL | Status: DC | PRN

## 2020-03-02 MED ORDER — ONDANSETRON HCL 4 MG/2ML IJ SOLN: 4.0000 mg | INTRAMUSCULAR | Status: DC | PRN

## 2020-03-02 MED ORDER — BENZOCAINE-MENTHOL 20-0.5 % EX AERO: 1.0000 "application " | INHALATION_SPRAY | CUTANEOUS | Status: DC | PRN

## 2020-03-02 MED ORDER — PRENATAL MULTIVITAMIN CH
1.0000 | ORAL_TABLET | Freq: Every day | ORAL | Status: DC
  Administered 2020-03-02 – 2020-03-03 (×2): 1 via ORAL
  Filled 2020-03-02 (×2): qty 1

## 2020-03-02 MED ORDER — TETANUS-DIPHTH-ACELL PERTUSSIS 5-2.5-18.5 LF-MCG/0.5 IM SUSY: 0.5000 mL | PREFILLED_SYRINGE | Freq: Once | INTRAMUSCULAR | Status: DC

## 2020-03-02 MED ORDER — MEASLES, MUMPS & RUBELLA VAC IJ SOLR: 0.5000 mL | Freq: Once | INTRAMUSCULAR | Status: DC

## 2020-03-02 MED ORDER — SENNOSIDES-DOCUSATE SODIUM 8.6-50 MG PO TABS
2.0000 | ORAL_TABLET | Freq: Every day | ORAL | Status: DC
  Administered 2020-03-02 – 2020-03-03 (×2): 2 via ORAL
  Filled 2020-03-02 (×2): qty 2

## 2020-03-02 MED ORDER — OXYCODONE-ACETAMINOPHEN 5-325 MG PO TABS: 2.0000 | ORAL_TABLET | ORAL | Status: DC | PRN

## 2020-03-02 MED ORDER — DIBUCAINE (PERIANAL) 1 % EX OINT: 1.0000 "application " | TOPICAL_OINTMENT | CUTANEOUS | Status: DC | PRN

## 2020-03-02 MED ORDER — SIMETHICONE 80 MG PO CHEW: 80.0000 mg | CHEWABLE_TABLET | ORAL | Status: DC | PRN

## 2020-03-02 MED ORDER — ACETAMINOPHEN 325 MG PO TABS
650.0000 mg | ORAL_TABLET | ORAL | Status: DC | PRN
  Administered 2020-03-02: 650 mg via ORAL
  Filled 2020-03-02: qty 2

## 2020-03-02 MED ORDER — COCONUT OIL OIL: 1.0000 "application " | TOPICAL_OIL | Status: DC | PRN

## 2020-03-02 MED ORDER — FENTANYL CITRATE (PF) 100 MCG/2ML IJ SOLN
INTRAMUSCULAR | Status: AC
  Filled 2020-03-02: qty 2

## 2020-03-02 MED ORDER — SODIUM CHLORIDE 0.9 % IV SOLN
2.0000 g | Freq: Two times a day (BID) | INTRAVENOUS | Status: AC
  Administered 2020-03-02 (×2): 2 g via INTRAVENOUS
  Filled 2020-03-02 (×2): qty 2

## 2020-03-02 MED ORDER — IBUPROFEN 600 MG PO TABS
600.0000 mg | ORAL_TABLET | Freq: Four times a day (QID) | ORAL | Status: DC
  Administered 2020-03-02 – 2020-03-03 (×7): 600 mg via ORAL
  Filled 2020-03-02 (×7): qty 1

## 2020-03-02 MED ORDER — ZOLPIDEM TARTRATE 5 MG PO TABS: 5.0000 mg | ORAL_TABLET | Freq: Every evening | ORAL | Status: DC | PRN

## 2020-03-02 MED ORDER — OXYCODONE-ACETAMINOPHEN 5-325 MG PO TABS
1.0000 | ORAL_TABLET | ORAL | Status: DC | PRN
  Administered 2020-03-02 – 2020-03-03 (×3): 1 via ORAL
  Filled 2020-03-02 (×3): qty 1

## 2020-03-02 NOTE — Lactation Note (Signed)
This note was copied from a baby's chart. Lactation Consultation Note Baby 41 hrs old. Mom had surgery for 4th degree repair. Baby crying, hungry. [place] asked mom if she wanted LC to help her latch baby. Mom stated she wasn't BF tonight had surgery this evening and wasn't going to BF tonight. Mom stated hopefully tomorrow she will feel like BF. But he is getting formula tonight.  FOB asked if I could swaddle baby. LC did so. LC gave baby bottle discussing pace feeding. LC gave information on how much to give formula verses BF supplement.  Baby is very jaundice in appearance. Bruising to head noted. Mom stated RN is monitoring his bili.  Mom doesn't feel like pumping or doing anything but resting tonight. Encouraged to call for assistance if needed.  Patient Name: [name] [id number] Date: 03/02/2020 Reason for consult: Follow-up assessment;Primapara;Term Age:38 hours  Maternal Data    Feeding Feeding Type: Bottle Fed - Formula Nipple Type: Extra Slow Flow  LATCH Score                   Interventions    Lactation Tools Discussed/Used Tools: Bottle   Consult Status Consult Status: Follow-up Date: 03/02/20 Follow-up type: In-patient    [name], Elta Guadeloupe 03/02/2020, 2:02 AM

## 2020-03-02 NOTE — Anesthesia Postprocedure Evaluation (Signed)
Anesthesia Post Note  Patient: [name]  Procedure(s) Performed: SUTURE REPAIR PERINEAL LACERATION (N/A )     Patient location during evaluation: PACU Anesthesia Type: General Level of consciousness: sedated and patient cooperative Pain management: pain level controlled Vital Signs Assessment: post-procedure vital signs reviewed and stable Respiratory status: spontaneous breathing Cardiovascular status: stable Anesthetic complications: no   No complications documented.  Last Vitals:  Vitals:   03/02/20 0104 03/02/20 0204  BP: 99/67 100/67  Pulse: 84 87  Resp: 15 16  Temp: 37.1 C   SpO2: 97% 97%    Last Pain:  Vitals:   03/02/20 0613  TempSrc:   PainSc: 7    Pain Goal: Patients Stated Pain Goal: 3 (03/01/20 1530)                 Nolon Nations

## 2020-03-02 NOTE — Lactation Note (Signed)
This note was copied from a baby's chart. Lactation Consultation Note  Patient Name: [name] [id number] Date: 03/02/2020 Reason for consult: Follow-up assessment;Primapara;Difficult latch;Term;Other (Comment) (mom had sx last night/ 4th degree tear) Age:35 hours   Mom pumping when LC entered the room.  Infant was swaddled with grandmother.  Mom states she is discouraged due to infant not latching is just now feeling like pumping since yesterday, (but getting very little).  Mom was pumping for the first time today.  Infant fed approx. 4 hours ago.  LC expressed understanding but encouraged mom not to give up if she desires to try to BF, (infant only 12 hours old).   LC suggested putting infant STS.  Rooting noted.  LC assisted with latch in laid back.  Mom has swelling and tissue is compressible but infant was unable to latch.  Mom had a 20 NS in room which was prefilled with formula, using a curved tip syringe.    Infant latched easily and pillow support and rolled blankets used to better support positioning.  Mom was thrilled to have infant actively sucking at the breast.  Swallows heard only at the beginning of the feed but no colostrum was noted after the feeding.  Infant fed actively for 12 minutes but needed a great amount of stimulation to get sucking.    Mom was encouraged to continue to hand express before and after breastfeeding and pumping, offer the breast with out shield first, (using hand pump to evert nipple).   LC suggested she continue to pump after breastfeeding or if infant doesn't go to the breast and receives a bottle.  Mom understands supplementing after the breastfeeding is needed. LC reviewed listening for swallows, observing colostrum in NS after feeding, waking techniques and STS.  Family knows how to apply NS and how to use the curved tip syringe for filling NS.    Infant still at the breast when [facility] left room.  Mom is aware of BFSG, OP services, and phone line.      Maternal Data    Feeding    LATCH Score Latch: Repeated attempts needed to sustain latch, nipple held in mouth throughout feeding, stimulation needed to elicit sucking reflex.  Audible Swallowing: None  Type of Nipple: Flat  Comfort (Breast/Nipple): Soft / non-tender  Hold (Positioning): Assistance needed to correctly position infant at breast and maintain latch.  LATCH Score: 5  Interventions Interventions: Breast feeding basics reviewed;Assisted with latch;Skin to skin;Breast massage;Position options;Shells;DEBP;Support pillows (curved tip syringe to prefill NS 20)  Lactation Tools Discussed/Used     Consult Status Consult Status: Follow-up Date: 03/03/20 Follow-up type: In-patient    Ferne Coe [facility] 03/02/2020, 1:04 PM

## 2020-03-02 NOTE — Progress Notes (Signed)
Post Partum Day 2 but POD#1 s/p repair of occult fourth degree Subjective: no complaints. Foley in place. Pain well controlled.   Objective: Blood pressure 100/67, pulse 87, temperature 98.7 F (37.1 C), temperature source Oral, resp. rate 16, height 5\' 3"  (1.6 m), weight 99.9 kg, last menstrual period 06/03/2019, SpO2 97 %, unknown if currently breastfeeding.  Physical Exam:  General: alert Lochia: appropriate Uterine Fundus: firm Incision: healing well, no significant drainage, no dehiscence, no significant erythema. Vaginal packing removed without further heavy bleeding DVT Evaluation: No evidence of DVT seen on physical exam.  Recent Labs    03/01/20 0613 03/02/20 0630  HGB 11.4* 9.8*  HCT 33.9* 29.5*    Assessment/Plan: Plan for discharge tomorrow.  PPD#2 but POD#1 s/p repair of large occult fourth degree tear.  Vaginal packing removed w/out further bleeding. D/c foley. Await trial of void. D/w pt she may consider a Cesarean section with next delivery.    LOS: 2 days   Tyson Dense 03/02/2020, 8:11 AM

## 2020-03-02 NOTE — Op Note (Signed)
[name], [name] MEDICAL RECORD [id number] ACCOUNT [id number] DATE OF BIRTH:19-Nov-1985 FACILITY: [facility] LOCATION: [place], [place]  OPERATIVE REPORT  DATE OF PROCEDURE:  03/01/2020  PREOPERATIVE DIAGNOSIS:  A fourth-degree laceration.  POSTOPERATIVE DIAGNOSIS:  A fourth-degree laceration.  PROCEDURE:  Surgical repair of fourth-degree laceration.  SURGEON:  Dr. Louretta Shorten.  ANESTHESIA:  General endotracheal.  INDICATIONS:  The patient is a 35 year old G1 P1, status post a vaginal delivery on 26/71/2458 which was complicated by third-degree laceration repair.  Following day, I received a phone call from the patient's nurse stating that when she went to the  bathroom she was having stool coming through the vagina and it persisted as diarrhea coming from the vagina.  Examination revealed feces in the vagina and rectovaginal revealed a 4-5 cm linear laceration on the anterior surface of the rectum, which had  continuity to the vagina.  The external rectal sphincter appeared to be intact, but due to the fourth-degree laceration with fistula into the vagina, we recommended repair.  We discussed the risks and benefits of the procedure, which include but not  limited to risk of infection, bleeding, damage to the bowel, bladder, rectum, possibility of breakdown of the repair.  Risks also include rectal incontinence or rectovaginal fistula and infection.  She does want to proceed with the repair.  DESCRIPTION OF PROCEDURE:  The patient was taken to the operating room, given general anesthesia, was placed in the dorsal lithotomy position.  She was given 2 grams of cefotetan IV.  She was sterilely prepped and draped.  The previous repair of the  third degree and vaginal repair was easily removed as a lot of these suture remnants had fallen to wayside from the vagina.  After they were removed, copious amount of irrigation was used throughout the vagina and into the rectum and the  vaginal mucosa  was identified as was the rectal laceration approximately 4-5 cm in length.  It was grasped with an Allis clamp and a 3-0 Monocryl suture was used to reapproximate the mucosa.  With good approximation noted, a second suture of 3-0 Monocryl was used to  pull the rectal sphincter and perirectal fascia in a second line of continuous suture over the original repair.  A third repair with 3-0 Monocryl was used to pull perirectal fascia over the repair with good support on rectal exam and good thickness and  repair and hemostasis noted.  The external sphincter was then grasped with Allis clamps.  They were reapproximated with overlapping repair using 0 Vicryl suture with four sutures placed at 12, 3, 6, and 9 o'clock position with good support noted.   Copious antibiotic irrigation was once again applied and the vaginal mucosa was closed using a 2-0 chromic.  Perineum was similarly closed with a 2-0 chromic, approximating the bulbocavernosus and superficial transverse perinei muscles and closing the  perineal skin in a subcuticular fashion with good approximation and good hemostasis noted.  Bleeding was noted at the introitus just superior to the hymen and sutures of 0 chromic were used in a figure-of-eight fashion to achieve hemostasis.  After  copious amount of irrigations, the vaginal bleeding was minimal, but responded well to pressure.  A lap pad was placed in the vagina with minimal bleeding noted.  The patient was then transferred to the recovery room in stable condition with estimated  blood loss of 100 mL.  Rectovaginal exam revealed good external sphincter, internal sphincter and repair of the laceration.  IN/NUANCE  D:03/01/2020  T:03/02/2020 JOB:[id number]

## 2020-03-03 ENCOUNTER — Ambulatory Visit: Payer: Self-pay

## 2020-03-03 ENCOUNTER — Encounter (HOSPITAL_COMMUNITY): Payer: Self-pay | Admitting: Obstetrics and Gynecology

## 2020-03-03 LAB — SURGICAL PATHOLOGY

## 2020-03-03 MED ORDER — ACETAMINOPHEN 325 MG PO TABS: 650.0000 mg | ORAL_TABLET | Freq: Four times a day (QID) | ORAL | 0 refills | Status: DC | PRN

## 2020-03-03 MED ORDER — SENNOSIDES-DOCUSATE SODIUM 8.6-50 MG PO TABS: 2.0000 | ORAL_TABLET | Freq: Every day | ORAL | 0 refills | Status: DC

## 2020-03-03 MED ORDER — IBUPROFEN 600 MG PO TABS: 600.0000 mg | ORAL_TABLET | Freq: Four times a day (QID) | ORAL | 0 refills | Status: DC | PRN

## 2020-03-03 MED ORDER — OXYCODONE HCL 5 MG PO TABS: 5.0000 mg | ORAL_TABLET | Freq: Four times a day (QID) | ORAL | 0 refills | Status: DC | PRN

## 2020-03-03 NOTE — Progress Notes (Signed)
Post Partum Day 2 Subjective: up ad lib, voiding, tolerating PO and + flatus No BM yet Objective: Blood pressure 103/67, pulse 82, temperature 97.7 F (36.5 C), temperature source Oral, resp. rate 17, height 5\' 3"  (1.6 m), weight 99.9 kg, last menstrual period 06/03/2019, SpO2 97 %, unknown if currently breastfeeding.  Physical Exam:  General: alert, cooperative and no distress Lochia: appropriate Uterine Fundus: firm Incision: healing well DVT Evaluation: No evidence of DVT seen on physical exam.  Recent Labs    03/01/20 0613 03/02/20 0630  HGB 11.4* 9.8*  HCT 33.9* 29.5*    Assessment/Plan: Discharge home D/W stool softeners No rectal entry  LOS: 3 days   Shon Millet II 03/03/2020, 7:24 AM

## 2020-03-03 NOTE — Discharge Summary (Signed)
Postpartum Discharge Summary  Date of Service updated 02/25/26     Patient Name: [name] DOB: 05-Jun-1985 MRN: [id number]  Date of admission: 02/29/2020 Delivery date:02/29/2020  Delivering provider: Eyvonne Mechanic A  Date of discharge: 03/03/2020  Admitting diagnosis: Pregnancy [Z34.90] Intrauterine pregnancy: [redacted]w[redacted]d    Secondary diagnosis:  Active Problems:   Pregnancy  Additional problems: fourth degree laceration    Discharge diagnosis: Term Pregnancy Delivered                                              Post partum procedures:repair of fourth degree vaginal laceration on PPD #1 Augmentation: AROM and Pitocin Complications: None  Hospital course: Induction of Labor With Vaginal Delivery   35y.o. yo G1P1001 at 380w0das admitted to the hospital 02/29/2020 for induction of labor.  Indication for induction: Favorable cervix at term.  Patient had an uncomplicated labor course as follows: Membrane Rupture Time/Date: 10:27 AM ,02/29/2020   Delivery Method:Vaginal, Spontaneous  Episiotomy: None  Lacerations:  3rd degree  Details of delivery can be found in separate delivery note.  Patient had a routine postpartum course. Patient is discharged home 03/03/20.  Newborn Data: Birth date:02/29/2020  Birth time:7:19 PM  Gender:Female  Living status:Living  Apgars:8 ,9  Weight:3555 g   Magnesium Sulfate received: No BMZ received: No Rhophylac:No MMR:No T-DaP:Given prenatally Flu: No Transfusion:No  Physical exam  Vitals:   03/02/20 0204 03/02/20 1733 03/02/20 2021 03/03/20 0503  BP: 100/67 96/61 116/74 103/67  Pulse: 87 95 95 82  Resp: '16 16 19 17  ' Temp:  98.4 F (36.9 C) 98 F (36.7 C) 97.7 F (36.5 C)  TempSrc:  Oral Oral Oral  SpO2: 97% 99% 98% 97%  Weight:      Height:       General: alert, cooperative and no distress Lochia: appropriate Uterine Fundus: firm Incision: Healing well with no significant drainage DVT Evaluation: No evidence of DVT seen  on physical exam. Labs: Lab Results  Component Value Date   WBC 21.3 (H) 03/02/2020   HGB 9.8 (L) 03/02/2020   HCT 29.5 (L) 03/02/2020   MCV 95.5 03/02/2020   PLT 191 03/02/2020   CMP Latest Ref Rng & Units 01/13/2012  Glucose 70 - 99 mg/dL 84  BUN 6 - 23 mg/dL 10  Creatinine 0.4 - 1.2 mg/dL 1.1  Sodium 135 - 145 mEq/L 136  Potassium 3.5 - 5.1 mEq/L 4.1  Chloride 96 - 112 mEq/L 102  CO2 19 - 32 mEq/L 28  Calcium 8.4 - 10.5 mg/dL 9.4   Edinburgh Score: Edinburgh Postnatal Depression Scale Screening Tool 02/29/2020  I have been able to laugh and see the funny side of things. 0  I have looked forward with enjoyment to things. 0  I have blamed myself unnecessarily when things went wrong. 1  I have been anxious or worried for no good reason. 1  I have felt scared or panicky for no good reason. 1  Things have been getting on top of me. 0  I have been so unhappy that I have had difficulty sleeping. 0  I have felt sad or miserable. 0  I have been so unhappy that I have been crying. 1  The thought of harming myself has occurred to me. 0  Edinburgh Postnatal Depression Scale Total 4  After visit meds:  Allergies as of 03/03/2020      Reactions   Banana Nausea And Vomiting      Medication List    STOP taking these medications   polyethylene glycol 17 g packet Commonly known as: MiraLax   psyllium 58.6 % packet Commonly known as: METAMUCIL   valACYclovir 500 MG tablet Commonly known as: VALTREX     TAKE these medications   acetaminophen 325 MG tablet Commonly known as: Tylenol Take 2 tablets (650 mg total) by mouth every 6 (six) hours as needed (for pain scale < 4).   ibuprofen 600 MG tablet Commonly known as: ADVIL Take 1 tablet (600 mg total) by mouth every 6 (six) hours as needed.   PNV PO Take 1 tablet by mouth daily.   senna-docusate 8.6-50 MG tablet Commonly known as: Senokot-S Take 2 tablets by mouth daily.        Discharge home in stable  condition Infant Feeding: Breast Infant Disposition:home with mother Discharge instruction: per After Visit Summary and Postpartum booklet. Activity: Advance as tolerated. Pelvic rest for 6 weeks.  Diet: routine diet Anticipated Birth Control: Unsure Postpartum Appointment:6 weeks Additional Postpartum F/U:  Future Appointments:No future appointments. Follow up Visit:      03/03/2020 Allena Katz, MD

## 2020-03-03 NOTE — Lactation Note (Signed)
This note was copied from a baby's chart. Lactation Consultation Note  Patient Name: Boy Fidencia Mccloud BMWUX'L Date: 03/03/2020 Reason for consult: Follow-up assessment;Hyperbilirubinemia Age:35 years  Follow up to 35 years old infant with 6.05% weight loss at the time of visit. Infant is under phototherapy upon arrival. Mother states she has pumped 3 times today. Last pumping session mother collected ~3mL and gave back to infant prior to formula feed. Mother reports early breast changes consistent with Lactogenesis II. Encouraged mother to continue pumping to prevent engorgement. Discussed using ice and moist heat.  Infant is latching well when using nipple shield and mother denies discomfort or pain with latch. Per parents, infant is having good voids and stools.    Feeding plan:  1. Breastfeed following hunger cues.  2. Keep infant awake during breastfeeding session: massaging breast, infant's hand/shoulder/feet 3. Offer breast 8 - 12 times in 24h period to establish good milk supply.   4. Pump or hand-express, offer EBM followed by formula as needed according to guidelines, paced bottle feeding and fullness cues.   5. Monitor voids and stools as signs good intake 6. Encouraged maternal rest, hydration and food intake.  7. Contact Lactation Services or local resources for support, questions or concerns.    All questions answered at this time.    Maternal Data Formula Feeding for Exclusion: No Does the patient have breastfeeding experience prior to this delivery?: No  Feeding Feeding Type: Breast Fed Nipple Type: Slow - flow  Interventions Interventions: Breast feeding basics reviewed;Expressed milk;DEBP  Lactation Tools Discussed/Used Tools: Nipple Shields Nipple shield size: 20 Breast pump type: Double-Electric Breast Pump   Consult Status Consult Status: Follow-up Date: 03/04/20 Follow-up type: In-patient    Toney Difatta A Higuera Ancidey 03/03/2020, 9:12 PM

## 2020-03-04 ENCOUNTER — Ambulatory Visit: Payer: Self-pay

## 2020-03-04 NOTE — Lactation Note (Signed)
This note was copied from a baby's chart. Lactation Consultation Note  Patient Name: Boy Cherril Hett [id number] Date: 03/04/2020 Reason for consult: Follow-up assessment Age:35 days   P1 mother whose infant is now 108 hours old.  This is a term baby who is being treated for jaundice.  The last bilirubin level was 12.2 mg/dl at 82 hours of life. Mother is breast feeding using a NS and supplementing with her EBM and Similac 20 calorie formula.  Mother was finishing feeding baby when I arrived.  He consumed 10 mls of her EBM and 55 mls of formula.  No spitting noted.    Mother had questions regarding her NS.  Even though baby was no longer interested in feeding I suggested we size the NS.  She is using a #20 and this is the correct size as of now.  Had mother demonstrate placement.  Her breasts are full and heavy this morning.  Mother is encouraged to know that she is now producing much more milk.  Encouraged to continue pumping after every feeding.  Engorgement prevention/treatment reviewed.  Mother has a manual pump and a DEBP for home use.  Explained how to take pump parts home and how to cut tubing if needed to fit on her personal DEBP to use as back up tubing.  Mother is using nipple balm and I provided comfort gels with instructions for use.  Father present and supportive.  Mother has our OP phone number for any questions/concerns after discharge.   Maternal Data    Feeding    LATCH Score                   Interventions    Lactation Tools Discussed/Used     Consult Status Consult Status: Complete Date: 03/04/20 Follow-up type: Call as needed    David Rodriquez R Braelyn Bordonaro 03/04/2020, 8:20 AM

## 2020-03-27 DIAGNOSIS — N760 Acute vaginitis: Secondary | ICD-10-CM | POA: Diagnosis not present

## 2020-04-11 DIAGNOSIS — N390 Urinary tract infection, site not specified: Secondary | ICD-10-CM | POA: Diagnosis not present

## 2020-04-11 DIAGNOSIS — Z1389 Encounter for screening for other disorder: Secondary | ICD-10-CM | POA: Diagnosis not present

## 2020-05-10 DIAGNOSIS — M545 Low back pain, unspecified: Secondary | ICD-10-CM | POA: Diagnosis not present

## 2020-05-16 DIAGNOSIS — M79605 Pain in left leg: Secondary | ICD-10-CM | POA: Diagnosis not present

## 2020-05-16 DIAGNOSIS — M79604 Pain in right leg: Secondary | ICD-10-CM | POA: Diagnosis not present

## 2020-05-16 DIAGNOSIS — R5383 Other fatigue: Secondary | ICD-10-CM | POA: Diagnosis not present

## 2020-05-17 DIAGNOSIS — R5383 Other fatigue: Secondary | ICD-10-CM | POA: Diagnosis not present

## 2020-06-29 DIAGNOSIS — E0590 Thyrotoxicosis, unspecified without thyrotoxic crisis or storm: Secondary | ICD-10-CM | POA: Diagnosis not present

## 2020-09-24 IMAGING — CT CT ABD-PELV W/ CM
1 of 2 series · 13 of 32 positions shown, 18 images · IV contrast (APPLIED)
Comparison: Abdominal ultrasound July 17, 2016

CLINICAL DATA: Abdominal pain

EXAM:
CT ABDOMEN AND PELVIS WITH CONTRAST
TECHNIQUE: Multidetector CT imaging of the abdomen and pelvis was performed
using the standard protocol following bolus administration of
intravenous contrast. Oral contrast was also administered.
CONTRAST:  100mL [id number] IOPAMIDOL (CJ48LF-RBB) INJECTION 61%

[Series 2: abd/pelvis w/cm · axial · 0.79mm/px · z∈[-472,-72]mm · 13 of 90 slices shown, 18 images]
[im 5/90  soft-tissue]
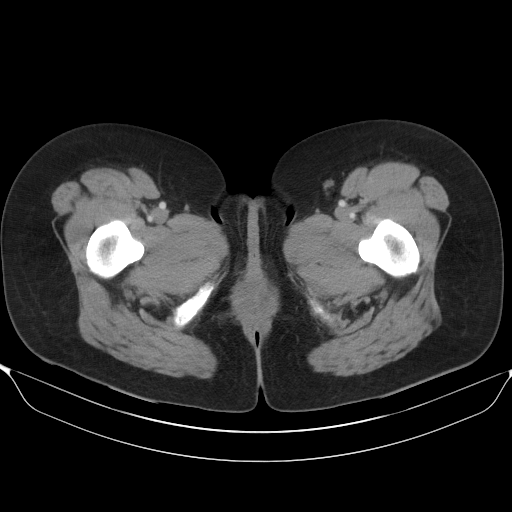
[im 5/90  bone]
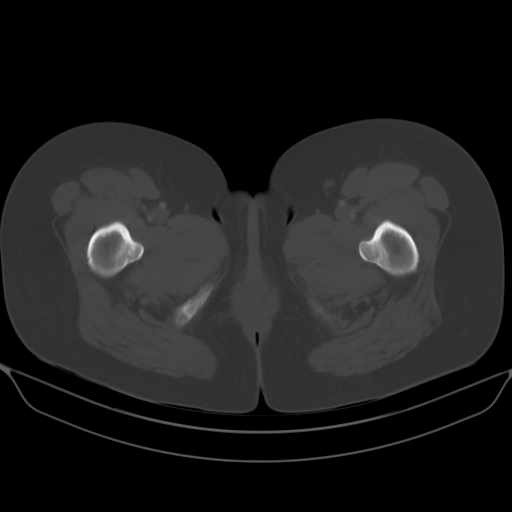
[im 15/90  soft-tissue]
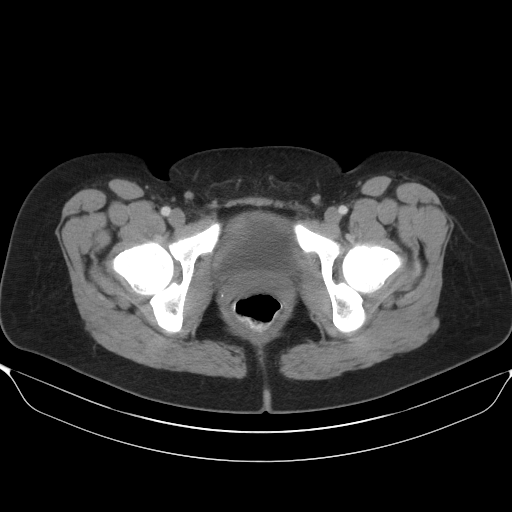
[im 19/90  soft-tissue]
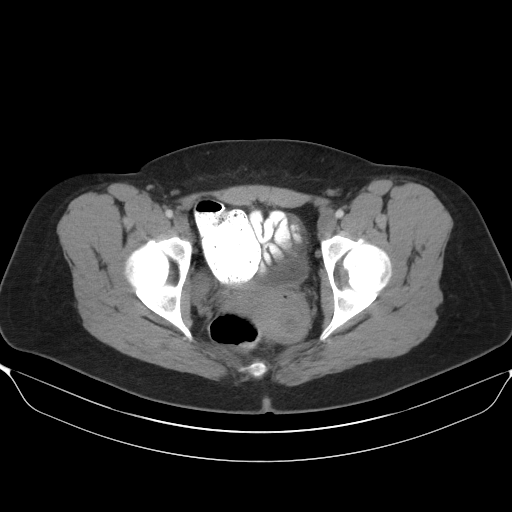
[im 29/90  soft-tissue]
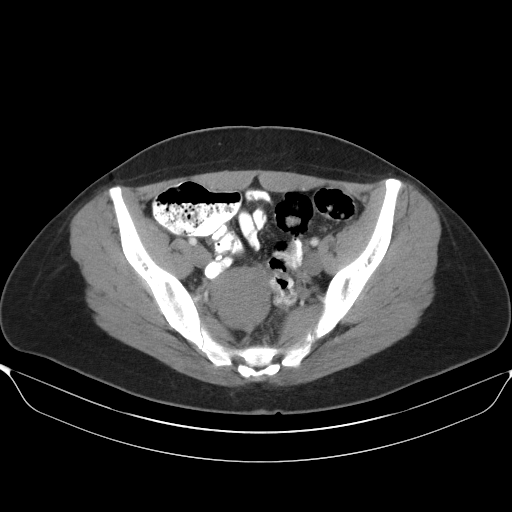
[im 33/90  soft-tissue]
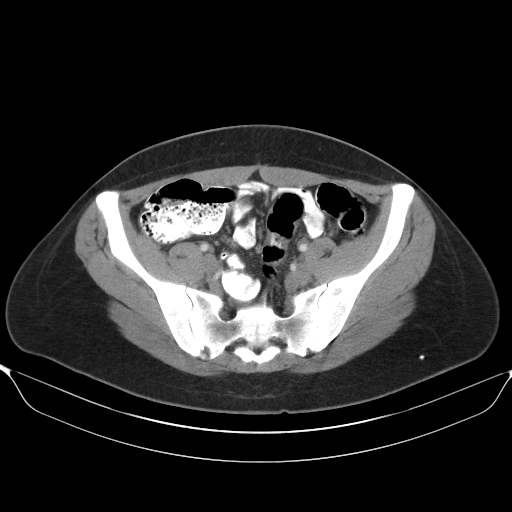
[im 43/90  soft-tissue]
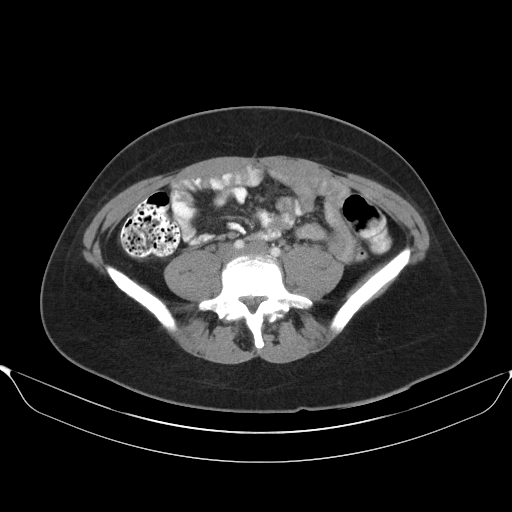
[im 47/90  soft-tissue]
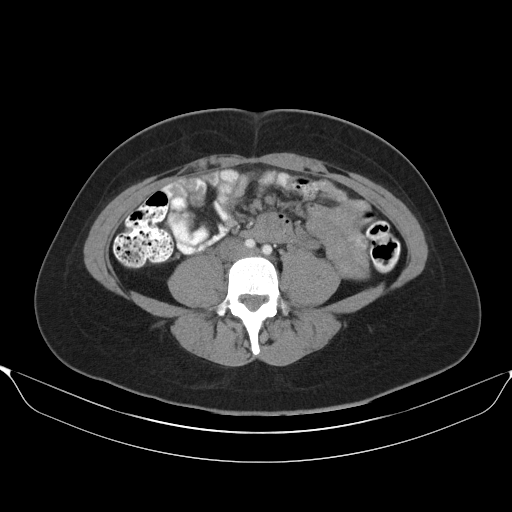
[im 57/90  soft-tissue]
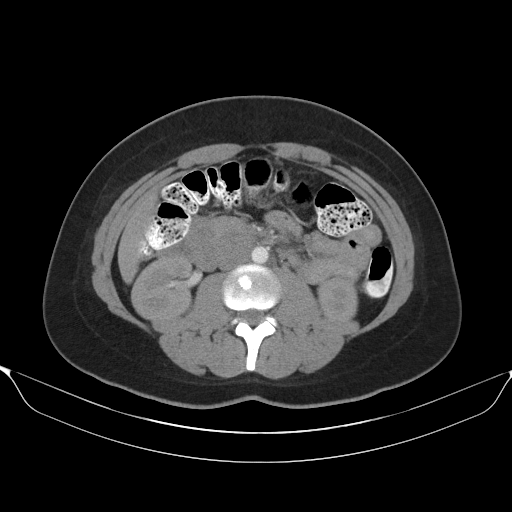
[im 61/90  soft-tissue]
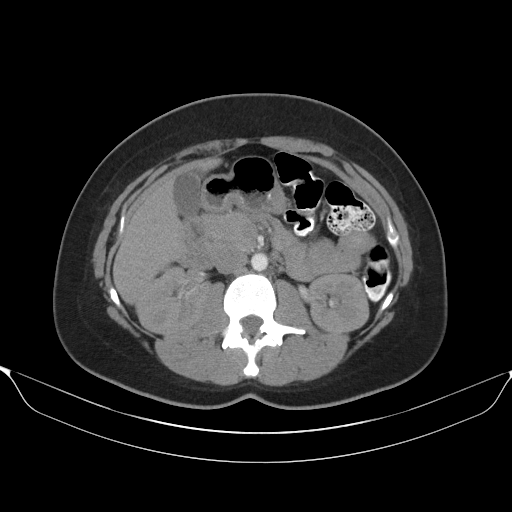
[im 61/90  bone]
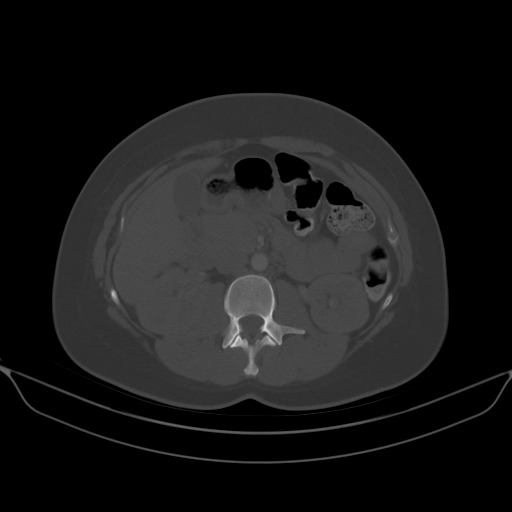
[im 71/90  soft-tissue]
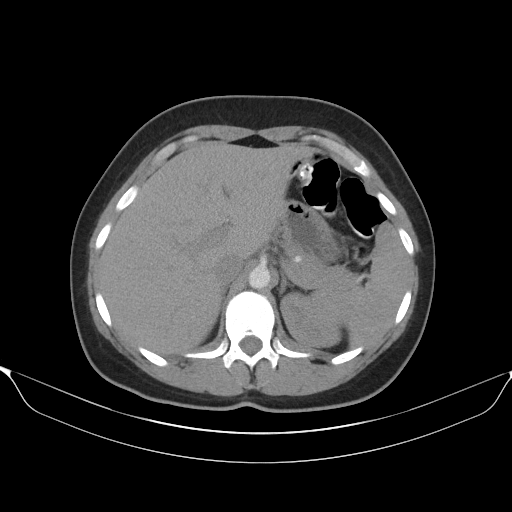
[im 71/90  lung]
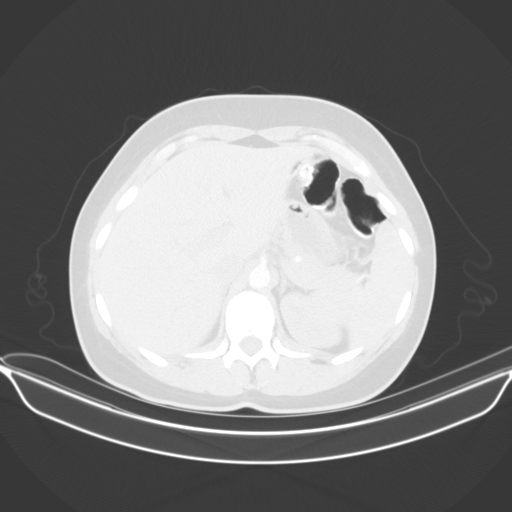
[im 75/90  soft-tissue]
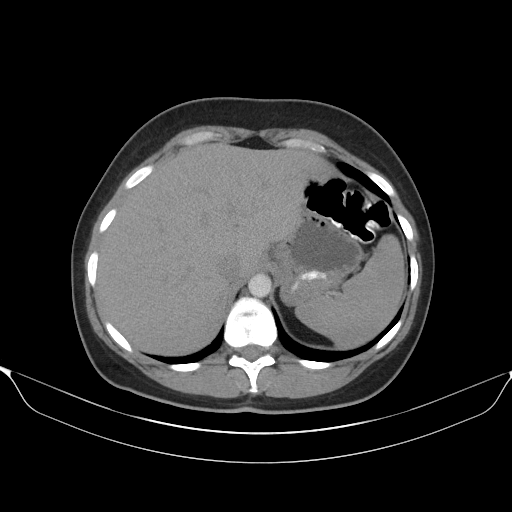
[im 75/90  lung]
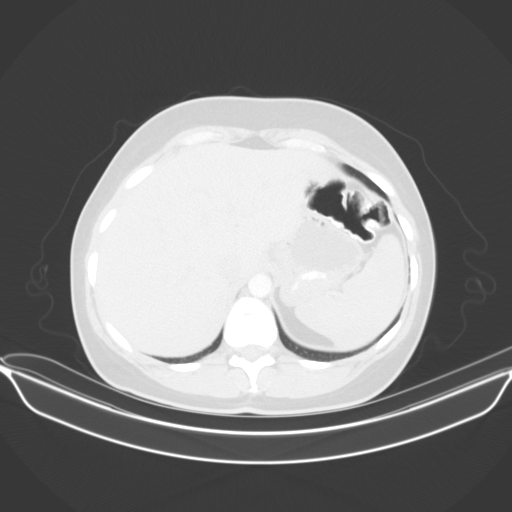
[im 80/90  lung]
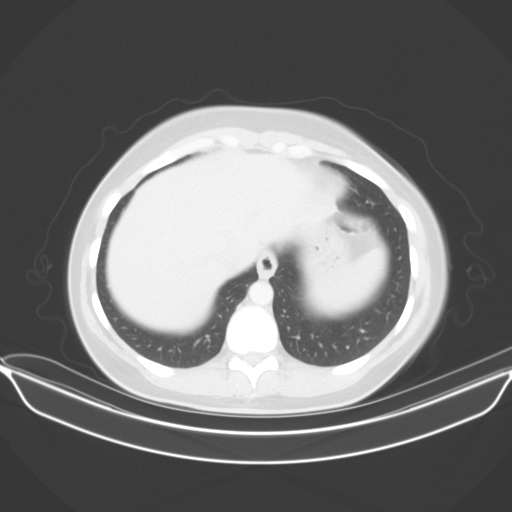
[im 85/90  soft-tissue]
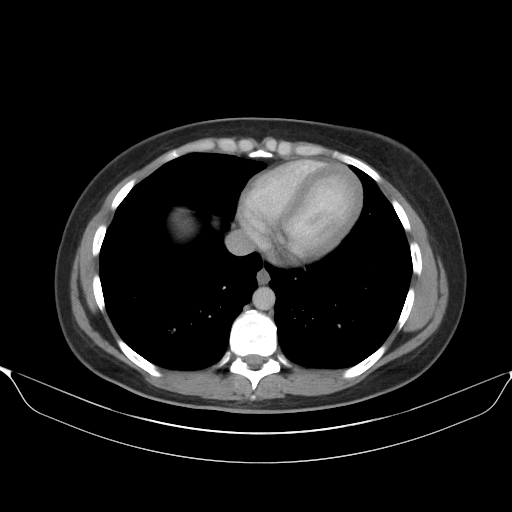
[im 85/90  lung]
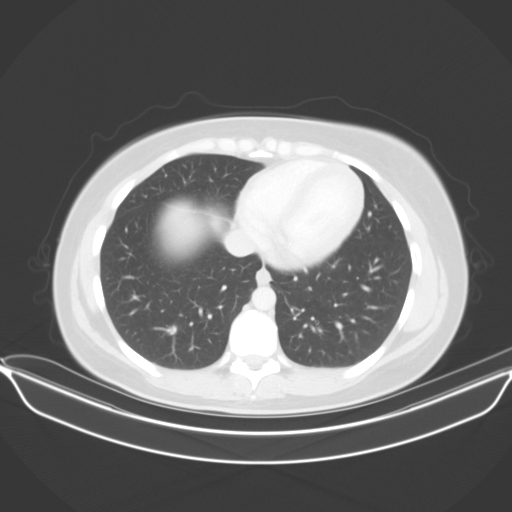

[13 of 32 positions shown; findings below may reference images not displayed]

FINDINGS: Lower chest: Lung bases are clear.

Hepatobiliary: No focal liver lesions are appreciable. Gallbladder
wall is not appreciably thickened. There is no biliary duct
dilatation.

Pancreas: No pancreatic mass or inflammatory focus.

Spleen: No splenic lesions are appreciable.

Adrenals/Urinary Tract: Adrenals bilaterally appear unremarkable.
Kidneys bilaterally show no evident mass or hydronephrosis on either
side. No renal or ureteral calculus is demonstrable on this study.
Urinary bladder is midline with wall thickness upper normal for
degree of distention.

Stomach/Bowel: There is no appreciable bowel wall or mesenteric
thickening. There is no evident bowel obstruction. No free air or
portal venous air evident. There is moderate stool in the colon.

Vascular/Lymphatic: There is no evident abdominal aortic aneurysm.
No vascular lesions are appreciable. There is no evident adenopathy
in the abdomen or pelvis.

Reproductive: Uterus is midline. Uterus appears mildly inhomogeneous
by CT. This finding potentially may indicate leiomyomatous change.
There are follicles in the ovaries bilaterally. Beyond physiologic
follicles, there is no extrauterine pelvic mass. A small amount of
fluid tracks from the left ovary into the leftward aspect of the
cul-de-sac.

Other: Appendix appears unremarkable. There is no abscess in the
abdomen or pelvis. There is no ascites beyond slight fluid in the
cul-de-sac region.

Musculoskeletal: There are no blastic or lytic bone lesions. No
intramuscular or abdominal wall lesion evident.
IMPRESSION: 1.  Question leiomyomatous change in the uterus.

2. Suspect recent ovarian cyst rupture with small amount of fluid
tracking from the left ovary into the leftward aspect of the
cul-de-sac.

3. No evident bowel obstruction. No abscess in the abdomen or
pelvis. Appendix appears normal.

4. Urinary bladder wall upper normal in thickness for degree of
distention. It may be prudent to correlate with urinalysis to
exclude possible early changes of cystitis.

## 2020-12-05 DIAGNOSIS — R635 Abnormal weight gain: Secondary | ICD-10-CM | POA: Diagnosis not present

## 2020-12-05 DIAGNOSIS — L819 Disorder of pigmentation, unspecified: Secondary | ICD-10-CM | POA: Diagnosis not present

## 2020-12-25 DIAGNOSIS — E559 Vitamin D deficiency, unspecified: Secondary | ICD-10-CM | POA: Diagnosis not present

## 2020-12-25 DIAGNOSIS — N762 Acute vulvitis: Secondary | ICD-10-CM | POA: Diagnosis not present

## 2020-12-25 DIAGNOSIS — R5383 Other fatigue: Secondary | ICD-10-CM | POA: Diagnosis not present

## 2020-12-25 DIAGNOSIS — L304 Erythema intertrigo: Secondary | ICD-10-CM | POA: Diagnosis not present

## 2021-01-23 DIAGNOSIS — L80 Vitiligo: Secondary | ICD-10-CM | POA: Diagnosis not present

## 2021-01-24 DIAGNOSIS — L80 Vitiligo: Secondary | ICD-10-CM | POA: Insufficient documentation

## 2021-01-24 DIAGNOSIS — R768 Other specified abnormal immunological findings in serum: Secondary | ICD-10-CM | POA: Insufficient documentation

## 2021-04-09 DIAGNOSIS — M2550 Pain in unspecified joint: Secondary | ICD-10-CM | POA: Diagnosis not present

## 2021-04-09 DIAGNOSIS — M545 Low back pain, unspecified: Secondary | ICD-10-CM | POA: Diagnosis not present

## 2021-04-09 DIAGNOSIS — M47814 Spondylosis without myelopathy or radiculopathy, thoracic region: Secondary | ICD-10-CM | POA: Diagnosis not present

## 2021-04-09 DIAGNOSIS — G8929 Other chronic pain: Secondary | ICD-10-CM | POA: Diagnosis not present

## 2021-04-09 DIAGNOSIS — M50322 Other cervical disc degeneration at C5-C6 level: Secondary | ICD-10-CM | POA: Diagnosis not present

## 2021-04-14 ENCOUNTER — Encounter (HOSPITAL_COMMUNITY): Payer: Self-pay

## 2021-04-14 ENCOUNTER — Emergency Department (HOSPITAL_COMMUNITY)
Admission: EM | Admit: 2021-04-14 | Discharge: 2021-04-14 | Disposition: A | Discharge status: Home / Self Care | Payer: BC Managed Care – PPO | Attending: Emergency Medicine | Admitting: Emergency Medicine

## 2021-04-14 ENCOUNTER — Other Ambulatory Visit: Payer: Self-pay

## 2021-04-14 DIAGNOSIS — Y990 Civilian activity done for income or pay: Secondary | ICD-10-CM | POA: Diagnosis not present

## 2021-04-14 DIAGNOSIS — Y92129 Unspecified place in nursing home as the place of occurrence of the external cause: Secondary | ICD-10-CM | POA: Diagnosis not present

## 2021-04-14 DIAGNOSIS — M5441 Lumbago with sciatica, right side: Secondary | ICD-10-CM | POA: Insufficient documentation

## 2021-04-14 DIAGNOSIS — X500XXA Overexertion from strenuous movement or load, initial encounter: Secondary | ICD-10-CM | POA: Diagnosis not present

## 2021-04-14 DIAGNOSIS — M545 Low back pain, unspecified: Secondary | ICD-10-CM | POA: Diagnosis not present

## 2021-04-14 MED ORDER — MELOXICAM 15 MG PO TABS: 15.0000 mg | ORAL_TABLET | Freq: Every day | ORAL | 0 refills | Status: AC

## 2021-04-14 MED ORDER — METHOCARBAMOL 500 MG PO TABS: 500.0000 mg | ORAL_TABLET | Freq: Three times a day (TID) | ORAL | 0 refills | Status: DC | PRN

## 2021-04-14 NOTE — ED Provider Notes (Signed)
?[facility] ?Provider Note ? ? ?CSN: [id number] ?Arrival date & time: 04/14/21  1308 ? ?  ? ?History ? ?Chief Complaint  ?Patient presents with  ? Back Pain  ? ? ?[name] is a 36 y.o. female.  The patient presents with a one week history of non-traumatic back pain. Her chiropractor sent her for spine imaging. She had images of SI joints, lumbar, thoracic, and cervical spine. Minimal thoracic degenerative change and minimal narrowing of disc space at C5-6 and C6-7. Patient is here today because the pain is severe. She has been taking '800mg'$  ibuprofen BID and an unknown muscle relaxer obtained by a family member. She denies saddle anesthesia, denies incontinence.  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 PMH significant for PCOS, HSV, perineal laceration repair (03/01/2020).  ? ?HPI ? ?  ? ?Home Medications ?Prior to Admission medications   ?Medication Sig Start Date End Date Taking? Authorizing Provider  ?meloxicam (MOBIC) 15 MG tablet Take 1 tablet (15 mg total) by mouth daily. 04/14/21 05/14/21 Yes Dorothyann Peng, PA-C  ?methocarbamol (ROBAXIN) 500 MG tablet Take 1 tablet (500 mg total) by mouth every 8 (eight) hours as needed for muscle spasms. 04/14/21  Yes Dorothyann Peng, PA-C  ?acetaminophen (TYLENOL) 325 MG tablet Take 2 tablets (650 mg total) by mouth every 6 (six) hours as needed (for pain scale < 4). 03/03/20   Everlene Farrier, MD  ?ibuprofen (ADVIL) 600 MG tablet Take 1 tablet (600 mg total) by mouth every 6 (six) hours as needed. 03/03/20   Everlene Farrier, MD  ?oxyCODONE (ROXICODONE) 5 MG immediate release tablet Take 1 tablet (5 mg total) by mouth every 6 (six) hours as needed for severe pain. 03/03/20   Everlene Farrier, MD  ?Prenatal Vit w/Fe-Methylfol-FA (PNV PO) Take 1 tablet by mouth daily.    [provider]  ?senna-docusate (SENOKOT-S) 8.6-50 MG tablet Take 2 tablets by mouth  daily. 03/03/20   Everlene Farrier, MD  ?   ? ?Allergies    ?Banana   ? ?Review of Systems   ?Review of Systems  ?Constitutional:  Negative for fever.  ?Respiratory:  Negative for shortness of breath.   ?Cardiovascular:  Negative for chest pain.  ?Gastrointestinal:  Negative for abdominal pain.  ?Genitourinary:  Negative for dysuria.  ?Musculoskeletal:  Positive for back pain.  ?     Patient complaining of severe low back pain on the right side with radiation to the lateral right leg  ?Neurological:  Negative for numbness.  ? ?Physical Exam ?Updated Vital Signs ?BP 139/78   Pulse 88   Temp 98.3 ?F (36.8 ?C)   Resp 14   Ht '5\' 3"'$  (1.6 m)   Wt 86.2 kg   SpO2 100%   BMI 33.66 kg/m?  ?Physical Exam ?Constitutional:   ?   General: She is not in acute distress. ?HENT:  ?   Head: Normocephalic.  ?Eyes:  ?   Extraocular Movements: Extraocular movements intact.  ?   Pupils: Pupils are equal, round, and reactive to light.  ?Cardiovascular:  ?   Rate and Rhythm: Normal rate and regular rhythm.  ?   Pulses: Normal pulses.  ?   Heart sounds: Normal heart sounds.  ?Pulmonary:  ?   Effort: Pulmonary effort is normal. No respiratory distress.  ?   Breath sounds: Normal breath sounds.  ?Abdominal:  ?   Palpations: Abdomen is soft.  ?  Tenderness: There is no abdominal tenderness.  ?Musculoskeletal:     ?   General: No tenderness (No tenderness to palpation midline spine or in the lower right lumbar region).  ?   Cervical back: Normal range of motion.  ?Skin: ?   General: Skin is warm and dry.  ?Neurological:  ?   Mental Status: She is alert.  ?   Sensory: Sensation is intact.  ?   Motor: Motor function is intact.  ?   Comments: CN II-VII, XI, XII grossly intact. Patient states that performing heel to shin with right leg causes pain in lower right back.   ? ? ?ED Results / Procedures / Treatments   ?Labs ?(all labs ordered are listed, but only abnormal results are displayed) ?Labs Reviewed - No data to  display ? ?EKG ?None ? ?Radiology ?No results found. ? ?Procedures ?Procedures  ? ? ?Medications Ordered in ED ?Medications - No data to display ? ?ED Course/ Medical Decision Making/ A&P ?  ?                        ?Medical Decision Making ? ?The patient presents with low back pain. Differential diagnosis includes lumbar muscle strain, DDD, fracture, cauda equina, and others. The patient has no new symptoms from onset. The patient remembers no injury preceding the pain. A spine series was completed at [facility] last week. I see no reason for repeat imaging. The patient has no emergent warning symptoms such as saddle anesthesia or incontinence. Her neuro exam was grossly normal. I have very low suspicion of a cauda equina diagnosis. I see no benefit to lab work today. There is no indication for admission. The patient needs outpatient orthopedic work up. The spine series was reviewed and shows no fracture, no obvious joint space loss in the lumbar spine. Her symptoms are most consistent with a lumbar radiculopathy with pain radiating down the right leg. I will discharge her home with Meloxicam for inflammation, a muscle relaxer, and information for outpatient orthopedic follow up. Return precautions provided.  ? ?Final Clinical Impression(s) / ED Diagnoses ?Final diagnoses:  ?Acute right-sided low back pain with right-sided sciatica  ? ? ?Rx / DC Orders ?ED Discharge Orders   ? ?      Ordered  ?  meloxicam (MOBIC) 15 MG tablet  Daily       ? 04/14/21 1051  ?  methocarbamol (ROBAXIN) 500 MG tablet  Every 8 hours PRN       ? 04/14/21 1051  ? ?  ?  ? ?  ? ? ?  ?Dorothyann Peng, PA-C ?04/14/21 1053 ? ?  ?Isla Pence, MD ?04/14/21 1113 ? ?

## 2021-04-14 NOTE — ED Triage Notes (Signed)
Lower back pain sacrum area burning down the front of the lateral right thigh was seen outpt. And scanned which were cleared.  Pain is getting worse and pinching more  ?

## 2021-04-14 NOTE — Discharge Instructions (Addendum)
You were seen today for low back pain. The image reports from [facility] were reviewed and show no fractures. I recommend rest and ice. I have prescribed Meloxicam to be taken once daily as an antiinflammatory. Do not take other NSAID medications while taking Meloxicam. If you notice GI upset you may stop taking the medication. I have also prescribed a muscle relaxer to be taken as directed. Do not drive while taking the muscle relaxer. If you become pregnant discontinue the usage of both medications. I have provided referral information for Dr. Sammuel Hines, a sports medicine specialist. I recommend follow up for further evaluation and management. Return to the emergency department if you develop life threats such as chest pain, shortness of breath, or altered level of consciousness.  ?

## 2021-04-18 ENCOUNTER — Ambulatory Visit (INDEPENDENT_AMBULATORY_CARE_PROVIDER_SITE_OTHER): Payer: BC Managed Care – PPO | Admitting: Orthopaedic Surgery

## 2021-04-18 ENCOUNTER — Other Ambulatory Visit: Payer: Self-pay

## 2021-04-18 DIAGNOSIS — M47818 Spondylosis without myelopathy or radiculopathy, sacral and sacrococcygeal region: Secondary | ICD-10-CM

## 2021-04-18 NOTE — Progress Notes (Signed)
? ?                            ? ? ?Chief Complaint: Right lower back SI pain ?  ? ? ?History of Present Illness:  ? ? ?[name] is a 36 y.o. female presents with right lower back and SI pain that has been going on intermittently for several months.  She states that this occurs periodically and was recently worsened at work.  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  She presented to the emergency room on 11 March she was given meloxicam as well as Robaxin.  She says that this is somewhat helpful.  She does also feel better with time.  She previously presented to [facility] for [facility] and was told that she does have SI arthritis.  She is currently getting worked up for inflammatory arthropathies as well.  She has pain with laying directly on the side.  She denies any history of injections ? ? ? ?Surgical History:   ?None ? ?PMH/PSH/Family History/Social History/Meds/Allergies:   ? ?Past Medical History:  ?Diagnosis Date  ? History of chicken pox   ? HSV infection   ? PCOS (polycystic ovarian syndrome)   ? Velamentous insertion of umbilical cord   ? ?Past Surgical History:  ?Procedure Laterality Date  ? NO PAST SURGERIES    ? PERINEAL LACERATION REPAIR N/A 03/01/2020  ? Procedure: SUTURE REPAIR PERINEAL LACERATION;  Surgeon: Louretta Shorten, MD;  Location: [facility];  Service: Gynecology;  Laterality: N/A;  ? ?Social History  ? ?Socioeconomic History  ? Marital status: Single  ?  Spouse name: Not on file  ? Number of children: Not on file  ? Years of education: 16+  ? Highest education level: Not on file  ?Occupational History  ? Occupation: Occupational therapist  ?Tobacco Use  ? Smoking status: Never  ? Smokeless tobacco: Never  ?Vaping Use  ? Vaping Use: Never used  ?Substance and Sexual Activity  ? Alcohol use: Not Currently  ?  Alcohol/week: 1.0 standard drink  ?  Types: 1 Glasses of wine per week  ? Drug use: No  ? Sexual activity: Not on file  ?Other Topics Concern  ? Not on file  ?Social History  Narrative  ? Regular exercise-yes  ? Caffeine Use-yes  ? ?Social Determinants of Health  ? ?Financial Resource Strain: Not on file  ?Food Insecurity: Not on file  ?Transportation Needs: Not on file  ?Physical Activity: Not on file  ?Stress: Not on file  ?Social Connections: Not on file  ? ?Family History  ?Problem Relation Age of Onset  ? Cancer Mother   ?     Cervical Cancer  ? Hepatitis C Father   ? Thyroid disease Maternal Aunt   ? Thyroid disease Maternal Grandmother   ? Stroke Neg Hx   ? Hypertension Neg Hx   ? Hyperlipidemia Neg Hx   ? Early death Neg Hx   ? ?Allergies  ?Allergen Reactions  ? Banana Nausea And Vomiting  ? ?Current Outpatient Medications  ?Medication Sig Dispense Refill  ? acetaminophen (TYLENOL) 325 MG tablet Take 2 tablets (650 mg total) by mouth every 6 (six) hours as needed (for pain scale < 4). 60 tablet 0  ? ibuprofen (ADVIL) 600 MG tablet Take 1 tablet (600 mg total) by mouth every 6 (six) hours as needed. 60 tablet 0  ? meloxicam (MOBIC) 15 MG tablet  Take 1 tablet (15 mg total) by mouth daily. 30 tablet 0  ? methocarbamol (ROBAXIN) 500 MG tablet Take 1 tablet (500 mg total) by mouth every 8 (eight) hours as needed for muscle spasms. 20 tablet 0  ? oxyCODONE (ROXICODONE) 5 MG immediate release tablet Take 1 tablet (5 mg total) by mouth every 6 (six) hours as needed for severe pain. 20 tablet 0  ? Prenatal Vit w/Fe-Methylfol-FA (PNV PO) Take 1 tablet by mouth daily.    ? senna-docusate (SENOKOT-S) 8.6-50 MG tablet Take 2 tablets by mouth daily. 60 tablet 0  ? ?No current facility-administered medications for this visit.  ? ?No results found. ? ?Review of Systems:   ?A ROS was performed including pertinent positives and negatives as documented in the HPI. ? ?Physical Exam :   ?Constitutional: NAD and appears stated age ?Neurological: Alert and oriented ?Psych: Appropriate affect and cooperative ?unknown if currently breastfeeding.  ? ?Comprehensive Musculoskeletal Exam:   ? ?Inspection  Right Left  ?Skin No atrophy or gross abnormalities appreciated No atrophy or gross abnormalities appreciated  ?Palpation    ?Tenderness None None  ?Crepitus None None  ?Range of Motion    ?Flexion (passive) 120 120  ?Extension 30 30  ?IR 30 30  ?ER 45 45  ?Strength    ?Flexion  5/5 5/5  ?Extension 5/5 5/5  ?Special Tests    ?FABER Negative Negative  ?FADIR Negative Negative  ?ER Lag/Capsular Insufficiency Negative Negative  ?Instability Negative Negative  ?Sacroiliac pain Positive Negative   ?Instability    ?Generalized Laxity No No  ?Neurologic    ?sciatic, femoral, obturator nerves intact to light sensation  ?Vascular/Lymphatic    ?DP pulse 2+ 2+  ?Lumbar Exam    ?Patient has symmetric lumbar range of motion with negative pain referral to hip  ? ? ? ?Imaging:   ? ? ?I personally reviewed and interpreted the radiographs. ? ? ?Assessment:   ?36 year old female with lower back pain consistent with muscular type pain as well as SI pain on the right.  This time I have offered her an SI injection.  She would like to defer this and continue to work on physical therapy.  I will also shoulder couple resources for a home lumbar strengthening program which she can work on.  I am asked to work on these 3-4 times a week.  She will see her back on an as-needed basis. ? ?Plan :   ? ?-Return to clinic as needed ? ? ? ? ?I personally saw and evaluated the patient, and participated in the management and treatment plan. ? ?Vanetta Mulders, MD ?Attending Physician, Orthopedic Surgery ? ?This document was dictated using Systems analyst. A reasonable attempt at proof reading has been made to minimize errors. ?

## 2021-04-25 DIAGNOSIS — N760 Acute vaginitis: Secondary | ICD-10-CM | POA: Diagnosis not present

## 2021-05-21 ENCOUNTER — Ambulatory Visit (INDEPENDENT_AMBULATORY_CARE_PROVIDER_SITE_OTHER): Payer: No Typology Code available for payment source | Admitting: Internal Medicine

## 2021-05-21 ENCOUNTER — Encounter: Payer: Self-pay | Admitting: Internal Medicine

## 2021-05-21 VITALS — BP 116/70 | HR 94 | Ht 63.0 in | Wt 189.6 lb

## 2021-05-21 DIAGNOSIS — E063 Autoimmune thyroiditis: Secondary | ICD-10-CM | POA: Diagnosis not present

## 2021-05-21 LAB — T4, FREE: Free T4: 0.69 ng/dL (ref 0.60–1.60)

## 2021-05-21 LAB — TSH: TSH: 2.47 u[IU]/mL (ref 0.35–5.50)

## 2021-05-21 NOTE — Patient Instructions (Addendum)
Hashimoto's Disease is an autoimmune - mediated destruction of the thyroid gland. The usual course of Hashimoto's thyroiditis is the gradual loss of thyroid function. Overt hypothyroidism occurs at a rate of ~ 5% per year.  ? ? ? ? ?Please have your thyroid checked at your Primary Care Physician's office at least once  year ?

## 2021-05-21 NOTE — Progress Notes (Signed)
? ? ?Name: [name]  ?MRN/ DOB: [id number], 1985-04-10    ?Age/ Sex: 36 y.o., female   ? ?PCP: Lujean Amel, MD   ?Reason for Endocrinology Evaluation: Hashimoto's disease  ?   ?Date of Initial Endocrinology Evaluation: 05/21/2021   ? ? ?HPI: ?Ms. [name] is a 35 y.o. female with a past medical history of Hashimoto's disease and Vitiligo . The patient presented for initial endocrinology clinic visit on 05/21/2021 for consultative assistance with her Hashimoto's disease.  ? ?Pt has been noted with elevated  Anti TPO Ab in 01/2021 at 222 IU/mL ( reference 0-34) during evaluation for Vitiligo at the dermatology office.  ? ? ? ?Of note, the pt has been evaluated by Dr. Cruzita Lederer in 2013 for hyperandrogenism , she was diagnosed with PCOS at the time with elevated testosterone 73.90  ng/dL, normal TFT's, A1c, Prolactin and 17-OH progesterone  ? ? ?Weight has been fluctuating  ?She is S/P delivery 02/29/2020 , since than she has noted  swelling around her legs and face as well as brain fog and fatigue.  ?Post-delivery her TSh was 0.26 uIU/mL in 05/2020 ? ?By May , 2022 it normalized.  ? ?She does not sleep at night  ?Snores occasionally  ?Denies constipation or diarrhea  ?She had COVID in 2020 - no symptoms and again after delivery  ? ?Denies local neck symptoms  ?No COC's  ?She is not breast feeding  ?No Fh of thyroid disease  ? ?Brother with Vitiglo  ? ?She first noted vitiligo in 2018 ? ? ? ? ? ? ?HISTORY:  ?Past Medical History:  ?Past Medical History:  ?Diagnosis Date  ? History of chicken pox   ? HSV infection   ? PCOS (polycystic ovarian syndrome)   ? Velamentous insertion of umbilical cord   ? ?Past Surgical History:  ?Past Surgical History:  ?Procedure Laterality Date  ? NO PAST SURGERIES    ? PERINEAL LACERATION REPAIR N/A 03/01/2020  ? Procedure: SUTURE REPAIR PERINEAL LACERATION;  Surgeon: Louretta Shorten, MD;  Location: [facility];  Service: Gynecology;  Laterality: N/A;  ?  ?Social History:  reports that  she has never smoked. She has never used smokeless tobacco. She reports that she does not currently use alcohol after a past usage of about 1.0 standard drink per week. She reports that she does not use drugs. ?Family History: family history includes Cancer in her mother; Hepatitis C in her father; Thyroid disease in her maternal aunt and maternal grandmother. ? ? ?HOME MEDICATIONS: ?Allergies as of 05/21/2021   ? ?   Reactions  ? Banana Nausea And Vomiting  ? ?  ? ?  ?Medication List  ?  ? ?  ? Accurate as of May 21, 2021  8:22 AM. If you have any questions, ask your nurse or doctor.  ?  ?  ? ?  ? ?STOP taking these medications   ? ?ibuprofen 600 MG tablet ?Commonly known as: ADVIL ?Stopped by: Dorita Sciara, MD ?  ?oxyCODONE 5 MG immediate release tablet ?Commonly known as: Roxicodone ?Stopped by: Dorita Sciara, MD ?  ?PNV PO ?Stopped by: Dorita Sciara, MD ?  ? ?  ? ?TAKE these medications   ? ?acetaminophen 325 MG tablet ?Commonly known as: Tylenol ?Take 2 tablets (650 mg total) by mouth every 6 (six) hours as needed (for pain scale < 4). ?  ?Cholecalciferol 125 MCG (5000 UT) Tabs ?Take 1 tablet by mouth daily at 6 (six) AM. ?  ?  Fish Oil 1000 MG Caps ?Take 1 each by mouth. ?  ?MAGNESIUM GLYCINATE PO ?Take by mouth. ?  ?methocarbamol 500 MG tablet ?Commonly known as: ROBAXIN ?Take 1 tablet (500 mg total) by mouth every 8 (eight) hours as needed for muscle spasms. ?  ?NON FORMULARY ?SELENIUM 200MCG ?  ?PRENATAL/FOLIC ACID+DHA PO ?Take 1 tablet by mouth daily at 6 (six) AM. ?  ?PROBIOTIC DAILY PO ?Take 1 capsule by mouth daily at 6 (six) AM. ?  ?senna-docusate 8.6-50 MG tablet ?Commonly known as: Senokot-S ?Take 2 tablets by mouth daily. ?  ?Tyrosine 500 MG Tabs ?Take by mouth. ?  ? ?  ?  ? ? ?REVIEW OF SYSTEMS: ?A comprehensive ROS was conducted with the patient and is negative except as per HPI  ? ? ?OBJECTIVE:  ?VS: BP 116/70 (BP Location: Left Arm, Patient Position: Sitting, Cuff Size:  Small)   Pulse 94   Ht '5\' 3"'$  (1.6 m)   Wt 189 lb 9.6 oz (86 kg)   SpO2 98%   BMI 33.59 kg/m?   ? ?Wt Readings from Last 3 Encounters:  ?05/21/21 189 lb 9.6 oz (86 kg)  ?04/14/21 190 lb (86.2 kg)  ?02/29/20 220 lb 4.8 oz (99.9 kg)  ? ? ? ?EXAM: ?General: Pt appears well and is in NAD  ?Neck: General: Supple without adenopathy. ?Thyroid: Thyroid size normal.  No goiter or nodules appreciated.   ?Lungs: Clear with good BS bilat with no rales, rhonchi, or wheezes  ?Heart: Auscultation: RRR.  ?Abdomen: Normoactive bowel sounds, soft, nontender, without masses or organomegaly palpable  ?Extremities:  ?BL LE: No pretibial edema normal ROM and strength.  ?Skin: Patient has a faintly hypopigmented area in the upper middle of her back, and able to localize vitiligo patches on arms or neck  ?Mental Status: Judgment, insight: Intact ?Orientation: Oriented to time, place, and person ?Mood and affect: No depression, anxiety, or agitation  ? ? ? ?DATA REVIEWED: ? ? ? Latest Reference Range & Units 05/21/21 08:57  ?TSH 0.35 - 5.50 uIU/mL 2.47  ?T4,Free(Direct) 0.60 - 1.60 ng/dL 0.69  ? ? ? ? ?12/05/2020 ?BUN/Cr 14/0.710 ?GFR 113 ?A1c 5.1%  ?TSH 3.020 uIU/mL  ? ? ? ? ?01/23/2021 ?Thyroid peroxidase AB 222 IU/mL (0-34) ? ?ASSESSMENT/PLAN/RECOMMENDATIONS:  ? ?Hashimoto's disease ? ?-Patient with multiple nonspecific symptoms ?-No local neck symptoms ?-We discussed that her TFTs have been normal, I explained to the patient that Hashimoto's Disease is an autoimmune - mediated destruction of the thyroid gland. The usual course of Hashimoto's thyroiditis is the gradual loss of thyroid function. Overt hypothyroidism occurs at a rate of ~ 5% per year.  ?-Patient questioning why could I start her on LT-for replacement since she could develop a condition down the road, but I gave her an example of somebody with strong family history of HTN, as long as there BP is normal we do not start them on antihypertensive medications unless they  develop the condition and this applies to thyroid conditions as well ?-Patient request anti-TPO antibody repeat, this would not change  medical management at this time ? ? ?2.  Fatigue: ? ?-We discussed differential diagnosis to include lack of restful sleep (patient endorses insomnia with difficulty to sleep and difficulty maintaining sleep), we also discussed differential diagnosis of OSA (occasional snoring), we also discussed symptoms of long COVID (since brain fog and memory issues) which happened to coincide with her delivery ? ? ?3.  Brain fog: ? ? ?-We discussed that some people  with thyroid disease do report brain fog but unfortunately there is no specific treatment for this we also discussed people with long COVID have developed brain fog symptoms.  Again no specific treatment for this.  I have reviewed literature results of thyroid related brain fog to include meditation, counseling, exercise, proper sleep hygiene but LT-4 replacement have not shown to improve brain fog symptoms related to thyroid and certainly not with long COVID symptoms.. ? ? ?4.Inability to lose weight : ? ?-She has not been able to lose weight, and review of her flowsheet she has been noted with fluctuating BG's since delivery of her baby ?-Per patient she is on gluten and dairy free diet, and she eats a  healthy diet , I have recommended a consultation weight loss clinic as this is beyond my scope ? ?Patient may return to the care of her PCP, I would recommend annual TFTs or sooner if clinically indicated ? ? ? ?4.  Vitiligo: ? ?-She has been diagnosed with vitiligo , she follows with dermatology ?-We discussed that this is a hereditary immune condition where there are antibodies that attack melanocytes and cause hyperpigmentation ?-We also discussed that this is the same analogy with Hashimoto's, where TPO antibodies could attack and distract thyroid gland ?-We also discussed that patients with autoimmune conditions tend to cluster  other and different autoimmune conditions and will need to be treated accordingly ? ? ? ?Signed electronically by: ?Abby Nena Jordan, MD ? ?[facility]  ?[facility] ?[place]

## 2021-05-23 LAB — THYROID PEROXIDASE ANTIBODY: Thyroperoxidase Ab SerPl-aCnc: >900 IU/mL — ABNORMAL HIGH (ref <9)

## 2021-07-31 DIAGNOSIS — K219 Gastro-esophageal reflux disease without esophagitis: Secondary | ICD-10-CM | POA: Insufficient documentation

## 2021-07-31 DIAGNOSIS — F419 Anxiety disorder, unspecified: Secondary | ICD-10-CM | POA: Insufficient documentation

## 2021-07-31 DIAGNOSIS — L409 Psoriasis, unspecified: Secondary | ICD-10-CM | POA: Insufficient documentation

## 2021-07-31 DIAGNOSIS — O30009 Twin pregnancy, unspecified number of placenta and unspecified number of amniotic sacs, unspecified trimester: Secondary | ICD-10-CM | POA: Insufficient documentation

## 2021-07-31 DIAGNOSIS — E0590 Thyrotoxicosis, unspecified without thyrotoxic crisis or storm: Secondary | ICD-10-CM | POA: Insufficient documentation

## 2021-07-31 DIAGNOSIS — E282 Polycystic ovarian syndrome: Secondary | ICD-10-CM | POA: Insufficient documentation

## 2021-07-31 DIAGNOSIS — A6000 Herpesviral infection of urogenital system, unspecified: Secondary | ICD-10-CM | POA: Insufficient documentation

## 2021-07-31 DIAGNOSIS — Z8742 Personal history of other diseases of the female genital tract: Secondary | ICD-10-CM | POA: Insufficient documentation

## 2021-08-01 ENCOUNTER — Encounter: Payer: Self-pay | Admitting: Orthopedic Surgery

## 2021-08-02 ENCOUNTER — Ambulatory Visit: Payer: No Typology Code available for payment source | Admitting: Orthopedic Surgery

## 2021-08-02 ENCOUNTER — Encounter: Payer: Self-pay | Admitting: Orthopedic Surgery

## 2021-08-02 VITALS — BP 122/72 | HR 88 | Temp 97.3°F | Resp 18 | Ht 63.0 in | Wt 188.4 lb

## 2021-08-02 DIAGNOSIS — Z6833 Body mass index (BMI) 33.0-33.9, adult: Secondary | ICD-10-CM

## 2021-08-02 DIAGNOSIS — M546 Pain in thoracic spine: Secondary | ICD-10-CM

## 2021-08-02 DIAGNOSIS — L80 Vitiligo: Secondary | ICD-10-CM

## 2021-08-02 DIAGNOSIS — E663 Overweight: Secondary | ICD-10-CM | POA: Diagnosis not present

## 2021-08-02 DIAGNOSIS — E063 Autoimmune thyroiditis: Secondary | ICD-10-CM

## 2021-08-02 DIAGNOSIS — R002 Palpitations: Secondary | ICD-10-CM | POA: Diagnosis not present

## 2021-08-02 DIAGNOSIS — F419 Anxiety disorder, unspecified: Secondary | ICD-10-CM

## 2021-08-02 DIAGNOSIS — G8929 Other chronic pain: Secondary | ICD-10-CM

## 2021-08-02 DIAGNOSIS — M47818 Spondylosis without myelopathy or radiculopathy, sacral and sacrococcygeal region: Secondary | ICD-10-CM

## 2021-08-02 DIAGNOSIS — M461 Sacroiliitis, not elsewhere classified: Secondary | ICD-10-CM

## 2021-08-02 MED ORDER — LIDOCAINE 4 % EX PTCH: 1.0000 | MEDICATED_PATCH | CUTANEOUS | 1 refills | Status: DC

## 2021-08-02 NOTE — Patient Instructions (Addendum)
Recommend purchasing voltaren gel- over the counter- topical Advil (always take with food)- may also try 2 alleve  Will refer to healthy weight and wellness- [phone]  Continue diet and exercise  Talk with Lala Lund about her Chiropractor  Contact Dr. Sammuel Hines about increased back pain  Think about completing living will

## 2021-08-02 NOTE — Progress Notes (Signed)
Careteam: Patient Care Team: Yvonna Alanis, NP as PCP - General (Adult Health Nurse Practitioner)  Seen by: Windell Moulding, AGNP-C  PLACE OF SERVICE:  [place] Directive information Does Patient Have a Medical Advance Directive?: No, Would patient like information on creating a medical advance directive?: Yes (ED - Information included in AVS)  Allergies  Allergen Reactions   Banana Nausea And Vomiting    Chief Complaint  Patient presents with   [place] patient also having chronic back pain, and heart palpitations.patient has pill bottles at initial appointment. EKG pending     HPI: Patient is a 36 y.o. female seen today to establish at [facility].   Previous provider Dr. Lujean Amel. Works as a Secretary/administrator. In a committed relationship. 1 child- 56 months old.   Hashimoto's- evaluated by [facility] Endocr[facility]ology 05/2020, not on medication at this time, symptoms include: fatigue, weight gain, constipation, fogginess  Vitiligo- evaluated by [facility], not on treatment, does not bother her, brother also hs condition  Weight gain- onset: after childbirth, evaluated by functional medicine Leda Min 05/2021- extensive lab work performed including hormone levels- scanned into EPIC, following no gluten/dairy/light carb diet, eats between 8 am and 6 pm, does not drink soda, stopped coffee, drinking 75-80 oz water/day, walks 30 min daily, lifts 30 lbs kettle bells 3-5x/week, does mobility stretches  Chronic low back pain/ SI joint arthritis- had on and off for years, thoracic pain increased in past few months, tightness between scapulas, pain worse at night, no radiation,no issues with bowels/bladder, she does see a PT every few months, she has tried Naltrexone for the past month- unsure if helping, she has been evaluated Dr. Sammuel Hines (orthopedics) and chiropractor  Palpitations- see below  Anxiety- reports palpitations at rest daily or QOD, she  repots not feeling well when this occurs, describes herself as anxious, not on medication, reports increased anxiety since health conditions began, no recent panic attacks   Past surgery:  Perineal laceration repair- 03/01/2020  No past hospitalizations or injuries.   LMP: 06/27, cycle lasts about 7 days, has gynecologist Programme researcher, [employer]), last PAP 2021  Family history:  Mother aged 46- Lyme disease, fibromyalgia, arthritis, HTN, cervical cancer, depression/anxiety  Father aged 57- hepatitis C (remission), cirrhosis s/p liver transplant, HTN,   5 Brothers- ages range 49-29 years, does not know detailed health history   Breast (maternal aunt) and lung cancer (paternal grandfather) on mothers side    Tobacco use: smoked about 20 cigarettes in whole life, exposure to second hand smoke since childhood Alcohol use: no past use or abuse Illicit drug use: no past use or abuse  Dentist: last dental exam 2022 Eye exam: wears glasses, cannot recall    Review of Systems:  Review of Systems  Constitutional:  Positive for malaise/fatigue. Negative for chills, fever and weight loss.  HENT:  Negative for sore throat and tinnitus.   Eyes:  Negative for blurred vision and double vision.  Respiratory:  Negative for cough, shortness of breath and wheezing.   Cardiovascular:  Positive for palpitations. Negative for chest pain and leg swelling.  Gastrointestinal:  Positive for constipation. Negative for abdominal pain, blood in stool, diarrhea, heartburn, nausea and vomiting.  Genitourinary:  Negative for dysuria, frequency and hematuria.  Musculoskeletal:  Positive for back pain. Negative for falls.  Skin:  Negative for itching and rash.  Neurological:  Positive for weakness. Negative for dizziness and headaches.  Psychiatric/Behavioral:  Negative for depression, hallucinations and substance abuse. The patient is nervous/anxious. The patient does not have insomnia.     Past Medical History:   Diagnosis Date   History of chicken pox    HSV infection    PCOS (polycystic ovarian syndrome)    Velamentous insertion of umbilical cord    Past Surgical History:  Procedure Laterality Date   NO PAST SURGERIES     PERINEAL LACERATION REPAIR N/A 03/01/2020   Procedure: SUTURE REPAIR PERINEAL LACERATION;  Surgeon: Louretta Shorten, MD;  Location: [facility];  Service: Gynecology;  Laterality: N/A;   Social History:   reports that she has never smoked. She has never used smokeless tobacco. She reports that she does not currently use alcohol after a past usage of about 1.0 standard drink of alcohol per week. She reports that she does not use drugs.  Family History  Problem Relation Age of Onset   Cancer Mother        Cervical Cancer   Hepatitis C Father    Thyroid disease Maternal Aunt    Thyroid disease Maternal Grandmother    Stroke Neg Hx    Hypertension Neg Hx    Hyperlipidemia Neg Hx    Early death Neg Hx     Medications: Patient's Medications  New Prescriptions   No medications on file  Previous Medications   ACETAMINOPHEN (TYLENOL) 325 MG TABLET    Take 2 tablets (650 mg total) by mouth every 6 (six) hours as needed (for pain scale < 4).   CHOLECALCIFEROL 125 MCG (5000 UT) TABS    Take 1 tablet by mouth daily at 6 (six) AM.   MAGNESIUM GLYCINATE PO    Take by mouth.   METHOCARBAMOL (ROBAXIN) 500 MG TABLET    Take 1 tablet (500 mg total) by mouth every 8 (eight) hours as needed for muscle spasms.   NON FORMULARY    SELENIUM 200MCG   OMEGA-3 FATTY ACIDS (FISH OIL) 1000 MG CAPS    Take 1 each by mouth.   PRENATAL MV-MIN-FE FUM-FA-DHA (PRENATAL/FOLIC ACID+DHA PO)    Take 1 tablet by mouth daily at 6 (six) AM.   PROBIOTIC PRODUCT (PROBIOTIC DAILY PO)    Take 1 capsule by mouth daily at 6 (six) AM.   SENNA-DOCUSATE (SENOKOT-S) 8.6-50 MG TABLET    Take 2 tablets by mouth daily.   TYROSINE 500 MG TABS    Take by mouth.  Modified Medications   No medications on file  Discontinued  Medications   No medications on file    Physical Exam:  Vitals:   08/02/21 0846  BP: 122/72  Pulse: 88  Resp: 18  Temp: (!) 97.3 F (36.3 C)  TempSrc: Temporal  SpO2: 98%  Weight: 188 lb 6.4 oz (85.5 kg)  Height: '5\' 3"'$  (1.6 m)   Body mass index is 33.37 kg/m. Wt Readings from Last 3 Encounters:  08/02/21 188 lb 6.4 oz (85.5 kg)  05/21/21 189 lb 9.6 oz (86 kg)  04/14/21 190 lb (86.2 kg)    Physical Exam Vitals reviewed.  Constitutional:      General: She is not in acute distress. HENT:     Head: Normocephalic.     Nose: Nose normal.     Mouth/Throat:     Mouth: Mucous membranes are moist.  Eyes:     General:        Right eye: No discharge.        Left eye: No discharge.  Cardiovascular:  Rate and Rhythm: Normal rate and regular rhythm.     Pulses: Normal pulses.     Heart sounds: Normal heart sounds. No murmur heard. Pulmonary:     Effort: Pulmonary effort is normal. No respiratory distress.     Breath sounds: Normal breath sounds. No wheezing.  Abdominal:     General: Bowel sounds are normal. There is no distension.     Palpations: Abdomen is soft.     Tenderness: There is no abdominal tenderness.  Musculoskeletal:     Cervical back: Normal and neck supple.     Thoracic back: No swelling, deformity or tenderness. Normal range of motion. No scoliosis.     Lumbar back: No swelling, deformity, spasms or tenderness. Normal range of motion. No scoliosis.     Right lower leg: No edema.     Left lower leg: No edema.  Skin:    General: Skin is warm and dry.     Capillary Refill: Capillary refill takes less than 2 seconds.  Neurological:     General: No focal deficit present.     Mental Status: She is alert and oriented to person, place, and time.  Psychiatric:        Behavior: Behavior normal.     Labs reviewed: Basic Metabolic Panel: Recent Labs    05/21/21 0857  TSH 2.47   Liver Function Tests: No results for input(s): "AST", "ALT", "ALKPHOS",  "BILITOT", "PROT", "ALBUMIN" in the last 8760 hours. No results for input(s): "LIPASE", "AMYLASE" in the last 8760 hours. No results for input(s): "AMMONIA" in the last 8760 hours. CBC: No results for input(s): "WBC", "NEUTROABS", "HGB", "HCT", "MCV", "PLT" in the last 8760 hours. Lipid Panel: No results for input(s): "CHOL", "HDL", "LDLCALC", "TRIG", "CHOLHDL", "LDLDIRECT" in the last 8760 hours. TSH: Recent Labs    05/21/21 0857  TSH 2.47   A1C: Lab Results  Component Value Date   HGBA1C 5.2 02/04/2012     Assessment/Plan 1. Heart palpitations - occurring daily or QOD - admits to anxiety - exam unremarkable - consider EKG next visit if they persist  2. Overweight - BMI 33.37 - trouble loosing weight after pregnancy - exercises daily and watches diet - evaluated by functional medicine- cortisol level elevated, no insulin resistance - Amb Ref to Medical Weight Management  3. BMI 33.0-33.9,adult - see above - Amb Ref to Medical Weight Management  4. Chronic midline thoracic back pain - exam unremarkable - pain worse at night - cont walking/stretching - limit exercise with weight lifting - recommend seeing orthopedics - on Naltrexone- she is considering stopping - lidocaine 4 %; Place 1 patch onto the skin daily.  Dispense: 30 patch; Refill: 1  5. SI joint arthritis - see above  6. Hashimoto's disease - TPO > 900, TSH 2.47, T4 0.69 05/21/2021 - evaluated by endocrinology- does not recommend medication at this time - TSH- future  7. Vitiligo - evaluated by dermatology  8. Anxiety - no recent panic attacks - increased anxiety lately - reports some palpitations - recommend deep breathing/meditation  Total time: 44 minutes. Greater than 50% of total time spent doing patient education regarding health maintenance, weight gain, Hashimoto's, anxiety, and palpitations.   Next appt:08/30/2021   Niel Hummer  [facility] [phone]

## 2021-08-24 ENCOUNTER — Ambulatory Visit (INDEPENDENT_AMBULATORY_CARE_PROVIDER_SITE_OTHER): Payer: No Typology Code available for payment source

## 2021-08-24 ENCOUNTER — Ambulatory Visit (INDEPENDENT_AMBULATORY_CARE_PROVIDER_SITE_OTHER): Payer: No Typology Code available for payment source | Admitting: Orthopaedic Surgery

## 2021-08-24 DIAGNOSIS — M546 Pain in thoracic spine: Secondary | ICD-10-CM

## 2021-08-24 NOTE — Progress Notes (Signed)
Chief Complaint: Thoracic back pain     History of Present Illness:    [name] is a 36 y.o. female presents today for evaluation of her thoracic back pain.  She states that she has been feeling better with regard to her lower back as well as SI joint.  For the last several weeks she has really had to be stretching out her back and is currently being worked up for autoimmune disease.  She is here today for further assessment.  She has been working on stretching this and doing back exercises which do help somewhat.    Surgical History:   None  PMH/PSH/Family History/Social History/Meds/Allergies:    Past Medical History:  Diagnosis Date   History of chicken pox    HSV infection    PCOS (polycystic ovarian syndrome)    Velamentous insertion of umbilical cord    Past Surgical History:  Procedure Laterality Date   NO PAST SURGERIES     PERINEAL LACERATION REPAIR N/A 03/01/2020   Procedure: SUTURE REPAIR PERINEAL LACERATION;  Surgeon: Louretta Shorten, MD;  Location: [facility];  Service: Gynecology;  Laterality: N/A;   Social History   Socioeconomic History   Marital status: Significant Other    Spouse name: Not on file   Number of children: Not on file   Years of education: 16+   Highest education level: Not on file  Occupational History   Occupation: Occupational therapist  Tobacco Use   Smoking status: Never   Smokeless tobacco: Never  Vaping Use   Vaping Use: Never used  Substance and Sexual Activity   Alcohol use: Not Currently    Alcohol/week: 1.0 standard drink of alcohol    Types: 1 Glasses of wine per week   Drug use: No   Sexual activity: Not on file  Other Topics Concern   Not on file  Social History Narrative   Regular exercise-yes Caffeine Use-yes   Social Determinants of Health   Financial Resource Strain: Not on file  Food Insecurity: Not on file  Transportation Needs: Not on file  Physical Activity: Not on  file  Stress: Not on file  Social Connections: Not on file   Family History  Problem Relation Age of Onset   Cancer Mother        Cervical Cancer   Hepatitis C Father    Thyroid disease Maternal Aunt    Thyroid disease Maternal Grandmother    Stroke Neg Hx    Hypertension Neg Hx    Hyperlipidemia Neg Hx    Early death Neg Hx    Allergies  Allergen Reactions   Banana Nausea And Vomiting   Current Outpatient Medications  Medication Sig Dispense Refill   Cholecalciferol 125 MCG (5000 UT) TABS Take 1 tablet by mouth daily at 6 (six) AM.     lidocaine 4 % Place 1 patch onto the skin daily. 30 patch 1   MAGNESIUM GLYCINATE PO Take by mouth.     methocarbamol (ROBAXIN) 500 MG tablet Take 1 tablet (500 mg total) by mouth every 8 (eight) hours as needed for muscle spasms. 20 tablet 0   Misc Natural Products (ADRENAL PO) Take by mouth.     Naltrexone HCl, Pain, 1.5 MG CAPS Take 1 capsule by mouth daily.     NON FORMULARY SELENIUM  200MCG     Omega-3 Fatty Acids (FISH OIL) 1000 MG CAPS Take 1 each by mouth.     Prenatal MV-Min-Fe Fum-FA-DHA (PRENATAL/FOLIC ACID+DHA PO) Take 1 tablet by mouth daily at 6 (six) AM.     Probiotic Product (PROBIOTIC DAILY PO) Take 1 capsule by mouth daily at 6 (six) AM.     No current facility-administered medications for this visit.   No results found.  Review of Systems:   A ROS was performed including pertinent positives and negatives as documented in the HPI.  Physical Exam :   Constitutional: NAD and appears stated age Neurological: Alert and oriented Psych: Appropriate affect and cooperative unknown if currently breastfeeding.   Comprehensive Musculoskeletal Exam:    Mild kyphotic deformity seen clinically.  There is tenderness about the paraspinal musculature and right-sided rib cage  Imaging:   Xray (thoracic spine 4 views): There is idiopathic kyphosis which is mild in nature   I personally reviewed and interpreted the  radiographs.   Assessment:   36 y.o. female with idiopathic kyphosis of her thoracic spine.  This time I have advised that physical therapy for strengthening of her back musculature would hopefully give her the most relief.  I have also advised her on bracing and potential figure-of-eight brace.  Lastly I have shown her an inversion table which she may consider to help as well.  I will plan to see her back on an as-needed basis  Plan :    -Return to clinic as needed     I personally saw and evaluated the patient, and participated in the management and treatment plan.  Vanetta Mulders, MD Attending Physician, Orthopedic Surgery  This document was dictated using Dragon voice recognition software. A reasonable attempt at proof reading has been made to minimize errors.

## 2021-08-27 ENCOUNTER — Encounter: Payer: Self-pay | Admitting: Family

## 2021-08-27 ENCOUNTER — Ambulatory Visit: Payer: No Typology Code available for payment source | Admitting: Family

## 2021-08-27 VITALS — BP 120/82 | HR 88 | Temp 96.6°F | Resp 16 | Ht 63.0 in | Wt 184.0 lb

## 2021-08-27 DIAGNOSIS — J029 Acute pharyngitis, unspecified: Secondary | ICD-10-CM | POA: Diagnosis not present

## 2021-08-27 MED ORDER — AMOXICILLIN-POT CLAVULANATE 875-125 MG PO TABS: 1.0000 | ORAL_TABLET | Freq: Two times a day (BID) | ORAL | 0 refills | Status: AC

## 2021-08-27 NOTE — Progress Notes (Signed)
Provider: Ryosuke Ericksen FNP-C  Yvonna Alanis, NP  Patient Care Team: Yvonna Alanis, NP as PCP - General (Adult Health Nurse Practitioner)  Extended Emergency Contact Information Primary Emergency Contact: Joylene Grapes Address: [address]          [place], [place] [address] Johnnette Litter of [place] Phone: [phone] Mobile Phone: [phone] Relation: Significant other Secondary Emergency Contact: Covalt,Joe  [place] Phone: [phone] Relation: Brother  Code Status:  Full Code  Goals of care: Advanced Directive information    08/27/2021    1:14 PM  Advanced Directives  Does Patient Have a Medical Advance Directive? No  Would patient like information on creating a medical advance directive? No - Patient declined     Chief Complaint  Patient presents with   Acute Visit    Patient complains of sore throat that started 08/25/2021.    HPI:  Pt is a 36 y.o. female seen today for an acute visit for evaluation of sore throat which started on 08/25/2021.Had Low grade fever yesterday.Had swelling on right throat swelling with white patches.No contact with sick person.   Past Medical History:  Diagnosis Date   History of chicken pox    HSV infection    PCOS (polycystic ovarian syndrome)    Velamentous insertion of umbilical cord    Past Surgical History:  Procedure Laterality Date   NO PAST SURGERIES     PERINEAL LACERATION REPAIR N/A 03/01/2020   Procedure: SUTURE REPAIR PERINEAL LACERATION;  Surgeon: Louretta Shorten, MD;  Location: [facility];  Service: Gynecology;  Laterality: N/A;    Allergies  Allergen Reactions   Banana Nausea And Vomiting    Outpatient Encounter Medications as of 08/27/2021  Medication Sig   Cholecalciferol 125 MCG (5000 UT) TABS Take 1 tablet by mouth daily at 6 (six) AM.   lidocaine 4 % Place 1 patch onto the skin daily.   MAGNESIUM GLYCINATE PO Take by mouth.   methocarbamol (ROBAXIN) 500 MG tablet Take  1 tablet (500 mg total) by mouth every 8 (eight) hours as needed for muscle spasms.   Misc Natural Products (ADRENAL PO) Take by mouth.   NON FORMULARY SELENIUM 200MCG   Omega-3 Fatty Acids (FISH OIL) 1000 MG CAPS Take 1 each by mouth.   Prenatal MV-Min-Fe Fum-FA-DHA (PRENATAL/FOLIC ACID+DHA PO) Take 1 tablet by mouth daily at 6 (six) AM.   Probiotic Product (PROBIOTIC DAILY PO) Take 1 capsule by mouth daily at 6 (six) AM.   [DISCONTINUED] Naltrexone HCl, Pain, 1.5 MG CAPS Take 1 capsule by mouth daily.   No facility-administered encounter medications on file as of 08/27/2021.    Review of Systems  Constitutional:  Negative for appetite change, chills and fatigue.  HENT:  Positive for sore throat. Negative for congestion, rhinorrhea, sinus pressure, sinus pain and sneezing.   Respiratory:  Negative for cough, chest tightness, shortness of breath and wheezing.   Gastrointestinal:  Negative for abdominal distention, abdominal pain, diarrhea, nausea and vomiting.  Neurological:  Negative for dizziness, light-headedness and headaches.    Immunization History  Administered Date(s) Administered   Influenza-Unspecified 12/10/2019   Moderna Covid-19 Vaccine Bivalent Booster 31yr & up 11/29/2019   Moderna Sars-Covid-2 Vaccination 03/09/2019, 04/06/2019   Tdap 12/06/2019   Pertinent  Health Maintenance Due  Topic Date Due   INFLUENZA VACCINE  09/04/2021   PAP SMEAR-Modifier  02/18/2022      03/02/2020   10:06 PM 03/03/2020    8:15  AM 04/14/2021   10:32 AM 08/02/2021    8:46 AM 08/27/2021    1:14 PM  Fall Risk  Falls in the past year?    0 0  Was there an injury with Fall?    0 0  Fall Risk Category Calculator    0 0  Fall Risk Category    Low Low  Patient Fall Risk Level Low fall risk Low fall risk Low fall risk Low fall risk Low fall risk  Patient at Risk for Falls Due to    No Fall Risks No Fall Risks  Fall risk Follow up    Falls evaluation completed Falls evaluation completed    Functional Status Survey:    Vitals:   08/27/21 1308  BP: 120/82  Pulse: 88  Resp: 16  Temp: (!) 96.6 F (35.9 C)  SpO2: 98%  Weight: 184 lb (83.5 kg)  Height: '5\' 3"'$  (1.6 m)   Body mass index is 32.59 kg/m. Physical Exam Vitals reviewed.  Constitutional:      General: She is not in acute distress.    Appearance: Normal appearance. She is normal weight. She is not ill-appearing or diaphoretic.  HENT:     Head: Normocephalic.     Right Ear: Tympanic membrane, ear canal and external ear normal. There is no impacted cerumen.     Left Ear: Tympanic membrane, ear canal and external ear normal. There is no impacted cerumen.     Nose: Nose normal. No congestion or rhinorrhea.     Mouth/Throat:     Mouth: Mucous membranes are moist.     Pharynx: Oropharynx is clear. Posterior oropharyngeal erythema present. No oropharyngeal exudate.  Eyes:     General: No scleral icterus.       Right eye: No discharge.        Left eye: No discharge.     Extraocular Movements: Extraocular movements intact.     Conjunctiva/sclera: Conjunctivae normal.     Pupils: Pupils are equal, round, and reactive to light.  Neck:     Vascular: No carotid bruit.  Cardiovascular:     Rate and Rhythm: Normal rate and regular rhythm.     Pulses: Normal pulses.     Heart sounds: Normal heart sounds. No murmur heard.    No friction rub. No gallop.  Pulmonary:     Effort: Pulmonary effort is normal. No respiratory distress.     Breath sounds: Normal breath sounds. No wheezing, rhonchi or rales.  Chest:     Chest wall: No tenderness.  Abdominal:     General: Bowel sounds are normal. There is no distension.     Palpations: Abdomen is soft. There is no mass.     Tenderness: There is no abdominal tenderness. There is no right CVA tenderness, left CVA tenderness or guarding.  Musculoskeletal:        General: No swelling or tenderness. Normal range of motion.     Cervical back: Normal range of motion. No  rigidity or tenderness.     Right lower leg: No edema.     Left lower leg: No edema.  Lymphadenopathy:     Cervical: No cervical adenopathy.  Skin:    General: Skin is warm and dry.     Coloration: Skin is not pale.     Findings: No erythema or rash.  Neurological:     Mental Status: She is alert and oriented to person, place, and time.     Motor: No weakness.  Gait: Gait normal.  Psychiatric:        Mood and Affect: Mood normal.        Speech: Speech normal.        Behavior: Behavior normal.     Labs reviewed: No results for input(s): "NA", "K", "CL", "CO2", "GLUCOSE", "BUN", "CREATININE", "CALCIUM", "MG", "PHOS" in the last 8760 hours. No results for input(s): "AST", "ALT", "ALKPHOS", "BILITOT", "PROT", "ALBUMIN" in the last 8760 hours. No results for input(s): "WBC", "NEUTROABS", "HGB", "HCT", "MCV", "PLT" in the last 8760 hours. Lab Results  Component Value Date   TSH 2.47 05/21/2021   Lab Results  Component Value Date   HGBA1C 5.2 02/04/2012   Lab Results  Component Value Date   CHOL 188 01/13/2012   HDL 64.40 01/13/2012   LDLCALC 118 (H) 01/13/2012   TRIG 29.0 01/13/2012   CHOLHDL 3 01/13/2012    Significant Diagnostic Results in last 30 days:  DG Thoracic Spine W/Swimmers  Result Date: 08/25/2021 CLINICAL DATA:  Pain. EXAM: THORACIC SPINE - 3 VIEWS COMPARISON:  None Available. FINDINGS: There is no evidence of thoracic spine fracture. Alignment is normal. Minimal degenerative endplate changes are noted in the midthoracic spine. IMPRESSION: Minimal degenerative changes in the midthoracic spine. Electronically Signed   By: Brett Fairy M.D.   On: 08/25/2021 22:29    Assessment/Plan  Acute pharyngitis, unspecified etiology Afebrile Posterior pharynx erythema - Advised to gurgle throat with warm water and salt at least once daily  - Increase fluid intake /soup/teas  - Tylenol as needed for pain  - will start on Augmentin  - Upper Respiratory Culture -  amoxicillin-clavulanate (AUGMENTIN) 875-125 MG tablet; Take 1 tablet by mouth 2 (two) times daily for 7 days.  Dispense: 14 tablet; Refill: 0 - Notify provider if symptoms worsen or fail to improve  Family/ staff Communication: Reviewed plan of care with patient verbalized understanding  Labs/tests ordered:  - Upper Respiratory Culture  Next Appointment: Return if symptoms worsen or fail to improve.    Sandrea Hughs, NP

## 2021-08-27 NOTE — Patient Instructions (Signed)
-   Gurgle throat with warm water and salt at least once daily   - take OTC tylenol as needed for pain   - Encouraged to increase fluid intake/warm tea or soup   - Notify provider if symptoms worsen or fail to improve

## 2021-08-30 ENCOUNTER — Encounter: Payer: Self-pay | Admitting: Orthopedic Surgery

## 2021-08-30 ENCOUNTER — Ambulatory Visit: Payer: No Typology Code available for payment source | Admitting: Orthopedic Surgery

## 2021-08-30 VITALS — BP 116/82 | HR 80 | Temp 97.7°F | Ht 63.0 in | Wt 187.0 lb

## 2021-08-30 DIAGNOSIS — E063 Autoimmune thyroiditis: Secondary | ICD-10-CM | POA: Diagnosis not present

## 2021-08-30 DIAGNOSIS — R002 Palpitations: Secondary | ICD-10-CM | POA: Diagnosis not present

## 2021-08-30 DIAGNOSIS — J029 Acute pharyngitis, unspecified: Secondary | ICD-10-CM

## 2021-08-30 DIAGNOSIS — M546 Pain in thoracic spine: Secondary | ICD-10-CM

## 2021-08-30 DIAGNOSIS — E663 Overweight: Secondary | ICD-10-CM | POA: Diagnosis not present

## 2021-08-30 DIAGNOSIS — F419 Anxiety disorder, unspecified: Secondary | ICD-10-CM | POA: Diagnosis not present

## 2021-08-30 DIAGNOSIS — G8929 Other chronic pain: Secondary | ICD-10-CM

## 2021-08-30 DIAGNOSIS — Z6833 Body mass index (BMI) 33.0-33.9, adult: Secondary | ICD-10-CM

## 2021-08-30 LAB — CULTURE, UPPER RESPIRATORY
MICRO NUMBER:: 13688491
SPECIMEN QUALITY:: ADEQUATE

## 2021-08-30 NOTE — Patient Instructions (Signed)
Try to give yourself time each day to de-stress

## 2021-08-30 NOTE — Progress Notes (Signed)
Careteam: Patient Care Team: Yvonna Alanis, NP as PCP - General (Adult Health Nurse Practitioner)  Seen by: Windell Moulding, AGNP-C  PLACE OF SERVICE:  [place] Directive information    Allergies  Allergen Reactions   Banana Nausea And Vomiting    Chief Complaint  Patient presents with   Medical Management of Chronic Issues    Patient presents today for 4 week f/u     HPI: Patient is a 36 y.o. female seen today for acute visit due to anxiety.   06/29 she reported increased anxiety with palpitations > 2 months, palpitations occurring at rest and with activity daily or QOD, in the past 2 weeks these episodes have become less frequent, she is very busy with work and raising 63 month old, admits to stress reduction interventions, plan to do EKG today  Sore throat 07/24, son was also sick,  given Augmentin, symptoms have improved, able to swallow fluids/foods without difficulty  Weight gain- weight unchanged, began after childbirth, eating about 1300 calories/day, very active and exercises daily, also works as a Secretary/administrator, followed by [facility]- was told she has elevated cortisol, discussed referral to [facility]   Chronic low back pain- she stopped taking Naltrexone and symptoms/overall felt better, Dr. Sammuel Hines recommended PT- f/u on prn basis  Hashimoto's- diagnosed earlier this year, evaluated by endocrine, TSH normal   Review of Systems:  Review of Systems  Constitutional:  Positive for malaise/fatigue. Negative for chills, fever and weight loss.  HENT: Negative.    Eyes: Negative.   Respiratory: Negative.    Cardiovascular:  Positive for palpitations.  Gastrointestinal: Negative.   Genitourinary: Negative.   Musculoskeletal:  Positive for back pain.  Skin: Negative.   Neurological: Negative.   Endo/Heme/Allergies: Negative.   Psychiatric/Behavioral:  The patient is nervous/anxious.     Past Medical History:  Diagnosis Date   History  of chicken pox    HSV infection    PCOS (polycystic ovarian syndrome)    Velamentous insertion of umbilical cord    Past Surgical History:  Procedure Laterality Date   NO PAST SURGERIES     PERINEAL LACERATION REPAIR N/A 03/01/2020   Procedure: SUTURE REPAIR PERINEAL LACERATION;  Surgeon: Louretta Shorten, MD;  Location: [facility];  Service: Gynecology;  Laterality: N/A;   Social History:   reports that she has never smoked. She has never used smokeless tobacco. She reports that she does not currently use alcohol after a past usage of about 1.0 standard drink of alcohol per week. She reports that she does not use drugs.  Family History  Problem Relation Age of Onset   Cancer Mother        Cervical Cancer   Hepatitis C Father    Thyroid disease Maternal Aunt    Thyroid disease Maternal Grandmother    Stroke Neg Hx    Hypertension Neg Hx    Hyperlipidemia Neg Hx    Early death Neg Hx     Medications: Patient's Medications  New Prescriptions   No medications on file  Previous Medications   AMOXICILLIN-CLAVULANATE (AUGMENTIN) 875-125 MG TABLET    Take 1 tablet by mouth 2 (two) times daily for 7 days.   CHOLECALCIFEROL 125 MCG (5000 UT) TABS    Take 1 tablet by mouth daily at 6 (six) AM.   LIDOCAINE 4 %    Place 1 patch onto the skin daily.   MAGNESIUM GLYCINATE PO    Take  by mouth.   METHOCARBAMOL (ROBAXIN) 500 MG TABLET    Take 1 tablet (500 mg total) by mouth every 8 (eight) hours as needed for muscle spasms.   MISC NATURAL PRODUCTS (ADRENAL PO)    Take by mouth.   NON FORMULARY    SELENIUM 200MCG   OMEGA-3 FATTY ACIDS (FISH OIL) 1000 MG CAPS    Take 1 each by mouth.   PRENATAL MV-MIN-FE FUM-FA-DHA (PRENATAL/FOLIC ACID+DHA PO)    Take 1 tablet by mouth daily at 6 (six) AM.   PROBIOTIC PRODUCT (PROBIOTIC DAILY PO)    Take 1 capsule by mouth daily at 6 (six) AM.  Modified Medications   No medications on file  Discontinued Medications   No medications on file    Physical  Exam:  Vitals:   08/30/21 1510  BP: 116/82  Pulse: 80  Temp: 97.7 F (36.5 C)  SpO2: 98%  Weight: 187 lb (84.8 kg)  Height: '5\' 3"'$  (1.6 m)   Body mass index is 33.13 kg/m. Wt Readings from Last 3 Encounters:  08/30/21 187 lb (84.8 kg)  08/27/21 184 lb (83.5 kg)  08/02/21 188 lb 6.4 oz (85.5 kg)    Physical Exam Vitals reviewed.  Constitutional:      General: She is not in acute distress. HENT:     Head: Normocephalic.  Eyes:     General:        Right eye: No discharge.        Left eye: No discharge.  Neck:     Thyroid: No thyroid mass, thyromegaly or thyroid tenderness.  Cardiovascular:     Rate and Rhythm: Normal rate and regular rhythm.     Pulses: Normal pulses.     Heart sounds: Normal heart sounds.  Pulmonary:     Effort: Pulmonary effort is normal.     Breath sounds: Normal breath sounds.  Abdominal:     General: Bowel sounds are normal. There is no distension.     Palpations: Abdomen is soft.     Tenderness: There is no abdominal tenderness.  Musculoskeletal:     Cervical back: Normal and neck supple.     Thoracic back: Normal.     Lumbar back: Normal.     Right lower leg: No edema.     Left lower leg: No edema.  Skin:    General: Skin is warm and dry.     Capillary Refill: Capillary refill takes less than 2 seconds.  Neurological:     General: No focal deficit present.     Mental Status: She is alert and oriented to person, place, and time.  Psychiatric:        Behavior: Behavior normal.     Labs reviewed: Basic Metabolic Panel: Recent Labs    05/21/21 0857  TSH 2.47   Liver Function Tests: No results for input(s): "AST", "ALT", "ALKPHOS", "BILITOT", "PROT", "ALBUMIN" in the last 8760 hours. No results for input(s): "LIPASE", "AMYLASE" in the last 8760 hours. No results for input(s): "AMMONIA" in the last 8760 hours. CBC: No results for input(s): "WBC", "NEUTROABS", "HGB", "HCT", "MCV", "PLT" in the last 8760 hours. Lipid Panel: No  results for input(s): "CHOL", "HDL", "LDLCALC", "TRIG", "CHOLHDL", "LDLDIRECT" in the last 8760 hours. TSH: Recent Labs    05/21/21 0857  TSH 2.47   A1C: Lab Results  Component Value Date   HGBA1C 5.2 02/04/2012     Assessment/Plan 1. Palpitations - onset > 2 months - occurs with anxiety - EKG 12-Lead-  NSR 08/30/2021  2. Hashimoto's disease - evaluated by endocrine - symptoms: fatigue, weight gain, constipation, fogginess - TSH normal at this time - TSH; Future  3. Anxiety - ongoing - discussed stress reduction interventions - not interested in medication  4. Overweight - ongoing - began after childbirth - follows low calorie diet and exercises daily - referral to weight and wellness  5. BMI 33.0-33.9,adult - see above  6. Acute pharyngitis, unspecified etiology - improved since starting Augmentin  7. Chronic midline thoracic back pain - followed by Dr. Sammuel Hines - PT recommended  Total time: 32 minutes. Greater than 50% of total time spent doing patient education regarding anxiety, palpitations, weight gain, and back pain.     Next appt: Visit date not found  [place], [facility] [phone]

## 2021-10-23 ENCOUNTER — Ambulatory Visit: Payer: No Typology Code available for payment source | Admitting: Adult Health

## 2021-10-23 ENCOUNTER — Encounter: Payer: Self-pay | Admitting: Adult Health

## 2021-10-23 VITALS — BP 118/74 | HR 72 | Temp 97.8°F | Resp 18 | Ht 63.0 in | Wt 187.0 lb

## 2021-10-23 DIAGNOSIS — H1013 Acute atopic conjunctivitis, bilateral: Secondary | ICD-10-CM | POA: Diagnosis not present

## 2021-10-23 MED ORDER — OLOPATADINE HCL 0.1 % OP SOLN: 1.0000 [drp] | Freq: Two times a day (BID) | OPHTHALMIC | 0 refills | Status: AC

## 2021-10-23 NOTE — Patient Instructions (Signed)
Allergic Conjunctivitis, Adult  Allergic conjunctivitis is inflammation of the clear membrane that covers the white part of your eye and the inner surface of your eyelid. This area is called the conjunctiva. This condition can make your eye red or pink. It can also make your eye feel itchy. This condition is not contagious. This means that it cannot be spread from one person to another person. What are the causes? This condition is caused by allergens. These are things that can cause an allergic reaction in some people. Common allergens include: Outdoor allergens, such as: Pollen, including pollen from grass and weeds. Mold. Car fumes. Indoor allergens, such as: Dust. Smoke. Mold. Proteins in a pet's pee (urine), saliva, or dander. Proteins that build up in contact lenses. What increases the risk? You are more likely to develop this condition if you have a family history of these things: Allergies. Conditions that you get because of allergens, such as asthma or inflammation of the skin (eczema). What are the signs or symptoms? Symptoms of this condition include eyes that are: Itchy. Red. Watery. Puffy. Your eyes may also: Sting or burn. Have clear fluid draining from them. Have thick mucus coming from them. This happens in severe cases. How is this treated? Treatment for this condition may include: Using cold, wet cloths (cold compresses) to soothe itching and swelling. Washing the face, hair, and clothing to remove allergens. Using eye drops. These may include: Eye drops that block allergies. Eye drops that reduce swelling and irritation. Steroid eye drops if other treatments have not worked. Oral antihistamine medicines. These medicines lessen your allergies. You may need these if eye drops do not help or are difficult to use. Air purifier at home and work. Wrap around sunglasses. This may help to block allergens from reaching the eye. Not wearing contact lenses, if the  doctor has found that contact lenses caused your symptoms. Use daily wear disposal contact lenses instead. Follow these instructions at home: Eye care Place a cool, clean washcloth on your eye for 10-20 minutes. Do this 3-4 times a day. Do not touch or rub your eyes. Do not wear contact lenses until the inflammation is gone. Wear glasses instead. Do not wear eye makeup until the inflammation is gone. General instructions Try not to be around things that you are allergic to. Take or apply over-the-counter and prescription medicines only as told by your doctor. These include any eye drops. Drink enough fluid to keep your pee pale yellow. Keep all follow-up visits. Contact a doctor if: Your symptoms get worse. Your symptoms do not get better with treatment. You have mild eye pain. You are sensitive to light. You have spots or blisters on your eyes. You have pus coming from your eye. You have a fever. Get help right away if: You have redness, swelling, or other symptoms in only one eye. You cannot see well. You have other vision changes. You have very bad eye pain. Summary Allergic conjunctivitis is caused by allergens. It can make your eye red or pink, and it can make your eye feel itchy. This condition cannot be spread from one person to another person. Avoid things that you are allergic to. Take or apply over-the-counter and prescription medicines only as told by your doctor. These include any eye drops. Contact your doctor if your symptoms get worse or they do not get better with treatment. This information is not intended to replace advice given to you by your health care provider. Make sure you   discuss any questions you have with your health care provider. Document Revised: 03/30/2021 Document Reviewed: 03/30/2021 Elsevier Patient Education  2023 [employer]  

## 2021-10-23 NOTE — Progress Notes (Signed)
[facility] clinic  Provider:  Einar Pheasant- Louretta Parma DNP  Code Status:  Full Code  Goals of Care:     08/27/2021    1:14 PM  Advanced Directives  Does Patient Have a Medical Advance Directive? No  Would patient like information on creating a medical advance directive? No - Patient declined     Chief Complaint  Patient presents with   Acute Visit    Patient is here for eyeylid infection    HPI: Patient is a 36 y.o. female seen today for an acute visit for bilateral eye tenderness, gritty and feeling like "sandpaper under eyelids. No noted mucus on both eyes. She denies nasal drainage nor cough.   Past Medical History:  Diagnosis Date   History of chicken pox    HSV infection    PCOS (polycystic ovarian syndrome)    Velamentous insertion of umbilical cord     Past Surgical History:  Procedure Laterality Date   NO PAST SURGERIES     PERINEAL LACERATION REPAIR N/A 03/01/2020   Procedure: SUTURE REPAIR PERINEAL LACERATION;  Surgeon: Louretta Shorten, MD;  Location: [facility];  Service: Gynecology;  Laterality: N/A;    Allergies  Allergen Reactions   Banana Nausea And Vomiting    Outpatient Encounter Medications as of 10/23/2021  Medication Sig   Cholecalciferol 125 MCG (5000 UT) TABS Take 1 tablet by mouth daily at 6 (six) AM.   lidocaine 4 % Place 1 patch onto the skin daily.   MAGNESIUM GLYCINATE PO Take by mouth.   methocarbamol (ROBAXIN) 500 MG tablet Take 1 tablet (500 mg total) by mouth every 8 (eight) hours as needed for muscle spasms.   Misc Natural Products (ADRENAL PO) Take by mouth.   NON FORMULARY SELENIUM 200MCG   Omega-3 Fatty Acids (FISH OIL) 1000 MG CAPS Take 1 each by mouth.   Prenatal MV-Min-Fe Fum-FA-DHA (PRENATAL/FOLIC ACID+DHA PO) Take 1 tablet by mouth daily at 6 (six) AM.   Probiotic Product (PROBIOTIC DAILY PO) Take 1 capsule by mouth daily at 6 (six) AM.   No facility-administered encounter medications on file as of 10/23/2021.    Review of Systems:   Review of Systems  Constitutional:  Negative for appetite change, chills, fatigue and fever.  HENT:  Negative for congestion, hearing loss, rhinorrhea and sore throat.   Eyes: Negative.  Negative for pain, discharge, itching and visual disturbance.  Respiratory:  Negative for cough, shortness of breath and wheezing.   Cardiovascular:  Negative for chest pain, palpitations and leg swelling.  Gastrointestinal:  Negative for abdominal pain, constipation, diarrhea, nausea and vomiting.  Genitourinary:  Negative for dysuria.  Musculoskeletal:  Negative for arthralgias, back pain and myalgias.  Skin:  Negative for color change, rash and wound.  Neurological:  Negative for dizziness, weakness and headaches.  Psychiatric/Behavioral:  Negative for behavioral problems. The patient is not nervous/anxious.     Health Maintenance  Topic Date Due   INFLUENZA VACCINE  09/04/2021   PAP SMEAR-Modifier  02/18/2022   TETANUS/TDAP  12/05/2029   COVID-19 Vaccine  Completed   Hepatitis C Screening  Completed   HIV Screening  Completed   HPV VACCINES  Aged Out    Physical Exam: Vitals:   10/23/21 1542  BP: 118/74  Pulse: 72  Resp: 18  Temp: 97.8 F (36.6 C)  SpO2: 98%  Weight: 187 lb (84.8 kg)  Height: '5\' 3"'$  (1.6 m)   Body mass index is 33.13 kg/m. Physical Exam Constitutional:  General: She is not in acute distress.    Appearance: She is obese.  HENT:     Head: Normocephalic and atraumatic.     Nose: Nose normal.     Mouth/Throat:     Mouth: Mucous membranes are moist.  Eyes:     Conjunctiva/sclera: Conjunctivae normal.     Comments: Minimal red streaks  on eyes  Cardiovascular:     Rate and Rhythm: Normal rate and regular rhythm.  Pulmonary:     Effort: Pulmonary effort is normal.     Breath sounds: Normal breath sounds.  Abdominal:     General: Bowel sounds are normal.     Palpations: Abdomen is soft.  Musculoskeletal:        General: Normal range of motion.      Cervical back: Normal range of motion.  Skin:    General: Skin is warm and dry.  Neurological:     General: No focal deficit present.     Mental Status: She is alert and oriented to person, place, and time.  Psychiatric:        Mood and Affect: Mood normal.        Behavior: Behavior normal.        Thought Content: Thought content normal.        Judgment: Judgment normal.     Labs reviewed: Basic Metabolic Panel: Recent Labs    05/21/21 0857  TSH 2.47   Liver Function Tests: No results for input(s): "AST", "ALT", "ALKPHOS", "BILITOT", "PROT", "ALBUMIN" in the last 8760 hours. No results for input(s): "LIPASE", "AMYLASE" in the last 8760 hours. No results for input(s): "AMMONIA" in the last 8760 hours. CBC: No results for input(s): "WBC", "NEUTROABS", "HGB", "HCT", "MCV", "PLT" in the last 8760 hours. Lipid Panel: No results for input(s): "CHOL", "HDL", "LDLCALC", "TRIG", "CHOLHDL", "LDLDIRECT" in the last 8760 hours. Lab Results  Component Value Date   HGBA1C 5.2 02/04/2012    Procedures since last visit: No results found.  Assessment/Plan  1. Allergic conjunctivitis of both eyes -  keeps eyes clean -  may use cold wet wash cloths to soothe eyes -  do not rub eyes - olopatadine (PATANOL) 0.1 % ophthalmic solution; Place 1 drop into both eyes 2 (two) times daily.  Dispense: 3 mL; Refill: 0    Labs/tests ordered:   None  Next appt:  Visit date not found

## 2022-02-07 ENCOUNTER — Ambulatory Visit (INDEPENDENT_AMBULATORY_CARE_PROVIDER_SITE_OTHER): Payer: No Typology Code available for payment source | Admitting: Family Medicine

## 2022-02-07 ENCOUNTER — Encounter (INDEPENDENT_AMBULATORY_CARE_PROVIDER_SITE_OTHER): Payer: Self-pay | Admitting: Family Medicine

## 2022-02-07 VITALS — BP 107/70 | HR 80 | Temp 98.1°F | Ht 63.0 in | Wt 186.0 lb

## 2022-02-07 DIAGNOSIS — E282 Polycystic ovarian syndrome: Secondary | ICD-10-CM | POA: Diagnosis not present

## 2022-02-07 DIAGNOSIS — E669 Obesity, unspecified: Secondary | ICD-10-CM | POA: Diagnosis not present

## 2022-02-07 DIAGNOSIS — Z0289 Encounter for other administrative examinations: Secondary | ICD-10-CM

## 2022-02-07 DIAGNOSIS — Z6832 Body mass index (BMI) 32.0-32.9, adult: Secondary | ICD-10-CM | POA: Diagnosis not present

## 2022-02-07 DIAGNOSIS — E6609 Other obesity due to excess calories: Secondary | ICD-10-CM

## 2022-02-19 NOTE — Progress Notes (Signed)
Office: [phone]  /  Fax: [phone]   Initial Visit  [name] was seen in clinic today to evaluate for obesity. She is interested in losing weight to improve overall health and reduce the risk of weight related complications. She presents today to review program treatment options, initial physical assessment, and evaluation.     She was referred by: PCP  When asked what else they would like to accomplish? She states: Adopt healthier eating patterns and Improve existing medical conditions  When asked how has your weight affected you? She states: Contributed to medical problems and Having fatigue  Some associated conditions: PCOS  Contributing factors: Stress and Pregnancy  Current nutrition plan: Other: Gluten free and dairy free  Current level of physical activity: Other: Works as an Warden/ranger   Past medical history includes:   Past Medical History:  Diagnosis Date   History of chicken pox    HSV infection    PCOS (polycystic ovarian syndrome)    Velamentous insertion of umbilical cord      Objective:   BP 107/70   Pulse 80   Temp 98.1 F (36.7 C)   Ht '5\' 3"'$  (1.6 m)   Wt 186 lb (84.4 kg)   SpO2 93%   BMI 32.95 kg/m  She was weighed on the bioimpedance scale: Body mass index is 32.95 kg/m. Visceral Fat Rating:8, Body Fat%:40.2.  General:  Alert, oriented and cooperative. Patient is in no acute distress.  Respiratory: Normal respiratory effort, no problems with respiration noted  Extremities: Normal range of motion.    Mental Status: Normal mood and affect. Normal behavior. Normal judgment and thought content.   Assessment and Plan:  1. PCOS (polycystic ovarian syndrome) Lerline has a history of PCOS and irregular progesterone levels. She has a 37 year old child and she may want more in the future. She is not on birth control currently, and she is not breast feeding.   Plan: She was counseled on the importance of improving health and  weight for future healthy pregnancies. We will schedule the patient fo an initial work-up.   2. Obesity with current BMI of 32.9 We reviewed weight, biometrics, associated medical conditions and contributing factors with patient. She would benefit from weight loss therapy via a modified calorie, low-carb, high-protein nutritional plan tailored to their REE (resting energy expenditure) which will be determined by indirect calorimetry.  We will also assess for cardiometabolic risk and nutritional derangements via fasting serologies at her next appointment.      Obesity Treatment / Action Plan:  Will complete provided nutritional and psychosocial assessment questionnaire before the next appointment. Will be scheduled for indirect calorimetry to determine resting energy expenditure in a fasting state.  This will allow [place] to create a reduced calorie, high-protein meal plan to promote loss of fat mass while preserving muscle mass. Was counseled on nutritional approaches to weight loss and benefits of complex carbs and high quality protein as part of nutritional weight management. Was counseled on pharmacotherapy and role as an adjunct in weight management.   Obesity Education Performed Today:  She was weighed on the bioimpedance scale and results were discussed and documented in the synopsis.  We discussed obesity as a disease and the importance of a more detailed evaluation of all the factors contributing to the disease.  We discussed the importance of long term lifestyle changes which include nutrition, exercise and behavioral modifications as well as the importance of customizing this to her specific health and  social needs.  We discussed the benefits of reaching a healthier weight to alleviate the symptoms of existing conditions and reduce the risks of the biomechanical, metabolic and psychological effects of obesity.  Jeanise Durfey appears to be in the action stage of change and states they  are ready to start intensive lifestyle modifications and behavioral modifications.  35 minutes was spent today on this visit including the above counseling, pre-visit chart review, and post-visit documentation.  Reviewed by clinician on day of visit: allergies, medications, problem list, medical history, surgical history, family history, social history, and previous encounter notes.  I have personally spent 45 minutes total time today in preparation, patient care, and documentation for this visit, including the following: review of clinical lab tests; review of medical tests/procedures/services.  I, Trixie Dredge, am acting as transcriptionist for Dennard Nip, MD   I have reviewed the above documentation for accuracy and completeness, and I agree with the above. Dennard Nip, MD

## 2022-03-06 ENCOUNTER — Ambulatory Visit (INDEPENDENT_AMBULATORY_CARE_PROVIDER_SITE_OTHER): Payer: No Typology Code available for payment source | Admitting: Family Medicine

## 2022-03-06 ENCOUNTER — Encounter (INDEPENDENT_AMBULATORY_CARE_PROVIDER_SITE_OTHER): Payer: Self-pay | Admitting: Family Medicine

## 2022-03-06 VITALS — BP 106/73 | HR 86 | Temp 98.7°F | Ht 63.0 in | Wt 184.0 lb

## 2022-03-06 DIAGNOSIS — E559 Vitamin D deficiency, unspecified: Secondary | ICD-10-CM | POA: Diagnosis not present

## 2022-03-06 DIAGNOSIS — R0602 Shortness of breath: Secondary | ICD-10-CM | POA: Diagnosis not present

## 2022-03-06 DIAGNOSIS — E282 Polycystic ovarian syndrome: Secondary | ICD-10-CM

## 2022-03-06 DIAGNOSIS — E063 Autoimmune thyroiditis: Secondary | ICD-10-CM

## 2022-03-06 DIAGNOSIS — Z6832 Body mass index (BMI) 32.0-32.9, adult: Secondary | ICD-10-CM

## 2022-03-06 DIAGNOSIS — R5383 Other fatigue: Secondary | ICD-10-CM | POA: Diagnosis not present

## 2022-03-06 DIAGNOSIS — E669 Obesity, unspecified: Secondary | ICD-10-CM

## 2022-03-06 DIAGNOSIS — M545 Low back pain, unspecified: Secondary | ICD-10-CM

## 2022-03-09 LAB — COMPREHENSIVE METABOLIC PANEL
ALT: 15 IU/L (ref 0–32)
AST: 20 IU/L (ref 0–40)
Albumin/Globulin Ratio: 1.7 (ref 1.2–2.2)
Albumin: 4.8 g/dL (ref 3.9–4.9)
Alkaline Phosphatase: 56 IU/L (ref 44–121)
BUN/Creatinine Ratio: 13 (ref 9–23)
BUN: 9 mg/dL (ref 6–20)
Bilirubin Total: 0.4 mg/dL (ref 0.0–1.2)
CO2: 18 mmol/L — ABNORMAL LOW (ref 20–29)
Calcium: 9.5 mg/dL (ref 8.7–10.2)
Chloride: 102 mmol/L (ref 96–106)
Creatinine, Ser: 0.7 mg/dL (ref 0.57–1.00)
Globulin, Total: 2.9 g/dL (ref 1.5–4.5)
Glucose: 74 mg/dL (ref 70–99)
Potassium: 4.1 mmol/L (ref 3.5–5.2)
Sodium: 141 mmol/L (ref 134–144)
Total Protein: 7.7 g/dL (ref 6.0–8.5)
eGFR: 115 mL/min/{1.73_m2} (ref >59)

## 2022-03-09 LAB — INSULIN, RANDOM: INSULIN: 4.5 u[IU]/mL (ref 2.6–24.9)

## 2022-03-09 LAB — LIPID PANEL
Chol/HDL Ratio: 4.6 ratio — ABNORMAL HIGH (ref 0.0–4.4)
Cholesterol, Total: 258 mg/dL — ABNORMAL HIGH (ref 100–199)
HDL: 56 mg/dL (ref >39)
LDL Chol Calc (NIH): 188 mg/dL — ABNORMAL HIGH (ref 0–99)
Triglycerides: 84 mg/dL (ref 0–149)
VLDL Cholesterol Cal: 14 mg/dL (ref 5–40)

## 2022-03-09 LAB — CBC
Hematocrit: 45 % (ref 34.0–46.6)
Hemoglobin: 15.1 g/dL (ref 11.1–15.9)
MCH: 31 pg (ref 26.6–33.0)
MCHC: 33.6 g/dL (ref 31.5–35.7)
MCV: 92 fL (ref 79–97)
Platelets: 245 10*3/uL (ref 150–450)
RBC: 4.87 x10E6/uL (ref 3.77–5.28)
RDW: 11.6 % — ABNORMAL LOW (ref 11.7–15.4)
WBC: 6.7 10*3/uL (ref 3.4–10.8)

## 2022-03-09 LAB — HEMOGLOBIN A1C
Est. average glucose Bld gHb Est-mCnc: 103 mg/dL
Hgb A1c MFr Bld: 5.2 % (ref 4.8–5.6)

## 2022-03-09 LAB — VITAMIN D 25 HYDROXY (VIT D DEFICIENCY, FRACTURES): Vit D, 25-Hydroxy: 44.3 ng/mL (ref 30.0–100.0)

## 2022-03-09 LAB — TSH RFX ON ABNORMAL TO FREE T4: TSH: 3.07 u[IU]/mL (ref 0.450–4.500)

## 2022-03-09 LAB — VITAMIN B12: Vitamin B-12: 1000 pg/mL (ref 232–1245)

## 2022-03-09 LAB — FOLATE: Folate: 15.9 ng/mL (ref >3.0)

## 2022-03-20 ENCOUNTER — Ambulatory Visit (INDEPENDENT_AMBULATORY_CARE_PROVIDER_SITE_OTHER): Payer: No Typology Code available for payment source | Admitting: Family Medicine

## 2022-03-20 ENCOUNTER — Encounter (INDEPENDENT_AMBULATORY_CARE_PROVIDER_SITE_OTHER): Payer: Self-pay | Admitting: Family Medicine

## 2022-03-20 VITALS — BP 118/66 | HR 69 | Temp 98.0°F | Ht 63.0 in | Wt 183.0 lb

## 2022-03-20 DIAGNOSIS — E7800 Pure hypercholesterolemia, unspecified: Secondary | ICD-10-CM | POA: Diagnosis not present

## 2022-03-20 DIAGNOSIS — E559 Vitamin D deficiency, unspecified: Secondary | ICD-10-CM

## 2022-03-20 DIAGNOSIS — E669 Obesity, unspecified: Secondary | ICD-10-CM

## 2022-03-20 DIAGNOSIS — Z6832 Body mass index (BMI) 32.0-32.9, adult: Secondary | ICD-10-CM

## 2022-03-20 NOTE — Assessment & Plan Note (Signed)
Improving with prescribed meal plan.  Feeling adequate satiety.  Encouraged allowing a high protein/high fiber snack between lunch and dinner when she feels most hungry.  Will be working to increase sleep time to 7 to 8 hours which can help with satiety.  She may add in a total of 2 fruit servings per day and 1 high-fiber starch serving approximately one quarter plate size with dinner since her fasting insulin and A1c were within normal limits.

## 2022-03-20 NOTE — Assessment & Plan Note (Signed)
Reviewed most recent vitamin D level.  She is currently taking vitamin D3 over-the-counter 5000 IU most days of the week.  She does complain of fatigue but is getting about 6 hours of sleep at night.  Encourage compliance with taking over-the-counter vitamin D3 5000 IU once daily.  Plan to recheck in 4 months.  Last vitamin D Lab Results  Component Value Date   VD25OH 44.3 03/06/2022

## 2022-03-20 NOTE — Assessment & Plan Note (Signed)
Reviewed lipid panel together.  She has never required lipid-lowering medication.  She is unsure about a future pregnancy.  She has been hesitant to starting a statin in the past.  Begin reducing intake of high saturated and trans fat foods.  Plan to recheck in 6 months.  Consider use of an NMR LipoProfile.  Lab Results  Component Value Date   CHOL 258 (H) 03/06/2022   HDL 56 03/06/2022   LDLCALC 188 (H) 03/06/2022   TRIG 84 03/06/2022   CHOLHDL 4.6 (H) 03/06/2022

## 2022-03-20 NOTE — Progress Notes (Signed)
Office: [phone]  /  Fax: [facility] Height: 5' 3"$  (1.6 m) Weight: 183 lb (83 kg) Temp: 98 F (36.7 C) Pulse Rate: 69 BP: 118/66 SpO2: 93 % Fasting: No Labs: No Today's Visit #: 2 Weight at Last VIsit: 184 lb Weight Lost Since Last Visit: 1lb  Body Fat %: 39.1 % Fat Mass (lbs): 71.6 lbs Muscle Mass (lbs): 105.8 lbs Total Body Water (lbs): 74 lbs Visceral Fat Rating : 8 Starting Date: 03/06/22 Starting Weight: 184lb Total Weight Loss (lbs): 1 lb (0.454 kg)    HPI  Chief Complaint: OBESITY  [name] is here to discuss her progress with her obesity treatment plan. She is on the the Category 2 Plan and states she is following her eating plan approximately 75 % of the time. She states she played pickle ball and walked 60 minutes 2 times per week.   Interval History:  Since last office visit she is down 1 lb since her last visit.  She is working to get to bed on time and meal plan.  Bringing food to work.  Struggles to get in adequate water.  Choosing gluten free swap outs.  Sticks to gluten free diet due to Hashimoto's.  Eating more veggies. Getting hungrier between lunch and dinner.  Pharmacotherapy: none  PHYSICAL EXAM:  Blood pressure 118/66, pulse 69, temperature 98 F (36.7 C), height 5' 3"$  (1.6 m), weight 183 lb (83 kg), SpO2 93 %, unknown if currently breastfeeding. Body mass index is 32.42 kg/m.  General: She is overweight, cooperative, alert, well developed, and in no acute distress. PSYCH: Has normal mood, affect and thought process.   HEENT: EOMI, sclerae are anicteric. Lungs: Normal breathing effort, no conversational dyspnea. Extremities: No edema.  Neurologic: No gross sensory or motor deficits. No tremors or fasciculations noted.    DIAGNOSTIC DATA REVIEWED:  BMET    Component Value Date/Time   NA 141 03/06/2022 1347   K 4.1 03/06/2022 1347   CL 102 03/06/2022 1347   CO2 18 (L)  03/06/2022 1347   GLUCOSE 74 03/06/2022 1347   GLUCOSE 84 01/13/2012 1340   BUN 9 03/06/2022 1347   CREATININE 0.70 03/06/2022 1347   CALCIUM 9.5 03/06/2022 1347   Lab Results  Component Value Date   HGBA1C 5.2 03/06/2022   HGBA1C 5.2 02/04/2012   Lab Results  Component Value Date   INSULIN 4.5 03/06/2022   Lab Results  Component Value Date   TSH 3.070 03/06/2022   CBC    Component Value Date/Time   WBC 6.7 03/06/2022 1347   WBC 21.3 (H) 03/02/2020 0630   RBC 4.87 03/06/2022 1347   RBC 3.09 (L) 03/02/2020 0630   HGB 15.1 03/06/2022 1347   HCT 45.0 03/06/2022 1347   PLT 245 03/06/2022 1347   MCV 92 03/06/2022 1347   MCH 31.0 03/06/2022 1347   MCH 31.7 03/02/2020 0630   MCHC 33.6 03/06/2022 1347   MCHC 33.2 03/02/2020 0630   RDW 11.6 (L) 03/06/2022 1347   Iron Studies No results found for: "IRON", "TIBC", "FERRITIN", "IRONPCTSAT" Lipid Panel     Component Value Date/Time   CHOL 258 (H) 03/06/2022 1347   TRIG 84 03/06/2022 1347   HDL 56 03/06/2022 1347   CHOLHDL 4.6 (H) 03/06/2022 1347   CHOLHDL 3 01/13/2012 1340   VLDL 5.8 01/13/2012 1340   LDLCALC 188 (H) 03/06/2022 1347   Hepatic Function Panel     Component Value Date/Time  PROT 7.7 03/06/2022 1347   ALBUMIN 4.8 03/06/2022 1347   AST 20 03/06/2022 1347   ALT 15 03/06/2022 1347   ALKPHOS 56 03/06/2022 1347   BILITOT 0.4 03/06/2022 1347      Component Value Date/Time   TSH 3.070 03/06/2022 1347   TSH 2.47 05/21/2021 0857   Nutritional Lab Results  Component Value Date   VD25OH 44.3 03/06/2022     ASSESSMENT AND PLAN  TREATMENT PLAN FOR OBESITY:  Recommended Dietary Goals  [name] is currently in the action stage of change. As such, her goal is to continue weight management plan. She has agreed to the Category 2 Plan.  Behavioral Intervention  We discussed the following Behavioral Modification Strategies today: increasing lean protein intake, increasing vegetables, increasing fiber rich  foods, increase water intake, work on meal planning and easy cooking plans, and think about ways to increase physical activity.  Additional resources provided today: NA  Recommended Physical Activity Goals  Mackinley has been advised to work up to 150 minutes of moderate intensity aerobic activity a week and strengthening exercises 2-3 times per week for cardiovascular health, weight loss maintenance and preservation of muscle mass.   She has agreed to increase physical activity in their day and reduce sedentary time (increase NEAT).  and Will begin regular aerobic exercise 30 minutes, 3 times per week. Chosen activity pickleball or walking.   Pharmacotherapy We discussed various medication options to help [facility] with her weight loss efforts and we both agreed to none.  ASSOCIATED CONDITIONS ADDRESSED TODAY  Pure hypercholesterolemia Assessment & Plan: Reviewed lipid panel together.  She has never required lipid-lowering medication.  She is unsure about a future pregnancy.  She has been hesitant to starting a statin in the past.  Begin reducing intake of high saturated and trans fat foods.  Plan to recheck in 6 months.  Consider use of an NMR LipoProfile.  Lab Results  Component Value Date   CHOL 258 (H) 03/06/2022   HDL 56 03/06/2022   LDLCALC 188 (H) 03/06/2022   TRIG 84 03/06/2022   CHOLHDL 4.6 (H) 03/06/2022      Generalized obesity Assessment & Plan: Improving with prescribed meal plan.  Feeling adequate satiety.  Encouraged allowing a high protein/high fiber snack between lunch and dinner when she feels most hungry.  Will be working to increase sleep time to 7 to 8 hours which can help with satiety.  She may add in a total of 2 fruit servings per day and 1 high-fiber starch serving approximately one quarter plate size with dinner since her fasting insulin and A1c were within normal limits.    BMI 32.0-32.9,adult  Vitamin D deficiency Assessment & Plan: Reviewed most  recent vitamin D level.  She is currently taking vitamin D3 over-the-counter 5000 IU most days of the week.  She does complain of fatigue but is getting about 6 hours of sleep at night.  Encourage compliance with taking over-the-counter vitamin D3 5000 IU once daily.  Plan to recheck in 4 months.  Last vitamin D Lab Results  Component Value Date   VD25OH 44.3 03/06/2022          No follow-ups on file.Marland Kitchen She was informed of the importance of frequent follow up visits to maximize her success with intensive lifestyle modifications for her multiple health conditions.   ATTESTASTION STATEMENTS:  Reviewed by clinician on day of visit: allergies, medications, problem list, medical history, surgical history, family history, social history, and previous encounter notes.  Time spent on visit including pre-visit chart review and post-visit care and charting was 30 minutes.    Dell Ponto, DO

## 2022-03-28 NOTE — Progress Notes (Signed)
Chief Complaint:   OBESITY [name] (MR# [id number]) is a 37 y.o. female who presents for evaluation and treatment of obesity and related comorbidities. Current BMI is Body mass index is 32.59 kg/m. [name] has been struggling with her weight for many years and has been unsuccessful in either losing weight, maintaining weight loss, or reaching her healthy weight goal.  [name] is currently in the action stage of change and ready to dedicate time achieving and maintaining a healthier weight. [name] is interested in becoming our patient and working on intensive lifestyle modifications including (but not limited to) diet and exercise for weight loss.  [name] gained weight post-partum. She has failed to see enough weight loss with dieting. She was 168 lbs pre-baby.  Weight between 18 and early 20's: 135-150's but had to exercise a lot and eat very little. Pt has family history of obesity. She has reduced snacks. Working on meal prep. She lives with her spouse and 28 year old son. Pt denies sugar cravings.  [name]'s habits were reviewed today and are as follows: she thinks her family will eat healthier with her, her desired weight loss is 19 lbs, she has been heavy most of her life, she started gaining weight in 2021, her heaviest weight ever was 196 pounds, she has significant food cravings issues, she snacks frequently in the evenings, she skips meals frequently, she is frequently drinking liquids with calories, and she struggles with emotional eating.  Depression Screen [name]'s Food and Mood (modified PHQ-9) score was 16.     03/06/2022    7:39 AM  Depression screen PHQ 2/9  Decreased Interest 3  Down, Depressed, Hopeless 3  PHQ - 2 Score 6  Altered sleeping 2  Tired, decreased energy 2  Change in appetite 3  Feeling bad or failure about yourself  1  Trouble concentrating 2  Moving slowly or fidgety/restless 0  Suicidal thoughts 0  PHQ-9 Score 16  Difficult doing  work/chores Somewhat difficult   Subjective:   1. Other fatigue [name] admits to daytime somnolence and admits to waking up still tired. Patient has a history of symptoms of daytime fatigue and morning fatigue. [name] generally gets  5-7  hours of sleep per night, and states that she has poor sleep quality. Snoring is present. Apneic episodes are not present. Epworth Sleepiness Score is 4.  EKG reviewed from 08/30/2021  2. SOBOE (shortness of breath on exertion) [name] notes increasing shortness of breath with exercising and seems to be worsening over time with weight gain. She notes getting out of breath sooner with activity than she used to. This has gotten worse recently. [name] denies shortness of breath at rest or orthopnea. Expected BMR 1553 Actual BMR 1498= Less than expected.  3. PCOS (polycystic ovarian syndrome) Regular menses without birth control, moderately heavy. Pt has never been on Metformin.  4. Low back pain, unspecified back pain laterality, unspecified chronicity, unspecified whether sciatica present Symptoms for 6 years Pt sees a chiropractor and massage therapist. She has added in some yoga stretches and low weight resistance.  5. Vitamin D deficiency [name] is taking OTC Vitamin D 5,000 IU daily. Energy level fair  6. Hashimoto's disease Seeing Functional Medicine. She is not on any medication. Takes selenium  Assessment/Plan:   1. Other fatigue [name] does feel that her weight is causing her energy to be lower than it should be. Fatigue may be related to obesity, depression or many other causes. Labs will be  ordered, and in the meanwhile, [name] will focus on self care including making healthy food choices, increasing physical activity and focusing on stress reduction.  Lab/Orders today: - VITAMIN D 25 Hydroxy (Vit-D Deficiency, Fractures) - Lipid panel - Comprehensive metabolic panel - Vitamin 123456 - CBC - TSH Rfx on Abnormal to Free T4 - Hemoglobin  A1c - Insulin, random - Folate  2. SOBOE (shortness of breath on exertion) [name] does feel that she gets out of breath more easily that she used to when she exercises. [name]'s shortness of breath appears to be obesity related and exercise induced. She has agreed to work on weight loss and gradually increase exercise to treat her exercise induced shortness of breath. Will continue to monitor closely.  3. PCOS (polycystic ovarian syndrome) Check A1c and fasting insulin today. Consider use of Metformin.  4. Low back pain, unspecified back pain laterality, unspecified chronicity, unspecified whether sciatica present Plans to start swimming at the [facility].  5. Vitamin D deficiency Check Vitamin D level today.  6. Hashimoto's disease Check TSH today.  7. Generalized obesity 8. BMI 32.0-32.9,adult Adjusted meal plan to accommodate dairy free, gluten free diet.  [name] is currently in the action stage of change and her goal is to continue with weight loss efforts. I recommend [name] begin the structured treatment plan as follows:  She has agreed to the Category 2 Plan with 85+ grams protein.  Exercise goals:  Track steps daily    Behavioral modification strategies: increasing lean protein intake, decreasing simple carbohydrates, increasing vegetables, increasing water intake, increasing high fiber foods, meal planning and cooking strategies, keeping healthy foods in the home, better snacking choices, and planning for success.  She was informed of the importance of frequent follow-up visits to maximize her success with intensive lifestyle modifications for her multiple health conditions. She was informed we would discuss her lab results at her next visit unless there is a critical issue that needs to be addressed sooner. [name] agreed to keep her next visit at the agreed upon time to discuss these results.  Objective:   Blood pressure 106/73, pulse 86, temperature 98.7 F (37.1 C), height  '5\' 3"'$  (1.6 m), weight 184 lb (83.5 kg), SpO2 97 %, unknown if currently breastfeeding. Body mass index is 32.59 kg/m.  EKG: Normal sinus rhythm, rate 71.  Indirect Calorimeter completed today shows a VO2 of 217 and a REE of 1498.  Her calculated basal metabolic rate is 123456 thus her basal metabolic rate is worse than expected.  General: Cooperative, alert, well developed, in no acute distress. HEENT: Conjunctivae and lids unremarkable. Cardiovascular: Regular rhythm.  Lungs: Normal work of breathing. Neurologic: No focal deficits.   Lab Results  Component Value Date   CREATININE 0.70 03/06/2022   BUN 9 03/06/2022   NA 141 03/06/2022   K 4.1 03/06/2022   CL 102 03/06/2022   CO2 18 (L) 03/06/2022   Lab Results  Component Value Date   ALT 15 03/06/2022   AST 20 03/06/2022   ALKPHOS 56 03/06/2022   BILITOT 0.4 03/06/2022   Lab Results  Component Value Date   HGBA1C 5.2 03/06/2022   HGBA1C 5.2 02/04/2012   Lab Results  Component Value Date   INSULIN 4.5 03/06/2022   Lab Results  Component Value Date   TSH 3.070 03/06/2022   Lab Results  Component Value Date   CHOL 258 (H) 03/06/2022   HDL 56 03/06/2022   LDLCALC 188 (H) 03/06/2022   TRIG 84 03/06/2022  CHOLHDL 4.6 (H) 03/06/2022   Lab Results  Component Value Date   WBC 6.7 03/06/2022   HGB 15.1 03/06/2022   HCT 45.0 03/06/2022   MCV 92 03/06/2022   PLT 245 03/06/2022   Attestation Statements:   Reviewed by clinician on day of visit: allergies, medications, problem list, medical history, surgical history, family history, social history, and previous encounter notes.  Time spent on visit including pre-visit chart review and post-visit charting and care was 40 minutes.   I, Kathlene November, BS, CMA, am acting as transcriptionist for Loyal Gambler, DO.   I have reviewed the above documentation for accuracy and completeness, and I agree with the above. Dell Ponto, DO

## 2022-04-18 ENCOUNTER — Encounter (INDEPENDENT_AMBULATORY_CARE_PROVIDER_SITE_OTHER): Payer: Self-pay | Admitting: Family Medicine

## 2022-04-18 ENCOUNTER — Ambulatory Visit (INDEPENDENT_AMBULATORY_CARE_PROVIDER_SITE_OTHER): Payer: No Typology Code available for payment source | Admitting: Family Medicine

## 2022-04-18 VITALS — BP 104/71 | HR 72 | Temp 98.3°F | Ht 63.0 in | Wt 182.0 lb

## 2022-04-18 DIAGNOSIS — Z6832 Body mass index (BMI) 32.0-32.9, adult: Secondary | ICD-10-CM

## 2022-04-18 DIAGNOSIS — E559 Vitamin D deficiency, unspecified: Secondary | ICD-10-CM

## 2022-04-18 DIAGNOSIS — E669 Obesity, unspecified: Secondary | ICD-10-CM

## 2022-04-18 DIAGNOSIS — E7800 Pure hypercholesterolemia, unspecified: Secondary | ICD-10-CM

## 2022-04-18 NOTE — Assessment & Plan Note (Signed)
Last vitamin D Lab Results  Component Value Date   VD25OH 44.3 03/06/2022   Taking OTC vitamin D 5,000 IU daily Energy level is stable  Recheck labs in May

## 2022-04-18 NOTE — Assessment & Plan Note (Signed)
Lab Results  Component Value Date   CHOL 258 (H) 03/06/2022   HDL 56 03/06/2022   LDLCALC 188 (H) 03/06/2022   TRIG 84 03/06/2022   CHOLHDL 4.6 (H) 03/06/2022   Working on a low saturated fat/ low transfat diet and medical weight loss.  Plan to recheck labs in May.

## 2022-04-18 NOTE — Progress Notes (Signed)
Office: [phone]  /  Fax: [place]  Starting Date: 03/06/22  Starting Weight: 184lb   Weight Lost Since Last Visit: 1lb   Vitals Temp: 98.3 F (36.8 C) BP: 104/71 Pulse Rate: 72 SpO2: 96 %   Body Composition  Body Fat %: 40.2 % Fat Mass (lbs): 73.2 lbs Muscle Mass (lbs): 103.4 lbs Total Body Water (lbs): 75 lbs Visceral Fat Rating : 8   HPI  Chief Complaint: OBESITY  [name] is here to discuss her progress with her obesity treatment plan. [name] is on the the Category 2 Plan and states [name] is following her eating plan approximately 70 % of the time. [name] states [name] is exercising 30-60 minutes 3 times per week.   Interval History:  Since last office visit [name] is down 1 lb Down 2.4 lb of muscle mass, up 2.6 lb of body fat in 4 weeks This gives her a net weight loss of 2 lb in 6 weeks [name] has felt hungrier in the afternoon.  Denies meals skipping Started walking outside 30 min during the work week and longer on the weekends Getting to the gym 1 x or less per week  Breakfast is usually overnight oats: 1/3 cup plain oats, collagen powder, oatmilk, blueberries, chia seeds, cinammon, vanilla extract- fills her until lunch  Lunch is usually: ham slices, tomato, pickle, [employer] crackers Snacks in the afternoon : Chomps meat stick + [employer] non dairy yogurt Dinner: roasted chicken breast, air fried zucchini, carrots, cabbage (felt full)  4 Siette cookies, sometimes dairy free hot cocoa  Pharmacotherapy: none  PHYSICAL EXAM:  Blood pressure 104/71, pulse 72, temperature 98.3 F (36.8 C), height '5\' 3"'$  (1.6 m), weight 182 lb (82.6 kg), SpO2 96 %, unknown if currently breastfeeding. Body mass index is 32.24 kg/m.  General: [name] is overweight, cooperative, alert, well developed, and in no acute distress. PSYCH: Has normal mood, affect and thought process.   Lungs: Normal breathing effort, no conversational dyspnea.   ASSESSMENT  AND PLAN  TREATMENT PLAN FOR OBESITY:  Recommended Dietary Goals  [name] is currently in the action stage of change. As such, her goal is to continue weight management plan. [name] has agreed to keeping a food journal and adhering to recommended goals of 1400 calories and 90 g of  protein.  Behavioral Intervention  We discussed the following Behavioral Modification Strategies today: increasing lean protein intake, increasing vegetables, increasing fiber rich foods, increasing water intake, work on meal planning and easy cooking plans, and work on tracking and journaling calories using tracking App.  Additional resources provided today: NA  Recommended Physical Activity Goals  [name] has been advised to work up to 150 minutes of moderate intensity aerobic activity a week and strengthening exercises 2-3 times per week for cardiovascular health, weight loss maintenance and preservation of muscle mass.   [name] has agreed to Work on scheduling and tracking physical activity.   Pharmacotherapy changes for the treatment of obesity:   ASSOCIATED CONDITIONS ADDRESSED TODAY  Vitamin D deficiency Assessment & Plan: Last vitamin D Lab Results  Component Value Date   VD25OH 44.3 03/06/2022   Taking OTC vitamin D 5,000 IU daily Energy level is stable  Recheck labs in May   Generalized obesity Assessment & Plan: Net weight loss 2 lb in 6 wks  Recommend tracking daily caloric intake on the MyFitnessPal ap Aim for 1400 cal/ day (increased) which should include at least 90 g of protein daily  Track daily steps with a goal of 10,000 every day Reviewed adding in additional sources of lean protein and fiber.   BMI 32.0-32.9,adult  Pure hypercholesterolemia Assessment & Plan: Lab Results  Component Value Date   CHOL 258 (H) 03/06/2022   HDL 56 03/06/2022   LDLCALC 188 (H) 03/06/2022   TRIG 84 03/06/2022   CHOLHDL 4.6 (H) 03/06/2022   Working on a low saturated fat/ low transfat diet  and medical weight loss.  Plan to recheck labs in May.      [name] was informed of the importance of frequent follow up visits to maximize her success with intensive lifestyle modifications for her multiple health conditions.   ATTESTASTION STATEMENTS:  Reviewed by clinician on day of visit: allergies, medications, problem list, medical history, surgical history, family history, social history, and previous encounter notes pertinent to obesity diagnosis.   I have personally spent 30 minutes total time today in preparation, patient care, nutritional counseling and documentation for this visit, including the following: review of clinical lab tests; review of medical tests/procedures/services.      Dell Ponto, DO DABFM, DABOM [facility] [address] [place], [place] [address] [phone]

## 2022-04-18 NOTE — Assessment & Plan Note (Signed)
Net weight loss 2 lb in 6 wks  Recommend tracking daily caloric intake on the MyFitnessPal ap Aim for 1400 cal/ day (increased) which should include at least 90 g of protein daily  Track daily steps with a goal of 10,000 every day Reviewed adding in additional sources of lean protein and fiber.

## 2022-05-02 ENCOUNTER — Encounter: Payer: Self-pay | Admitting: Orthopedic Surgery

## 2022-05-20 ENCOUNTER — Encounter (INDEPENDENT_AMBULATORY_CARE_PROVIDER_SITE_OTHER): Payer: Self-pay | Admitting: Family Medicine

## 2022-05-20 ENCOUNTER — Ambulatory Visit (INDEPENDENT_AMBULATORY_CARE_PROVIDER_SITE_OTHER): Payer: No Typology Code available for payment source | Admitting: Family Medicine

## 2022-05-20 VITALS — BP 114/80 | HR 76 | Temp 98.3°F | Ht 63.0 in | Wt 183.0 lb

## 2022-05-20 DIAGNOSIS — E669 Obesity, unspecified: Secondary | ICD-10-CM

## 2022-05-20 DIAGNOSIS — E7800 Pure hypercholesterolemia, unspecified: Secondary | ICD-10-CM | POA: Diagnosis not present

## 2022-05-20 DIAGNOSIS — E282 Polycystic ovarian syndrome: Secondary | ICD-10-CM

## 2022-05-20 DIAGNOSIS — Z6832 Body mass index (BMI) 32.0-32.9, adult: Secondary | ICD-10-CM

## 2022-05-20 DIAGNOSIS — E668 Other obesity: Secondary | ICD-10-CM | POA: Diagnosis not present

## 2022-05-20 NOTE — Progress Notes (Signed)
Office: [phone]  /  Fax: [phone]  WEIGHT SUMMARY AND BIOMETRICS  Starting Date: 03/06/22  Starting Weight: 184lb   Weight Lost Since Last Visit: 0   Vitals Temp: 98.3 F (36.8 C) BP: 114/80 Pulse Rate: 76 SpO2: 96 %   Body Composition  Body Fat %: 37.1 % Fat Mass (lbs): 68 lbs Muscle Mass (lbs): 109.4 lbs Total Body Water (lbs): 77.4 lbs Visceral Fat Rating : 7   HPI  Chief Complaint: OBESITY  [name] is here to discuss her progress with her obesity treatment plan. She is on the the Category 2 Plan and states she is following her eating plan approximately 60 % of the time. She states she is exercising 30-60 minutes 2 times per week.   Interval History:  Since last office visit she is up 1 lb She has a net weight loss of 1 lb in 2 mos She has gained 6 lb of lean muscle and lost 5.2 lb of body fat She has been dealing with pelvic prolapse- in pelvic PT She has been more constipated with the addition of protein powder though she was hydrating better [facility] was snacking more when she was constipated and missing her meals   Pharmacotherapy: none  PHYSICAL EXAM:  Blood pressure 114/80, pulse 76, temperature 98.3 F (36.8 C), height 5\' 3"  (1.6 m), weight 183 lb (83 kg), SpO2 96 %, unknown if currently breastfeeding. Body mass index is 32.42 kg/m.  General: She is overweight, cooperative, alert, well developed, and in no acute distress. PSYCH: Has normal mood, affect and thought process.   Lungs: Normal breathing effort, no conversational dyspnea.   ASSESSMENT AND PLAN  TREATMENT PLAN FOR OBESITY:  Recommended Dietary Goals  [name] is currently in the action stage of change. As such, her goal is to continue weight management plan. She has agreed to the Category 2 Plan.  Behavioral Intervention  We discussed the following Behavioral Modification Strategies today: increasing vegetables, increasing fiber rich foods,  increasing water intake, work on meal planning and preparation, work on managing stress, creating time for self-care and relaxation measures, and better snacking choices.  Additional resources provided today: NA  Recommended Physical Activity Goals  [name] has been advised to work up to 150 minutes of moderate intensity aerobic activity a week and strengthening exercises 2-3 times per week for cardiovascular health, weight loss maintenance and preservation of muscle mass.   She has agreed to [employer] strengthening exercises with a goal of 2-3 sessions a week  Keep swimming with a goal of 3 x a week  Pharmacotherapy changes for the treatment of obesity: none  ASSOCIATED CONDITIONS ADDRESSED TODAY  Pure hypercholesterolemia Assessment & Plan: Lab Results  Component Value Date   CHOL 258 (H) 03/06/2022   HDL 56 03/06/2022   LDLCALC 188 (H) 03/06/2022   TRIG 84 03/06/2022   CHOLHDL 4.6 (H) 03/06/2022   Reviewed labs from March 06, 2022.  She is currently not on any lipid-lowering medication and does have a positive family history for high cholesterol.  She has been working on reducing her intake of high saturated fat foods.  Plan: Recheck fasting lipid panel next visit in May.   Generalized obesity Assessment & Plan: Reviewed bioimpedance results with increased muscle mass and a loss of body fat.  Her progress has been sidelined somewhat by pelvic floor prolapse limiting her exercise and constipation causing her to consume more small starchy snacks versus higher protein meals.  She may discontinue her protein powder which was constipating.  She has increased her water intake.  Advised increasing her intake of fiber with fruits and vegetables and trying out some plant-based protein options in her diet like chickpea Pasta, beans, quinoa, edamame.  Will have her start a food journal indicating which foods tend to constipate her more.  Continue pelvic floor PT.  We discussed exercises that  she may continue to do while undergoing pelvic floor PT including swimming, certain resistance training exercises, yoga.   BMI 32.0-32.9,adult  PCOS (polycystic ovarian syndrome) Assessment & Plan: Last fasting insulin low at 4.5 in January. She has been working on reducing her intake of added sugar. She is currently not on metformin  Continue plan of care per OB/GYN, focusing on a low sugar/low refined carbohydrate diet and regular exercise.       She was informed of the importance of frequent follow up visits to maximize her success with intensive lifestyle modifications for her multiple health conditions.   ATTESTASTION STATEMENTS:  Reviewed by clinician on day of visit: allergies, medications, problem list, medical history, surgical history, family history, social history, and previous encounter notes pertinent to obesity diagnosis.   I have personally spent 30 minutes total time today in preparation, patient care, nutritional counseling and documentation for this visit, including the following: review of clinical lab tests; review of medical tests/procedures/services.      Glennis Brink, DO DABFM, DABOM [facility] [address] [place], [place] [address] [phone]

## 2022-05-20 NOTE — Assessment & Plan Note (Signed)
Reviewed bioimpedance results with increased muscle mass and a loss of body fat.  Her progress has been sidelined somewhat by pelvic floor prolapse limiting her exercise and constipation causing her to consume more small starchy snacks versus higher protein meals.  She may discontinue her protein powder which was constipating.  She has increased her water intake.  Advised increasing her intake of fiber with fruits and vegetables and trying out some plant-based protein options in her diet like chickpea Pasta, beans, quinoa, edamame.  Will have her start a food journal indicating which foods tend to constipate her more.  Continue pelvic floor PT.  We discussed exercises that she may continue to do while undergoing pelvic floor PT including swimming, certain resistance training exercises, yoga.

## 2022-05-20 NOTE — Assessment & Plan Note (Signed)
Last fasting insulin low at 4.5 in January. She has been working on reducing her intake of added sugar. She is currently not on metformin  Continue plan of care per OB/GYN, focusing on a low sugar/low refined carbohydrate diet and regular exercise.

## 2022-05-20 NOTE — Assessment & Plan Note (Signed)
Lab Results  Component Value Date   CHOL 258 (H) 03/06/2022   HDL 56 03/06/2022   LDLCALC 188 (H) 03/06/2022   TRIG 84 03/06/2022   CHOLHDL 4.6 (H) 03/06/2022   Reviewed labs from March 06, 2022.  She is currently not on any lipid-lowering medication and does have a positive family history for high cholesterol.  She has been working on reducing her intake of high saturated fat foods.  Plan: Recheck fasting lipid panel next visit in May.

## 2022-06-18 ENCOUNTER — Encounter (INDEPENDENT_AMBULATORY_CARE_PROVIDER_SITE_OTHER): Payer: Self-pay | Admitting: Family Medicine

## 2022-06-18 ENCOUNTER — Ambulatory Visit (INDEPENDENT_AMBULATORY_CARE_PROVIDER_SITE_OTHER): Payer: No Typology Code available for payment source | Admitting: Family Medicine

## 2022-06-18 VITALS — BP 106/71 | HR 74 | Temp 98.0°F | Ht 63.0 in | Wt 183.0 lb

## 2022-06-18 DIAGNOSIS — Z6832 Body mass index (BMI) 32.0-32.9, adult: Secondary | ICD-10-CM

## 2022-06-18 DIAGNOSIS — E282 Polycystic ovarian syndrome: Secondary | ICD-10-CM

## 2022-06-18 DIAGNOSIS — E063 Autoimmune thyroiditis: Secondary | ICD-10-CM

## 2022-06-18 DIAGNOSIS — E559 Vitamin D deficiency, unspecified: Secondary | ICD-10-CM

## 2022-06-18 DIAGNOSIS — E669 Obesity, unspecified: Secondary | ICD-10-CM

## 2022-06-18 DIAGNOSIS — E7800 Pure hypercholesterolemia, unspecified: Secondary | ICD-10-CM

## 2022-06-18 NOTE — Assessment & Plan Note (Signed)
Lab Results  Component Value Date   CHOL 258 (H) 03/06/2022   HDL 56 03/06/2022   LDLCALC 188 (H) 03/06/2022   TRIG 84 03/06/2022   CHOLHDL 4.6 (H) 03/06/2022   She has been adherent to a low saturated fat diet x 3 mos Taking an Omega 3 Fish Oil supplement daily  Repeat FLP today

## 2022-06-18 NOTE — Progress Notes (Signed)
Office: [phone]  /  Fax: [phone]  WEIGHT SUMMARY AND BIOMETRICS  Starting Date: 03/06/22  Starting Weight: 184 lb   Weight Lost Since Last Visit: 0 lb   Vitals Temp: 98 F (36.7 C) BP: 106/71 Pulse Rate: 74 SpO2: 98 %   Body Composition  Body Fat %: 36.3 % Fat Mass (lbs): 70.4 lbs Muscle Mass (lbs): 107.6 lbs Total Body Water (lbs): 73.6 lbs Visceral Fat Rating : 8     HPI  Chief Complaint: OBESITY  [name] is here to discuss her progress with her obesity treatment plan. She is on the the Category 2 Plan and states she is following her eating plan approximately 75 % of the time. She states she is exercising 30 minutes 2-3 times per week.   Interval History:  Since last office visit she is down 0 lb She has been hungrier lately She is doing cardio (swimming and walking) 5 x a week She is getting in more veggies Denies excess sweets She is getting in 80+ oz of water in daily Stress levels are high  For breakfast, she is having [employer] yogurt + protein powder + flaxseed + berries For lunch, she is bring lunch to work - [employer] + chili 90% ground beef She needs a snack around 3 pm- usually another greek yogurt  Pharmacotherapy: none  PHYSICAL EXAM:  Blood pressure 106/71, pulse 74, temperature 98 F (36.7 C), height 5\' 3"  (1.6 m), weight 183 lb (83 kg), last menstrual period 04/29/2022, SpO2 98 %, unknown if currently breastfeeding. Body mass index is 32.42 kg/m.  General: She is overweight, cooperative, alert, well developed, and in no acute distress. PSYCH: Has normal mood, affect and thought process.   Lungs: Normal breathing effort, no conversational dyspnea.   ASSESSMENT AND PLAN  TREATMENT PLAN FOR OBESITY:  Recommended Dietary Goals  [name] is currently in the action stage of change. As such, her goal is to continue weight management plan. She has agreed to keeping a food journal and adhering to recommended goals of 1800  calories and 90 g of  protein.  Behavioral Intervention  We discussed the following Behavioral Modification Strategies today: increasing lean protein intake, decreasing simple carbohydrates , increasing vegetables, increasing lower glycemic fruits, increasing fiber rich foods, avoiding skipping meals, increasing water intake, keeping healthy foods at home, work on managing stress, creating time for self-care and relaxation measures, continue to practice mindfulness when eating, and planning for success.  Additional resources provided today: NA  Recommended Physical Activity Goals  [name] has been advised to work up to 150 minutes of moderate intensity aerobic activity a week and strengthening exercises 2-3 times per week for cardiovascular health, weight loss maintenance and preservation of muscle mass.   She has agreed to Increase the intensity, frequency or duration of aerobic exercises    Pharmacotherapy changes for the treatment of obesity:  none  ASSOCIATED CONDITIONS ADDRESSED TODAY  PCOS (polycystic ovarian syndrome) Assessment & Plan: Never on metformin for PCOS Working on regular exercise (cardio 30+ min 5 x a week) and has reduced intake of added sugars and refined carbs  Repeat labs today Consider metformin use given slow weight reduction  Orders: -     Insulin, random  Hashimoto's disease Assessment & Plan: She has been dealing with mild edema, constipation and fatigue She has been off thyroid medication with a  hx of Hashimoto's  Repeat TSH today  Orders: -     TSH Rfx on Abnormal to Free  T4  Vitamin D deficiency Assessment & Plan: Last vitamin D Lab Results  Component Value Date   VD25OH 44.3 03/06/2022   She is taking a PNV + Vitamin D OTC 2,000 IU daily Energy level is still fairly low   Repeat vitamin D level today with a 50-70 goal  Orders: -     VITAMIN D 25 Hydroxy (Vit-D Deficiency, Fractures)  Pure hypercholesterolemia Assessment &  Plan: Lab Results  Component Value Date   CHOL 258 (H) 03/06/2022   HDL 56 03/06/2022   LDLCALC 188 (H) 03/06/2022   TRIG 84 03/06/2022   CHOLHDL 4.6 (H) 03/06/2022   She has been adherent to a low saturated fat diet x 3 mos Taking an Omega 3 Fish Oil supplement daily  Repeat FLP today  Orders: -     Lipid panel  Generalized obesity with starting BMI 32 Assessment & Plan: Net weight loss is 1 lb in 3 mos but her body fat % has dropped from 40--> 36% We reviewed her positive changes on bioimpedence Hunger has worsened with the addition of cardio exercise on Cat 2 meal plan  Will change her to logging on the MyFitnessPal ap with 1800 cal/ day.  This should include 90 g of protein daily. Recommend an increase in fruits and veggies, 64+ oz of water daily and limiting meals out/ processed foods.  Consider the addition of AOM.   BMI 32.0-32.9,adult      She was informed of the importance of frequent follow up visits to maximize her success with intensive lifestyle modifications for her multiple health conditions.   ATTESTASTION STATEMENTS:  Reviewed by clinician on day of visit: allergies, medications, problem list, medical history, surgical history, family history, social history, and previous encounter notes pertinent to obesity diagnosis.   I have personally spent 30 minutes total time today in preparation, patient care, nutritional counseling and documentation for this visit, including the following: review of clinical lab tests; review of medical tests/procedures/services.      Glennis Brink, DO DABFM, DABOM [facility] [address] [place], [place] [address] [phone]

## 2022-06-18 NOTE — Assessment & Plan Note (Signed)
Last vitamin D Lab Results  Component Value Date   VD25OH 44.3 03/06/2022   She is taking a PNV + Vitamin D OTC 2,000 IU daily Energy level is still fairly low   Repeat vitamin D level today with a 50-70 goal

## 2022-06-18 NOTE — Assessment & Plan Note (Signed)
She has been dealing with mild edema, constipation and fatigue She has been off thyroid medication with a  hx of Hashimoto's  Repeat TSH today

## 2022-06-18 NOTE — Assessment & Plan Note (Signed)
Never on metformin for PCOS Working on regular exercise (cardio 30+ min 5 x a week) and has reduced intake of added sugars and refined carbs  Repeat labs today Consider metformin use given slow weight reduction

## 2022-06-18 NOTE — Assessment & Plan Note (Signed)
Net weight loss is 1 lb in 3 mos but her body fat % has dropped from 40--> 36% We reviewed her positive changes on bioimpedence Hunger has worsened with the addition of cardio exercise on Cat 2 meal plan  Will change her to logging on the MyFitnessPal ap with 1800 cal/ day.  This should include 90 g of protein daily. Recommend an increase in fruits and veggies, 64+ oz of water daily and limiting meals out/ processed foods.  Consider the addition of AOM.

## 2022-06-19 ENCOUNTER — Other Ambulatory Visit: Payer: Self-pay | Admitting: Orthopedic Surgery

## 2022-06-19 DIAGNOSIS — E7800 Pure hypercholesterolemia, unspecified: Secondary | ICD-10-CM

## 2022-06-19 LAB — INSULIN, RANDOM: INSULIN: 6.3 u[IU]/mL (ref 2.6–24.9)

## 2022-06-19 LAB — LIPID PANEL
Chol/HDL Ratio: 4 ratio (ref 0.0–4.4)
Cholesterol, Total: 242 mg/dL — ABNORMAL HIGH (ref 100–199)
HDL: 60 mg/dL (ref >39)
LDL Chol Calc (NIH): 171 mg/dL — ABNORMAL HIGH (ref 0–99)
Triglycerides: 65 mg/dL (ref 0–149)
VLDL Cholesterol Cal: 11 mg/dL (ref 5–40)

## 2022-06-19 LAB — VITAMIN D 25 HYDROXY (VIT D DEFICIENCY, FRACTURES): Vit D, 25-Hydroxy: 50.3 ng/mL (ref 30.0–100.0)

## 2022-06-19 LAB — TSH RFX ON ABNORMAL TO FREE T4: TSH: 2.6 u[IU]/mL (ref 0.450–4.500)

## 2022-06-26 ENCOUNTER — Emergency Department (HOSPITAL_COMMUNITY): Payer: PRIVATE HEALTH INSURANCE

## 2022-06-26 ENCOUNTER — Other Ambulatory Visit: Payer: Self-pay

## 2022-06-26 ENCOUNTER — Emergency Department (HOSPITAL_COMMUNITY)
Admission: EM | Admit: 2022-06-26 | Discharge: 2022-06-26 | Disposition: A | Discharge status: Home / Self Care | Payer: PRIVATE HEALTH INSURANCE | Attending: Emergency Medicine | Admitting: Emergency Medicine

## 2022-06-26 DIAGNOSIS — R079 Chest pain, unspecified: Secondary | ICD-10-CM | POA: Insufficient documentation

## 2022-06-26 LAB — BASIC METABOLIC PANEL
Anion gap: 10 (ref 5–15)
BUN: 14 mg/dL (ref 6–20)
CO2: 24 mmol/L (ref 22–32)
Calcium: 9.1 mg/dL (ref 8.9–10.3)
Chloride: 103 mmol/L (ref 98–111)
Creatinine, Ser: 0.75 mg/dL (ref 0.44–1.00)
GFR, Estimated: >60 mL/min (ref >60)
Glucose, Bld: 108 mg/dL — ABNORMAL HIGH (ref 70–99)
Potassium: 4.2 mmol/L (ref 3.5–5.1)
Sodium: 137 mmol/L (ref 135–145)

## 2022-06-26 LAB — CBC
HCT: 42.4 % (ref 36.0–46.0)
Hemoglobin: 14.5 g/dL (ref 12.0–15.0)
MCH: 31.2 pg (ref 26.0–34.0)
MCHC: 34.2 g/dL (ref 30.0–36.0)
MCV: 91.2 fL (ref 80.0–100.0)
Platelets: 247 10*3/uL (ref 150–400)
RBC: 4.65 MIL/uL (ref 3.87–5.11)
RDW: 11.9 % (ref 11.5–15.5)
WBC: 6.9 10*3/uL (ref 4.0–10.5)
nRBC: 0 % (ref 0.0–0.2)

## 2022-06-26 LAB — TROPONIN I (HIGH SENSITIVITY)
Troponin I (High Sensitivity): 3 ng/L (ref <18)
Troponin I (High Sensitivity): 3 ng/L (ref <18)

## 2022-06-26 LAB — I-STAT BETA HCG BLOOD, ED (MC, WL, AP ONLY): I-stat hCG, quantitative: <5 m[IU]/mL (ref <5)

## 2022-06-26 NOTE — ED Provider Notes (Signed)
  Physical Exam  BP 111/75   Pulse 72   Temp 97.9 F (36.6 C)   Resp 14   LMP 04/29/2022   SpO2 100%   Physical Exam  Procedures  Procedures  ED Course / MDM   Clinical Course as of 06/26/22 0646  Wed Jun 26, 2022  0532 Offered medication to aid with pain at this time, the patient declines at this time. [SB]    Clinical Course User Index [SB] Blue, Soijett A, PA-C   Medical Decision Making Amount and/or Complexity of Data Reviewed Labs: ordered. Radiology: ordered.   Patient care assumed from [facility], PA-C at shift handoff.  Please see her note for full details. In short, 37 year old female patient with past medical history significant for Hashimoto's disease presents to the emergency department complaining of a constant squeezing sensation in the left side of her chest which is been ongoing for 2 days.  She states she was swimming prior to onset.  Pain decreased somewhat after resting from swimming.  Her pain radiates to the left side of her neck and arm.  It is worse with rest and inspiration.  No pain with movement.  Pain is described as feeling gas pains.  She does have mild shortness of breath and nausea associated with the pain.  The patient denies fever, abdominal pain, vomiting, palpitations.  At time of shift handoff workup has been reassuring.  No significant abnormalities on BMP, CBC.  Negative pregnancy test, initial troponin 3.  Chest x-ray unremarkable.  Plan for delta troponin.  If troponin remains flat plan for discharge home with outpatient cardiology follow-up.  Second troponin 3.  Nonischemic EKG no signs of ACS at this time.  Question of this being musculoskeletal in nature.  Plan to discharge at this time.  Cardiology referral placed.  Return precautions provided.      Darrick Grinder, PA-C 06/26/22 0827    Pricilla Loveless, MD 06/26/22 [phone]

## 2022-06-26 NOTE — ED Provider Notes (Signed)
[place] EMERGENCY DEPARTMENT AT [facility] Provider Note   CSN: [id number] Arrival date & time: 06/26/22  0444     History  Chief Complaint  Patient presents with   Chest Pain    [name] is a 37 y.o. female with a PMHx of Hashimoto's, who presents to the Emergency Department complaining of constant, squeezing sensation to left-sided chest onset 2 days.  Notes that she was swimming prior to the onset of her symptoms.  While swimming she stopped due to concerns for sharp pain in the left side of her chest.  She rested for 10 minutes however still noted pain to the area that has slightly decreased.  Her left-sided chest pain radiates to her left-sided neck and arm.  She notes that the pain has improved some symptom initially started however has not fully resolved.  Her pain is worse with rest and inspiration.  No pain with movement.  Patient notes that the pain feels like gas pains in her chest.  Has associated shortness of breath and nausea.  No meds tried prior to arrival.  Denies fever, abdominal pain, vomiting, palpitations. Denies PMHx of MI, DM, HTN, CAD, family history of MI in someone younger than age 52, stents.  Patient is on estrogen cream for vaginal atrophy.  Pt denies recent travel, immobilization, surgery, birth control use, or PMHx of PE/DVT. No thinners.   The history is provided by the patient. No language interpreter was used.       Home Medications Prior to Admission medications   Medication Sig Start Date End Date Taking? Authorizing Provider  cetirizine (ZYRTEC) 10 MG tablet Take 10 mg by mouth daily.    [provider]  Cholecalciferol 125 MCG (5000 UT) TABS Take 1 tablet by mouth daily at 6 (six) AM.    [provider]  MAGNESIUM GLYCINATE PO Take by mouth.    [provider]  Misc Natural Products (ADRENAL PO) Take by mouth.    [provider]  Misc Natural Products (CORTISOL MANAGER PO) Take by mouth.     [provider]  NON FORMULARY SELENIUM    [provider]  Omega-3 Fatty Acids (FISH OIL) 1000 MG CAPS Take 1 each by mouth.    [provider]  Prenatal MV-Min-Fe Fum-FA-DHA (PRENATAL/FOLIC ACID+DHA PO) Take 1 tablet by mouth daily at 6 (six) AM.    [provider]  Probiotic Product (PROBIOTIC DAILY PO) Take 1 capsule by mouth daily at 6 (six) AM.    [provider]      Allergies    Banana and Spinach    Review of Systems   Review of Systems  Constitutional:  Negative for fever.  Respiratory:  Positive for shortness of breath.   Cardiovascular:  Positive for chest pain. Negative for palpitations.  Gastrointestinal:  Positive for nausea. Negative for abdominal pain and vomiting.  All other systems reviewed and are negative.   Physical Exam Updated Vital Signs BP 128/79 (BP Location: Right Arm)   Pulse 70   Temp 97.9 F (36.6 C)   Resp 18   LMP 04/29/2022   SpO2 99%  Physical Exam Vitals and nursing note reviewed.  Constitutional:      General: She is not in acute distress.    Appearance: She is not diaphoretic.  HENT:     Head: Normocephalic and atraumatic.     Mouth/Throat:     Pharynx: No oropharyngeal exudate.  Eyes:  General: No scleral icterus.    Conjunctiva/sclera: Conjunctivae normal.  Cardiovascular:     Rate and Rhythm: Normal rate and regular rhythm.     Pulses: Normal pulses.     Heart sounds: Normal heart sounds.  Pulmonary:     Effort: Pulmonary effort is normal. No respiratory distress.     Breath sounds: Normal breath sounds. No wheezing.     Comments: Patient able to speak in clear complete sentences. Chest:     Chest wall: No tenderness.     Comments: No chest wall tenderness to palpation. Abdominal:     General: Bowel sounds are normal.     Palpations: Abdomen is soft. There is no mass.     Tenderness: There is no abdominal tenderness. There is no guarding or rebound.  Musculoskeletal:         General: Normal range of motion.     Cervical back: Normal range of motion and neck supple.  Skin:    General: Skin is warm and dry.  Neurological:     Mental Status: She is alert.  Psychiatric:        Behavior: Behavior normal.     ED Results / Procedures / Treatments   Labs (all labs ordered are listed, but only abnormal results are displayed) Labs Reviewed  CBC  BASIC METABOLIC PANEL  I-STAT BETA HCG BLOOD, ED ([facility], [facility], [facility] ONLY)  TROPONIN I (HIGH SENSITIVITY)    EKG None  Radiology No results found.  Procedures Procedures    Medications Ordered in ED Medications - No data to display  ED Course/ Medical Decision Making/ A&P Clinical Course as of 06/26/22 0533  Wed Jun 26, 2022  0532 Offered medication to aid with pain at this time, the patient declines at this time. [SB]    Clinical Course User Index [SB] Joyann Spidle A, PA-C                             Medical Decision Making Amount and/or Complexity of Data Reviewed Labs: ordered. Radiology: ordered.   Patient presents to the ED with left-sided chest pain chest pain x 2 days. No prior history of MI, catheterization, DVT/PE. Vital signs patient afebrile, patient not hypoxic or tachycardic. On exam patient with no chest wall tenderness to palpation. Otherwise, no acute cardiovascular, respiratory, abdominal findings. Differential diagnosis includes ACS, aortic dissection, pneumothorax, PE, PNA.     Labs:  I ordered, and personally interpreted labs.  The pertinent results include:   Initial troponin at 3, delta troponin ordered with results pending at time of sign out CBC unremarkable Negative hCG BMP unremarkable  Imaging: I ordered imaging studies including CXR I independently visualized and interpreted imaging which showed: no acute findings I agree with the radiologist interpretation   Patient case discussed with Barrie Dunker, PA-C at sign-out. Plan at sign-out is pending delta trop,  disposition as per results, however, plans may change as per oncoming team. Patient care transferred at sign out.    This chart was dictated using voice recognition software, Dragon. Despite the best efforts of this provider to proofread and correct errors, errors may still occur which can change documentation meaning.  Final Clinical Impression(s) / ED Diagnoses Final diagnoses:  Chest pain, unspecified type    Rx / DC Orders ED Discharge Orders     None         Bo Rogue A, PA-C 06/26/22 0640    Palumbo,  April, MD 06/26/22 1308

## 2022-06-26 NOTE — Discharge Instructions (Addendum)
You were evaluated today for chest pain.  Your workup was reassuring.  I have placed a referral with cardiology.  Please follow-up with their office for further evaluation and management.  If your chest pain worsens, you become short of breath, or develop other life-threatening symptoms please return to the emergency department.

## 2022-06-26 NOTE — ED Triage Notes (Signed)
Patient reports intermittent left chest pain radiating to left arm with mild SOB for 3 days , denies emesis or diaphoresis .

## 2022-06-27 ENCOUNTER — Telehealth: Payer: Self-pay

## 2022-06-27 NOTE — Transitions of Care (Post Inpatient/ED Visit) (Signed)
   06/27/2022  Name: [name] MRN: [id number] DOB: [birth year]  Today's TOC FU Call Status:    Attempted to reach the patient regarding the most recent Inpatient/ED visit.  Follow Up Plan: Additional outreach attempts will be made to reach the patient to complete the Transitions of Care (Post Inpatient/ED visit) call.   Signature : Guss Bunde [facility]

## 2022-07-25 ENCOUNTER — Encounter (INDEPENDENT_AMBULATORY_CARE_PROVIDER_SITE_OTHER): Payer: Self-pay | Admitting: Family Medicine

## 2022-07-25 ENCOUNTER — Ambulatory Visit (INDEPENDENT_AMBULATORY_CARE_PROVIDER_SITE_OTHER): Payer: No Typology Code available for payment source | Admitting: Family Medicine

## 2022-07-25 VITALS — BP 109/76 | HR 75 | Temp 98.2°F | Ht 63.0 in | Wt 185.0 lb

## 2022-07-25 DIAGNOSIS — E669 Obesity, unspecified: Secondary | ICD-10-CM | POA: Diagnosis not present

## 2022-07-25 DIAGNOSIS — Z72820 Sleep deprivation: Secondary | ICD-10-CM | POA: Diagnosis not present

## 2022-07-25 DIAGNOSIS — R635 Abnormal weight gain: Secondary | ICD-10-CM | POA: Insufficient documentation

## 2022-07-25 DIAGNOSIS — E282 Polycystic ovarian syndrome: Secondary | ICD-10-CM | POA: Diagnosis not present

## 2022-07-25 DIAGNOSIS — E7800 Pure hypercholesterolemia, unspecified: Secondary | ICD-10-CM

## 2022-07-25 DIAGNOSIS — Z6832 Body mass index (BMI) 32.0-32.9, adult: Secondary | ICD-10-CM

## 2022-07-25 NOTE — Assessment & Plan Note (Signed)
Lab Results  Component Value Date   CHOL 242 (H) 06/18/2022   HDL 60 06/18/2022   LDLCALC 171 (H) 06/18/2022   TRIG 65 06/18/2022   CHOLHDL 4.0 06/18/2022   Patient has had elevated cholesterol readings.  She is currently not on any cholesterol-lowering medication.  She is working on a low saturated fat healthy diet, regular exercise and weight loss.  Will plan to recheck fasting lipid panel in the next 3 months.  Continue working on healthy dietary changes.

## 2022-07-25 NOTE — Assessment & Plan Note (Signed)
Patient reports chronically poor sleep.  This occurred prior to having her baby.  She has had a hard time going to sleep at night and staying asleep at night.  She has not been tested for obstructive sleep apnea.  She has started an over-the-counter magnesium supplement.  Her magnesium level has not yet been checked.    We discussed the importance of adequate sleep for weight management. Aim for 7 to 8 hours of sleep at night and will consider polysomnogram. Check magnesium level today

## 2022-07-25 NOTE — Assessment & Plan Note (Signed)
Patient has a history of PCOS.  She is currently off birth control.  Her last menstrual period was approximately 1 month ago.  She does not feel like she is pregnant but has not done a home pregnancy test.  She has a constellation of symptoms occurring today.  Will check a quantitative beta-hCG, known, estrogen, DHEA-S,  progesterone level.  She endorses difficulty with weight loss despite efforts, chronic fatigue, hirsutism and facial weight gain.  Follow-up results and will CC OB/GYN if needed.

## 2022-07-25 NOTE — Progress Notes (Signed)
Office: [phone]  /  Fax: [phone]  WEIGHT SUMMARY AND BIOMETRICS  Starting Date: 03/06/22  Starting Weight: 184lb   Weight Lost Since Last Visit: 0lb   Vitals Temp: 98.2 F (36.8 C) BP: 109/76 Pulse Rate: 75 SpO2: 96 %   Body Composition  Body Fat %: 39.5 % Fat Mass (lbs): 73.2 lbs Muscle Mass (lbs): 106.4 lbs Total Body Water (lbs): 75.4 lbs Visceral Fat Rating : 8     HPI  Chief Complaint: OBESITY  [name] is here to discuss her progress with her obesity treatment plan. She is on the the Category 2 Plan and states she is following her eating plan approximately 60 % of the time. She states she is exercising 30 minutes 3 times per week.   Interval History:  Since last office visit she is up 2 lb Exercise has been limited x 4 weeks after going to the ED for chest pain with swimming She ruled out for ACS and is doing some short run / walk intervals at home She is set up to see cardiology  Work has been stressful She has had palpitations at rest, heartburn and recurring L sided chest pain with sleeping, movement, deep breathing She is scheduled to see Dr Elease Hashimoto 7/23 She is off birth control Periods have been regular since she had her baby 2.5 years ago She has more mood fluctuations, some nightsweats and poor sleep at night (not new)  Pharmacotherapy: none  PHYSICAL EXAM:  Blood pressure 109/76, pulse 75, temperature 98.2 F (36.8 C), height 5\' 3"  (1.6 m), weight 185 lb (83.9 kg), SpO2 96 %, unknown if currently breastfeeding. Body mass index is 32.77 kg/m.  General: She is overweight, cooperative, alert, well developed, and in no acute distress. PSYCH: Has normal mood, affect and thought process.   Lungs: Normal breathing effort, no conversational dyspnea.   ASSESSMENT AND PLAN  TREATMENT PLAN FOR OBESITY:  Recommended Dietary Goals  [name] is currently in the action stage of change. As such, her goal is to continue weight management  plan. She has agreed to the Category 2 Plan.  Behavioral Intervention  We discussed the following Behavioral Modification Strategies today: increasing lean protein intake, decreasing simple carbohydrates , increasing vegetables, increasing lower glycemic fruits, increasing water intake, work on meal planning and preparation, keeping healthy foods at home, continue to practice mindfulness when eating, and planning for success.  Additional resources provided today: NA  Recommended Physical Activity Goals  [name] has been advised to work up to 150 minutes of moderate intensity aerobic activity a week and strengthening exercises 2-3 times per week for cardiovascular health, weight loss maintenance and preservation of muscle mass.   She has agreed to Unable to participate in physical activity at present due to medical conditions  -Await upcoming cardiology visit, light walking okay Pharmacotherapy changes for the treatment of obesity: None  ASSOCIATED CONDITIONS ADDRESSED TODAY  Pure hypercholesterolemia Assessment & Plan: Lab Results  Component Value Date   CHOL 242 (H) 06/18/2022   HDL 60 06/18/2022   LDLCALC 171 (H) 06/18/2022   TRIG 65 06/18/2022   CHOLHDL 4.0 06/18/2022   Patient has had elevated cholesterol readings.  She is currently not on any cholesterol-lowering medication.  She is working on a low saturated fat healthy diet, regular exercise and weight loss.  Will plan to recheck fasting lipid panel in the next 3 months.  Continue working on healthy dietary changes.   Poor sleep Assessment & Plan: Patient reports chronically poor  sleep.  This occurred prior to having her baby.  She has had a hard time going to sleep at night and staying asleep at night.  She has not been tested for obstructive sleep apnea.  She has started an over-the-counter magnesium supplement.  Her magnesium level has not yet been checked.    We discussed the importance of adequate sleep for weight  management. Aim for 7 to 8 hours of sleep at night and will consider polysomnogram. Check magnesium level today    Orders: -     Magnesium  Weight gain -     Cortisol -     Beta hCG quant (ref lab)  PCOS (polycystic ovarian syndrome) Assessment & Plan: Patient has a history of PCOS.  She is currently off birth control.  Her last menstrual period was approximately 1 month ago.  She does not feel like she is pregnant but has not done a home pregnancy test.  She has a constellation of symptoms occurring today.  Will check a quantitative beta-hCG, known, estrogen, DHEA-S,  progesterone level.  She endorses difficulty with weight loss despite efforts, chronic fatigue, hirsutism and facial weight gain.  Follow-up results and will CC OB/GYN if needed.  Orders: -     TestT+DHEA S+Prog+E2+TestFW -     Beta hCG quant (ref lab)  Generalized obesity with starting BMI 32  BMI 32.0-32.9,adult      She was informed of the importance of frequent follow up visits to maximize her success with intensive lifestyle modifications for her multiple health conditions.   ATTESTASTION STATEMENTS:  Reviewed by clinician on day of visit: allergies, medications, problem list, medical history, surgical history, family history, social history, and previous encounter notes pertinent to obesity diagnosis.   I have personally spent 30 minutes total time today in preparation, patient care, nutritional counseling and documentation for this visit, including the following: review of clinical lab tests; review of medical tests/procedures/services.      Glennis Brink, DO DABFM, DABOM [facility] [address] [place], [place] [address] [phone]

## 2022-07-26 LAB — TESTT+DHEA S+PROG+E2+TESTFW
Progesterone: 0.5 ng/mL
Testosterone: 20 ng/dL (ref 8–60)

## 2022-07-26 LAB — BETA HCG QUANT (REF LAB): hCG Quant: <1 m[IU]/mL

## 2022-07-27 LAB — CORTISOL: Cortisol: 11.6 ug/dL (ref 6.2–19.4)

## 2022-07-27 LAB — TESTT+DHEA S+PROG+E2+TESTFW
DHEA-SO4: 178 ug/dL (ref 57.3–279.2)
Estradiol: 41.9 pg/mL

## 2022-07-27 LAB — MAGNESIUM: Magnesium: 2.2 mg/dL (ref 1.6–2.3)

## 2022-07-30 IMAGING — US US MFM OB FOLLOW-UP
1 series · 13 of 28 positions shown · non-contrast
Comparison: none

[Series 1: us mfm ob follow-up · 46 acquisitions, 13 frames shown]
[im 2/46]
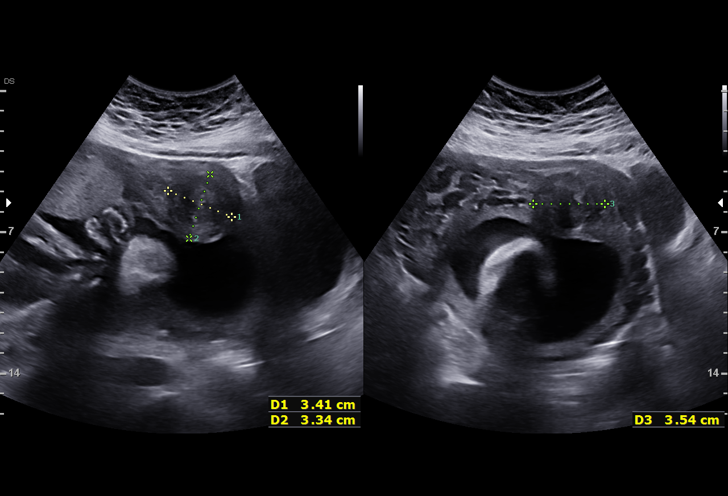
[im 6/46]
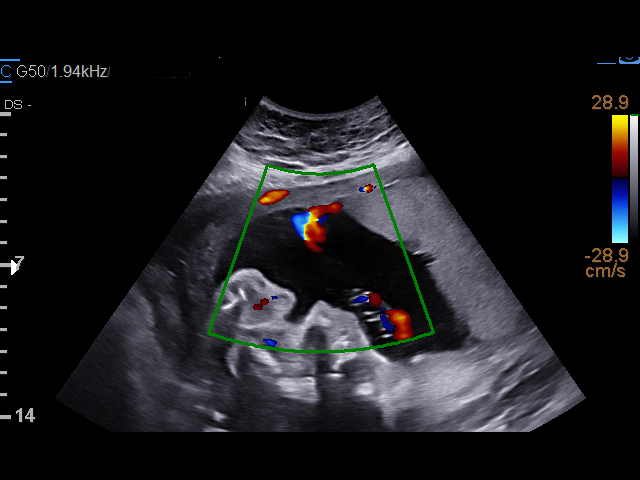
[im 9/46]
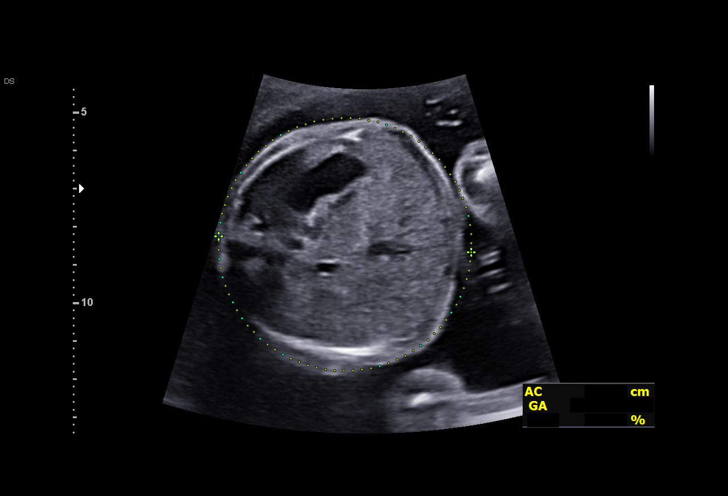
[im 12/46]
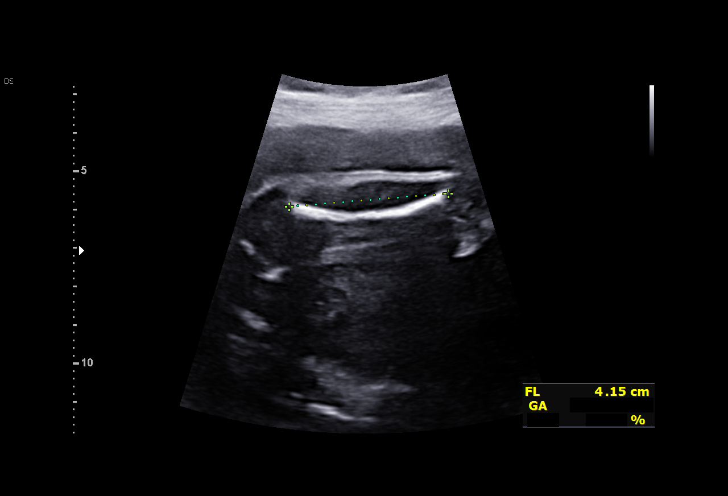
[im 16/46]
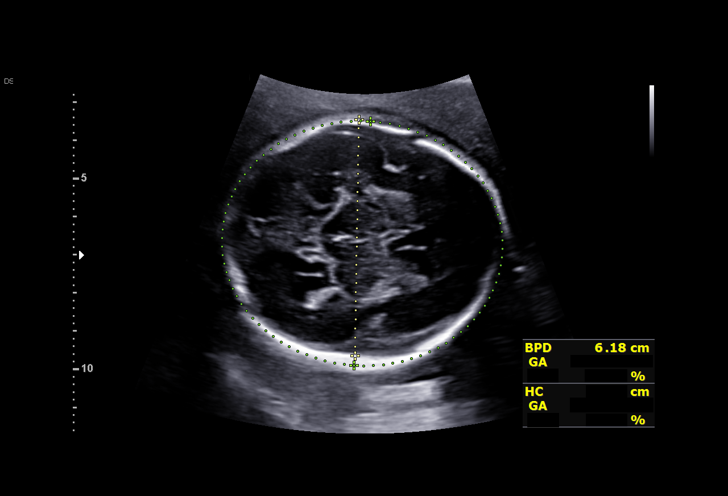
[im 19/46]
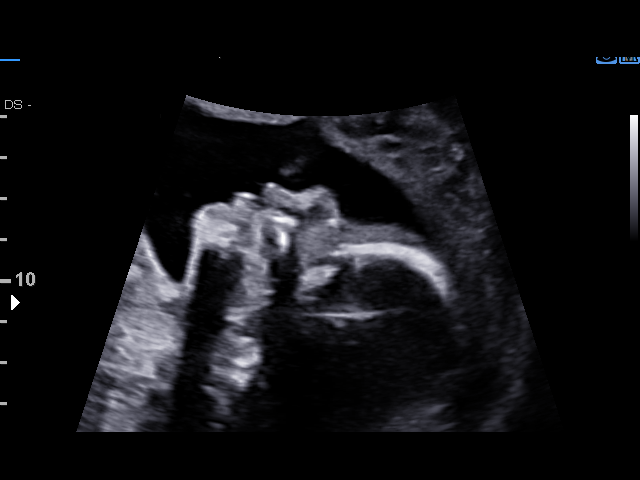
[im 24/46]
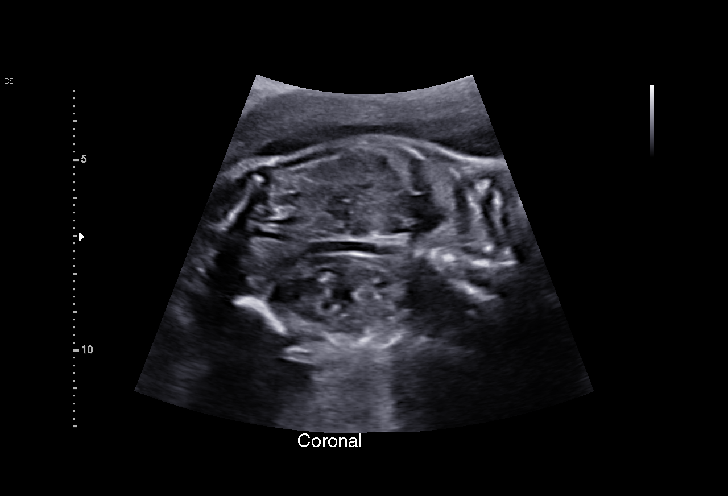
[im 27/46]
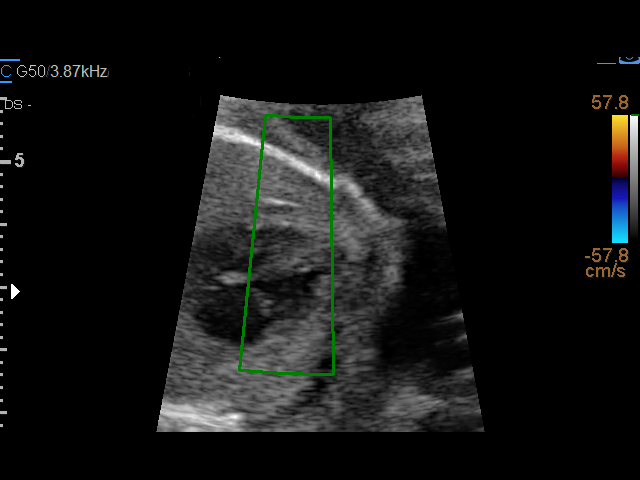
[im 31/46]
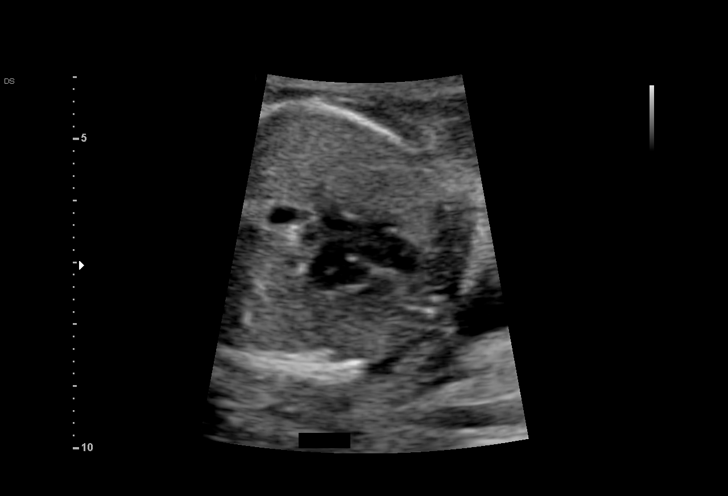
[im 34/46]
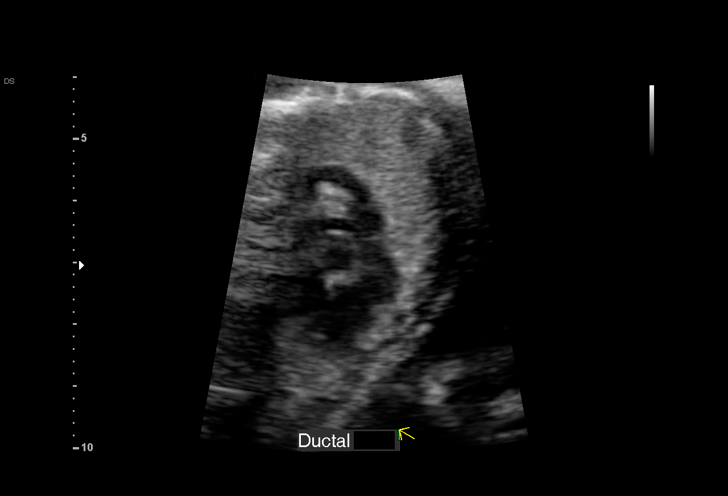
[im 37/46]
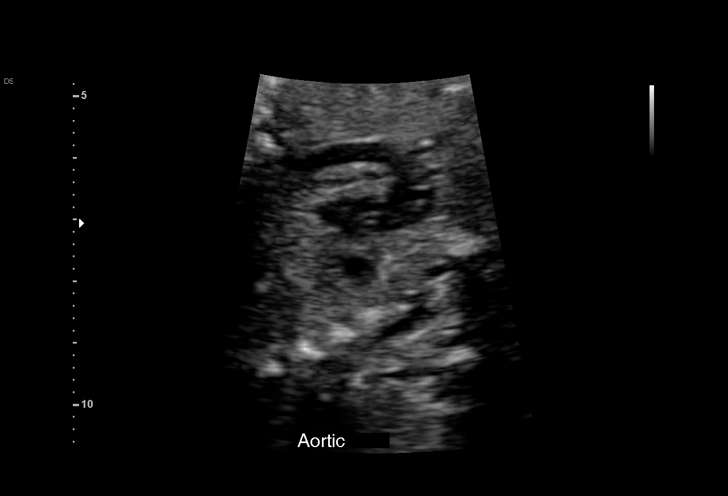
[im 41/46]
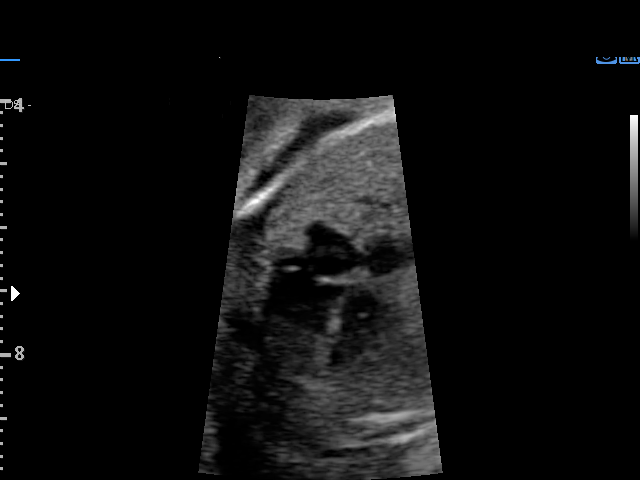
[im 44/46]
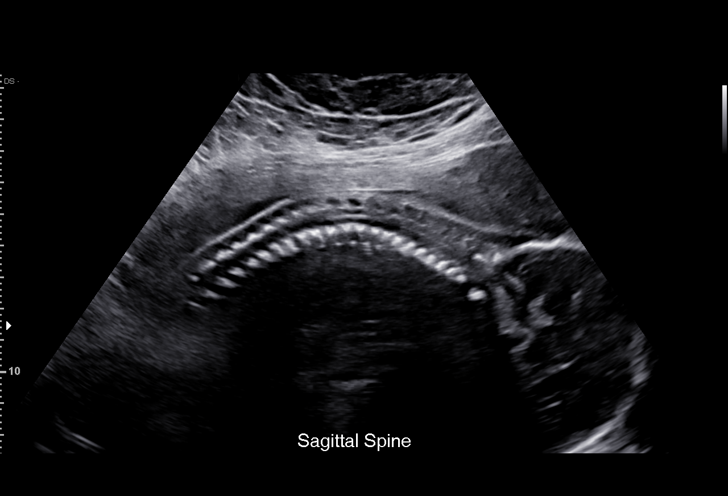

[13 of 28 positions shown; findings below may reference images not displayed]

[REDACTED]. [HOSPITAL]
                   DO

Indications

 24 weeks gestation of pregnancy
 Elevated MSAFP
 Velamentous insertion of umbilical cord
 Echogenic intracardiac focus of the heart
 (EIF)
 Uterine fibroids affecting pregnancy in        O34.12,
 second trimester, antepartum
 Encounter for other antenatal screening
 follow-up
 Fetal demise less than 22 weeks (Twin A @
 88weeks)
Fetal Evaluation

 Num Of Fetuses:         1
 Cardiac Activity:       Observed
 Presentation:           Cephalic
 Placenta:               Anterior
 P. Cord Insertion:      Velamentous insertion

 Amniotic Fluid
 AFI FV:      Within normal limits

                             Largest Pocket(cm)

Biometry
 BPD:      61.2  mm     G. Age:  24w 6d         70  %    CI:        77.98   %    70 - 86
                                                         FL/HC:      18.6   %    18.7 -
 HC:      219.3  mm     G. Age:  24w 0d         25  %    HC/AC:      1.07        1.05 -
 AC:      204.3  mm     G. Age:  25w 0d         69  %    FL/BPD:     66.7   %    71 - 87
 FL:       40.8  mm     G. Age:  23w 2d         13  %    FL/AC:      20.0   %    20 - 24
 LV:        7.6  mm

 Est. FW:     677  gm      1 lb 8 oz     46  %
OB History

 Gravidity:    1         Term:   0
 Living:       0
Gestational Age

 U/S Today:     24w 2d                                        EDD:   03/06/20
 Best:          24w 1d     Det. By:  Previous Ultrasound      EDD:   03/07/20
                                     (07/22/19)
Anatomy

 Cranium:               Appears normal         LVOT:                   Appears normal
 Cavum:                 Appears normal         Aortic Arch:            Appears normal
 Ventricles:            Appears normal         Ductal Arch:            Appears normal
 Choroid Plexus:        Appears normal         Diaphragm:              Appears normal
 Cerebellum:            Appears normal         Stomach:                Appears normal, left
                                                                       sided
 Posterior Fossa:       Appears normal         Abdomen:                Previously seen
 Nuchal Fold:           Not applicable (>20    Abdominal Wall:         Appears nml (cord
                        wks GA)                                        insert, abd wall)
 Face:                  Appears normal         Cord Vessels:           Appears normal (3
                        (orbits and profile)                           vessel cord)
 Lips:                  Appears normal         Kidneys:                Appear normal
 Palate:                Appears normal         Bladder:                Appears normal
 Thoracic:              Appears normal         Spine:                  Appears normal
 Heart:                 Previously seen        Upper Extremities:      Previously seen
                        bilateral EIF
 RVOT:                  Appears normal         Lower Extremities:      Previously seen

 Other:  Fetus appears to be a male. Nasal bone visualized. VC, 3VV and
         3VTV visualized.
Cervix Uterus Adnexa

 Cervix
 Length:           3.46  cm.
 Normal appearance by transabdominal scan.

 Uterus
 Multiple previously seen fibroids. Largest measures
 3.41 x  3.34  x 3.54 cm. in the Left LUS.

 Right Ovary
 Within normal limits.
 Left Ovary
 Within normal limits.
Impression

 Increased MSAFP seen on serum screening (3.1 MoM).
 Patient return for completion of fetal anatomy.
 Fetal growth is appropriate for gestational age .Amniotic fluid
 is normal and good fetal activity is seen .Fetal anatomical
 survey was completed and appears normal.  Fetal spine and
 abdominal wall appear normal.
 Multiple small myomas are seen.  Velamentous cord insertion
 is seen again.

 I informed the couple that ultrasound has limitations in
 accurately diagnosing velamentous cord insertion.
Recommendations

 -Fetal growth assessment every 4 weeks.
 -Weekly BPP from 32 weeks gestation till delivery.
 -Follow-up growth assessments and antenatal testing may be
 performed at your office for patient's convenience.
 -Alternatively, she can return for ultrasound assessments
 here.
                 Rought, [place]

## 2022-08-25 ENCOUNTER — Encounter: Payer: Self-pay | Admitting: Cardiovascular Disease

## 2022-08-25 NOTE — Progress Notes (Unsigned)
Cardiology Office Note:  .   Date:  08/27/2022  ID:  [name], DOB 20-Aug-1985, MRN [id number] PCP: Octavia Heir, NP  [facility] Providers Cardiologist:  New to [name]  Click to update primary MD,subspecialty MD or APP then REFRESH:1}   History of Present Illness: .   [name] is a 37 y.o. female with hx of CP, hashimotos   We were asked to see her for further evaluation and management   She was swimming in May, had been swimming since March  Developed CP , was pushing a bit harder that day  Went to the ER  Work up was negative in the ER   She tried swimming several weeks later but developed more cp  Pain is not related to twisting or turning her torso  Now , she is not able to exercise much without having cp  Now , has CP walking up hill   LDL was 171 in May, 2024  Eats a fairly healthy diet  Avoid processed foods  Does not drink Non smoker    Had a normal pregnancy several years ago ( had a twin loss)   Fam hx : Mother had HTN, AS Father :  HTN Aunt : died at 71 of a valvular issue ( congenital heart issues )    Works in occupational therapy   ROS:   Studies Reviewed: Marland Kitchen                 EKG Interpretation Date/Time:  Tuesday August 27 2022 09:04:35 EDT Ventricular Rate:  73 PR Interval:  178 QRS Duration:  72 QT Interval:  380 QTC Calculation: 418 R Axis:   82  Text Interpretation: Normal sinus rhythm Normal ECG When compared with ECG of 26-Jun-2022 05:38, No significant change since last tracing Confirmed by Kristeen Miss ([id number]) on 08/27/2022 9:13:37 AM     Risk Assessment/Calculations:             Physical Exam:   VS:  BP 112/78   Pulse 73   Ht 5\' 3"  (1.6 m)   Wt 189 lb 12.8 oz (86.1 kg)   SpO2 99%   BMI 33.62 kg/m    Wt Readings from Last 3 Encounters:  08/27/22 189 lb 12.8 oz (86.1 kg)  07/25/22 185 lb (83.9 kg)  06/18/22 183 lb (83 kg)    GEN: Well nourished, well developed in no acute distress NECK: No JVD; No  carotid bruits CARDIAC: RRR,  soft systolic outflow murmur  RESPIRATORY:  Clear to auscultation without rales, wheezing or rhonchi  ABDOMEN: Soft, non-tender, non-distended EXTREMITIES:  No edema; No deformity        ASSESSMENT AND PLAN: .     1.  Exertional chest pain: She had a severe episode of chest discomfort that was described as a deep tightness with this occurred when she was swimming in the pool and pushing her pace more than usual.  Since that time she is continue to have episodes of chest tightness with any sort of exertion.  She is fairly young but she may have SCAD ( spontaneous coronary artery dissection) .  She has a history of hyperlipidemia.  Will get a coronary CTA for further evaluation.  2.  Hyperlipidemia: Her last LDL was 171.  I would like to draw a lipoprotein a.  Will address this after we get the coronary CTA information.  She will continue to work on diet, exercise, weight loss.   I will see her  in 3 months        Dispo:   Signed, Kristeen Miss, MD

## 2022-08-27 ENCOUNTER — Encounter: Payer: Self-pay | Admitting: Cardiovascular Disease

## 2022-08-27 ENCOUNTER — Ambulatory Visit
Payer: No Typology Code available for payment source | Attending: Cardiovascular Disease | Admitting: Cardiovascular Disease

## 2022-08-27 VITALS — BP 112/78 | HR 73 | Ht 63.0 in | Wt 189.8 lb

## 2022-08-27 DIAGNOSIS — E782 Mixed hyperlipidemia: Secondary | ICD-10-CM

## 2022-08-27 DIAGNOSIS — R079 Chest pain, unspecified: Secondary | ICD-10-CM

## 2022-08-27 DIAGNOSIS — Z01810 Encounter for preprocedural cardiovascular examination: Secondary | ICD-10-CM | POA: Diagnosis not present

## 2022-08-27 MED ORDER — METOPROLOL TARTRATE 100 MG PO TABS: ORAL_TABLET | ORAL | 0 refills | Status: DC

## 2022-08-27 NOTE — Patient Instructions (Addendum)
Medication Instructions:  Your physician recommends that you continue on your current medications as directed. Please refer to the Current Medication list given to you today.  *If you need a refill on your cardiac medications before your next appointment, please call your pharmacy*  Lab Work: Lipoprotein (a) today If you have labs (blood work) drawn today and your tests are completely normal, you will receive your results only by: MyChart Message (if you have MyChart) OR A paper copy in the mail If you have any lab test that is abnormal or we need to change your treatment, we will call you to review the results.  Testing/Procedures: ECHO Your physician has requested that you have an echocardiogram. Echocardiography is a painless test that uses sound waves to create images of your heart. It provides your doctor with information about the size and shape of your heart and how well your heart's chambers and valves are working. This procedure takes approximately one hour. There are no restrictions for this procedure. Please do NOT wear cologne, perfume, aftershave, or lotions (deodorant is allowed). Please arrive 15 minutes prior to your appointment time.  Coronary CT Angiogram Your physician has requested that you have cardiac CT. Cardiac computed tomography (CT) is a painless test that uses an x-ray machine to take clear, detailed pictures of your heart. For further information please visit [email or web address] Please follow instruction sheet as given.  Follow-Up: At [facility], you and your health needs are our priority.  As part of our continuing mission to provide you with exceptional heart care, we have created designated Provider Care Teams.  These Care Teams include your primary Cardiologist (physician) and Advanced Practice Providers (APPs -  Physician Assistants and Nurse Practitioners) who all work together to provide you with the care you need, when you need it.   Your next  appointment:   3 month(s)  Provider:   Kristeen Miss, MD   Other Instructions   Your cardiac CT will be scheduled at one of the below locations:   [facility] [address] [place], [place] [address] [phone]  Please arrive at the [facility] (Entrance C2) of [facility] 30 minutes prior to test start time. You can use the FREE valet parking offered at entrance C (encouraged to control the heart rate for the test)  Proceed to the [facility] Radiology Department (first floor) to check-in and test prep.  All radiology patients and guests should use entrance C2 at [facility], accessed from [facility], even though the hospital's physical address listed is [address]     Please follow these instructions carefully (unless otherwise directed):  An IV will be required for this test and Nitroglycerin will be given.   On the Night Before the Test: Be sure to Drink plenty of water. Do not consume any caffeinated/decaffeinated beverages or chocolate 12 hours prior to your test. Do not take any antihistamines 12 hours prior to your test.  On the Day of the Test: Drink plenty of water until 1 hour prior to the test. Do not eat any food 1 hour prior to test. You may take your regular medications prior to the test.  Take metoprolol (Lopressor) two hours prior to test. FEMALES- please wear underwire-free bra if available, avoid dresses & tight clothing      After the Test: Drink plenty of water. After receiving IV contrast, you may experience a mild flushed feeling. This is normal. On  occasion, you may experience a mild rash up to 24 hours after the test. This is not dangerous. If this occurs, you can take Benadryl 25 mg and increase your fluid intake. If you experience trouble breathing, this can be serious. If it is severe call 911 IMMEDIATELY. If it is mild, please call our office. If you take any of  these medications: Glipizide/Metformin, Avandament, Glucavance, please do not take 48 hours after completing test unless otherwise instructed.  We will call to schedule your test 2-4 weeks out understanding that some insurance companies will need an authorization prior to the service being performed.   For more information and frequently asked questions, please visit our website : [email or web address]  For non-scheduling related questions, please contact the cardiac imaging nurse navigator should you have any questions/concerns: Cardiac Imaging Nurse Navigators Direct Office Dial: [phone]   For scheduling needs, including cancellations and rescheduling, please call [place], [phone].

## 2022-08-28 LAB — BASIC METABOLIC PANEL
BUN/Creatinine Ratio: 22 (ref 9–23)
BUN: 14 mg/dL (ref 6–20)
CO2: 23 mmol/L (ref 20–29)
Calcium: 9.1 mg/dL (ref 8.7–10.2)
Chloride: 106 mmol/L (ref 96–106)
Creatinine, Ser: 0.65 mg/dL (ref 0.57–1.00)
Glucose: 86 mg/dL (ref 70–99)
Potassium: 4.2 mmol/L (ref 3.5–5.2)
Sodium: 141 mmol/L (ref 134–144)
eGFR: 116 mL/min/{1.73_m2} (ref >59)

## 2022-08-28 LAB — LIPOPROTEIN A (LPA): Lipoprotein (a): 168.3 nmol/L — ABNORMAL HIGH (ref <75.0)

## 2022-08-29 ENCOUNTER — Ambulatory Visit (INDEPENDENT_AMBULATORY_CARE_PROVIDER_SITE_OTHER): Payer: No Typology Code available for payment source | Admitting: Family Medicine

## 2022-08-30 ENCOUNTER — Telehealth: Payer: Self-pay

## 2022-08-30 DIAGNOSIS — Z79899 Other long term (current) drug therapy: Secondary | ICD-10-CM

## 2022-08-30 DIAGNOSIS — E782 Mixed hyperlipidemia: Secondary | ICD-10-CM

## 2022-08-30 NOTE — Telephone Encounter (Signed)
-----   Message from Kristeen Miss sent at 08/29/2022 10:51 AM EDT -----  LP(a) is elevated   ( 168.3) She has a coronary CTA scheduled Her LDL is also elevated  Please start her on Rosuvastatin 20 mg a day  Check lipids, ALT in 3 months  She should eat a low fat, low cholesterol diet  Exercise was much as possible

## 2022-08-31 ENCOUNTER — Encounter: Payer: Self-pay | Admitting: Cardiovascular Disease

## 2022-09-02 ENCOUNTER — Encounter (HOSPITAL_COMMUNITY): Payer: Self-pay

## 2022-09-02 MED ORDER — ROSUVASTATIN CALCIUM 20 MG PO TABS
20.0000 mg | ORAL_TABLET | Freq: Every day | ORAL | 3 refills | Status: DC
Start: 2022-09-02 — End: 2022-10-10

## 2022-09-02 NOTE — Telephone Encounter (Signed)
Spoke with patient who agrees to plan. Medication sent to pharmacy on file. Labs entered and scheduled for 11/29/22.

## 2022-09-03 ENCOUNTER — Ambulatory Visit (HOSPITAL_BASED_OUTPATIENT_CLINIC_OR_DEPARTMENT_OTHER): Payer: PRIVATE HEALTH INSURANCE

## 2022-09-03 ENCOUNTER — Ambulatory Visit (HOSPITAL_COMMUNITY)
Admission: RE | Admit: 2022-09-03 | Discharge: 2022-09-03 | Disposition: A | Discharge status: Home / Self Care | Payer: PRIVATE HEALTH INSURANCE | Source: Ambulatory Visit | Attending: Cardiovascular Disease | Admitting: Cardiovascular Disease

## 2022-09-03 DIAGNOSIS — E782 Mixed hyperlipidemia: Secondary | ICD-10-CM | POA: Insufficient documentation

## 2022-09-03 DIAGNOSIS — R072 Precordial pain: Secondary | ICD-10-CM

## 2022-09-03 DIAGNOSIS — R079 Chest pain, unspecified: Secondary | ICD-10-CM | POA: Insufficient documentation

## 2022-09-03 LAB — ECHOCARDIOGRAM COMPLETE
Area-P 1/2: 4.1 cm2
S' Lateral: 2.7 cm

## 2022-09-03 MED ORDER — IOHEXOL 350 MG/ML SOLN
95.0000 mL | Freq: Once | INTRAVENOUS | Status: AC | PRN
  Administered 2022-09-03: 95 mL via INTRAVENOUS

## 2022-09-03 MED ORDER — NITROGLYCERIN 0.4 MG SL SUBL
0.8000 mg | SUBLINGUAL_TABLET | Freq: Once | SUBLINGUAL | Status: AC
  Administered 2022-09-03: 0.8 mg via SUBLINGUAL

## 2022-09-03 MED ORDER — NITROGLYCERIN 0.4 MG SL SUBL
SUBLINGUAL_TABLET | SUBLINGUAL | Status: AC
  Filled 2022-09-03: qty 2

## 2022-10-10 ENCOUNTER — Encounter: Payer: Self-pay | Admitting: Orthopedic Surgery

## 2022-10-10 ENCOUNTER — Ambulatory Visit: Payer: No Typology Code available for payment source | Admitting: Orthopedic Surgery

## 2022-10-10 VITALS — BP 112/80 | HR 76 | Temp 96.7°F | Resp 16 | Ht 63.0 in | Wt 191.0 lb

## 2022-10-10 DIAGNOSIS — R300 Dysuria: Secondary | ICD-10-CM

## 2022-10-10 DIAGNOSIS — R8290 Unspecified abnormal findings in urine: Secondary | ICD-10-CM | POA: Diagnosis not present

## 2022-10-10 DIAGNOSIS — J3089 Other allergic rhinitis: Secondary | ICD-10-CM | POA: Diagnosis not present

## 2022-10-10 DIAGNOSIS — E7800 Pure hypercholesterolemia, unspecified: Secondary | ICD-10-CM | POA: Diagnosis not present

## 2022-10-10 LAB — POCT URINALYSIS DIPSTICK
Bilirubin, UA: NEGATIVE
Blood, UA: NEGATIVE
Glucose, UA: NEGATIVE
Ketones, UA: POSITIVE
Nitrite, UA: POSITIVE
Protein, UA: NEGATIVE
Spec Grav, UA: <=1.005 — AB (ref 1.010–1.025)
Urobilinogen, UA: NEGATIVE U/dL — AB
pH, UA: 7.5 (ref 5.0–8.0)

## 2022-10-10 MED ORDER — PREDNISONE 20 MG PO TABS
ORAL_TABLET | ORAL | 0 refills | Status: AC
Start: 2022-10-10 — End: 2022-10-17

## 2022-10-10 NOTE — Progress Notes (Signed)
Careteam: Patient Care Team: Octavia Heir, NP as PCP - General (Adult Health Nurse Practitioner) Nahser, Deloris Ping, MD as PCP - Cardiology (Cardiology)  Seen by: Hazle Nordmann, AGNP-C  PLACE OF SERVICE:  [facility]  Advanced Directive information Does Patient Have a Medical Advance Directive?: No, Would patient like information on creating a medical advance directive?: No - Patient declined  Allergies  Allergen Reactions   Banana Nausea And Vomiting   Spinach     Stomach cramps    Chief Complaint  Patient presents with   Acute Visit    Patient complains of possible UTI. Patient symptoms are dysuria, and slight odor.      HPI: Patient is a 37 y.o. female seen today for acute visit due to dysuria and allergies.   2 days ago she had intermittent dysuria. Urine also has foul odor. No symptoms since. She has increased water intake.   She bought a house 1 month ago and has had increased nasal congestion (clear), watery eyes and burning throat. She has been taking benadryl prn,  Pataday eye drops and Zyrtec without success. She is concerned mold is in home. She recently had new HVAC installed. She is also using air purifier in bedroom at night. Other family members do not have symptoms. 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 She has never been tested for allergies. Afebrile. Vitals stable.     Review of Systems:  Review of Systems  Constitutional:  Negative for fever and malaise/fatigue.  HENT:  Positive for congestion and sore throat. Negative for ear pain and sinus pain.   Eyes:  Positive for discharge and redness. Negative for pain.  Respiratory:  Negative for cough, shortness of breath and wheezing.   Cardiovascular:  Negative for chest pain.  Gastrointestinal: Negative.   Genitourinary: Negative.   Musculoskeletal: Negative.   Neurological: Negative.   Endo/Heme/Allergies: Negative.   Psychiatric/Behavioral: Negative.       Past Medical History:  Diagnosis Date   Anemia    Anxiety    Back pain    Constipation    Hashimoto's disease    History of chicken pox    HSV infection    Joint pain    Lactose intolerance    Lower back pain    Palpitations    PCOS (polycystic ovarian syndrome)    PCOS (polycystic ovarian syndrome)    Right hip pain    SOBOE (shortness of breath on exertion)    Thyroid condition    Velamentous insertion of umbilical cord    Vitamin D deficiency    Vitiligo    Past Surgical History:  Procedure Laterality Date   NO PAST SURGERIES     PERINEAL LACERATION REPAIR N/A 03/01/2020   Procedure: SUTURE REPAIR PERINEAL LACERATION;  Surgeon: Candice Camp, MD;  Location: [facility] LD ORS;  Service: Gynecology;  Laterality: N/A;   Social History:   reports that she has never smoked. She has never used smokeless tobacco. She reports that she does not currently use alcohol after a past usage of about 1.0 standard drink of alcohol per week. She reports that she does not use drugs.  Family History  Problem Relation Age of Onset   Thyroid disease Mother    Hypertension Mother    Cancer Mother        Cervical Cancer   Heart disease Mother    Depression Mother    Anxiety disorder Mother    Obesity  Mother    Hypertension Father    Hepatitis C Father    Thyroid disease Maternal Grandmother    Thyroid disease Maternal Aunt    Stroke Neg Hx    Hyperlipidemia Neg Hx    Early death Neg Hx     Medications: Patient's Medications  New Prescriptions   No medications on file  Previous Medications   CETIRIZINE HCL (ZYRTEC PO)    Take 1 tablet by mouth daily.   MULTIPLE VITAMIN (MULTIVITAMIN ADULT PO)    Take 1 tablet by mouth daily.   OMEGA-3 FATTY ACIDS (FISH OIL PO)    Take 1 capsule by mouth daily.   SELENIUM 50 MCG TABS TABLET    Take 50 mcg by mouth daily.  Modified Medications   No medications on file  Discontinued Medications   METOPROLOL TARTRATE (LOPRESSOR) 100 MG TABLET    Take 1  tablet by mouth two hours prior to scan   ROSUVASTATIN (CRESTOR) 20 MG TABLET    Take 1 tablet (20 mg total) by mouth daily.    Physical Exam:  Vitals:   10/10/22 0905  BP: 112/80  Pulse: 76  Resp: 16  Temp: (!) 96.7 F (35.9 C)  SpO2: 97%  Weight: 191 lb (86.6 kg)  Height: 5\' 3"  (1.6 m)   Body mass index is 33.83 kg/m. Wt Readings from Last 3 Encounters:  10/10/22 191 lb (86.6 kg)  08/27/22 189 lb 12.8 oz (86.1 kg)  07/25/22 185 lb (83.9 kg)    Physical Exam Vitals reviewed.  Constitutional:      General: She is not in acute distress. HENT:     Head: Normocephalic.     Right Ear: There is no impacted cerumen.     Left Ear: There is no impacted cerumen.     Nose: Rhinorrhea present.     Right Turbinates: Enlarged and swollen.     Left Turbinates: Enlarged and swollen.     Right Sinus: No maxillary sinus tenderness or frontal sinus tenderness.     Left Sinus: No maxillary sinus tenderness or frontal sinus tenderness.     Mouth/Throat:     Mouth: Mucous membranes are moist.     Pharynx: No posterior oropharyngeal erythema.  Eyes:     General:        Right eye: No discharge.        Left eye: No discharge.  Cardiovascular:     Rate and Rhythm: Normal rate and regular rhythm.     Pulses: Normal pulses.     Heart sounds: Normal heart sounds.  Pulmonary:     Effort: Pulmonary effort is normal. No respiratory distress.     Breath sounds: Normal breath sounds. No wheezing or rales.  Abdominal:     Palpations: Abdomen is soft.  Musculoskeletal:     Cervical back: Neck supple.     Right lower leg: No edema.     Left lower leg: No edema.  Lymphadenopathy:     Cervical: No cervical adenopathy.  Skin:    General: Skin is warm.     Capillary Refill: Capillary refill takes less than 2 seconds.  Neurological:     General: No focal deficit present.     Mental Status: She is alert and oriented to person, place, and time.  Psychiatric:        Mood and Affect: Mood  normal.     Labs reviewed: Basic Metabolic Panel: Recent Labs    03/06/22 1347 06/18/22 1251 06/26/22  1610 07/25/22 0757 08/27/22 0953  NA 141  --  137  --  141  K 4.1  --  4.2  --  4.2  CL 102  --  103  --  106  CO2 18*  --  24  --  23  GLUCOSE 74  --  108*  --  86  BUN 9  --  14  --  14  CREATININE 0.70  --  0.75  --  0.65  CALCIUM 9.5  --  9.1  --  9.1  MG  --   --   --  2.2  --   TSH 3.070 2.600  --   --   --    Liver Function Tests: Recent Labs    03/06/22 1347  AST 20  ALT 15  ALKPHOS 56  BILITOT 0.4  PROT 7.7  ALBUMIN 4.8   No results for input(s): "LIPASE", "AMYLASE" in the last 8760 hours. No results for input(s): "AMMONIA" in the last 8760 hours. CBC: Recent Labs    03/06/22 1347 06/26/22 0459  WBC 6.7 6.9  HGB 15.1 14.5  HCT 45.0 42.4  MCV 92 91.2  PLT 245 247   Lipid Panel: Recent Labs    03/06/22 1347 06/18/22 1251  CHOL 258* 242*  HDL 56 60  LDLCALC 188* 171*  TRIG 84 65  CHOLHDL 4.6* 4.0   TSH: Recent Labs    03/06/22 1347 06/18/22 1251  TSH 3.070 2.600   A1C: Lab Results  Component Value Date   HGBA1C 5.2 03/06/2022     Assessment/Plan 1. Dysuria - onset 2 days ago - cont hydration with water - POC Urinalysis Dipstick> 2+ leukocytes - Culture, Urine  2. Abnormal urine odor - see above - POC Urinalysis Dipstick - Culture, Urine  3. Environmental and seasonal allergies - increased x 1 month - watery eyes, swollen nasal turbinates, lung sounds clear, no facial pain - do not suspect bacterial sinus infection - began with move to new home - advised to try Xyzal over Zyrtec - advised to try Artificial Tears over Pataday - advised to try saline nasal spray - cont air purifier - Ambulatory referral to Allergy - predniSONE (DELTASONE) 20 MG tablet; Take 2 tablets (40 mg total) by mouth daily with breakfast for 2 days, THEN 1 tablet (20 mg total) daily with breakfast for 5 days.  Dispense: 9 tablet; Refill: 0  4.  Pure hypercholesterolemia - evaluated by Dr. Melburn Popper - 07/30 CT coronary/chest no evidence of SCAD - Coronary calcium score 0 - LDL 171 - started on rosuvastatin but has not started  - schedule fasting lipid  - cont diet low in fat, processed and fried foods   Total time: 31 minutes. Greater than 50% of total time spent doing patient education regarding health maintenance, dysuria, and allergies including symptom/medication management.    Next appt: Visit date not found  Nyashia Raney Scherry Ran  [facility] [phone]

## 2022-10-10 NOTE — Patient Instructions (Signed)
Try Xyzal allergy medication> please take in the evening  Try saline nasal spray> make sure you hit sides of nose> you may use as may times as needed  Try artificial tears for eyes instead of allergy Pataday  Referral made for [facility]  Schedule PAP smear  Schedule lab visit to have fasting lipids done  Consider red yeast rice for elevated cholesterol

## 2022-10-11 LAB — URINE CULTURE
MICRO NUMBER:: 15427019
Result:: NO GROWTH
SPECIMEN QUALITY:: ADEQUATE

## 2022-11-25 ENCOUNTER — Ambulatory Visit (HOSPITAL_BASED_OUTPATIENT_CLINIC_OR_DEPARTMENT_OTHER): Payer: No Typology Code available for payment source | Admitting: Orthopaedic Surgery

## 2022-11-26 ENCOUNTER — Ambulatory Visit: Payer: No Typology Code available for payment source | Admitting: Allergy & Immunology

## 2022-11-28 ENCOUNTER — Encounter: Payer: Self-pay | Admitting: Cardiovascular Disease

## 2022-11-28 NOTE — Progress Notes (Signed)
  Cardiology Office Note:  .   Date:  11/28/2022  ID:  [name], DOB 1985/08/25, MRN [id number] PCP: Octavia Heir, NP  [facility] Providers Cardiologist:  New to [facility]  Click to update primary MD,subspecialty MD or APP then REFRESH:1}   History of Present Illness: .   [name] is a 37 y.o. female with hx of CP, hashimotos   We were asked to see her for further evaluation and management   She was swimming in May, had been swimming since March  Developed CP , was pushing a bit harder that day  Went to the ER  Work up was negative in the ER   She tried swimming several weeks later but developed more cp  Pain is not related to twisting or turning her torso  Now , she is not able to exercise much without having cp  Now , has CP walking up hill   LDL was 171 in May, 2024  Eats a fairly healthy diet  Avoid processed foods  Does not drink Non smoker    Had a normal pregnancy several years ago ( had a twin loss)   Fam hx : Mother had HTN, AS Father :  HTN Aunt : died at 59 of a valvular issue ( congenital heart issues )    Works in occupational therapy  Oct. 25, 2024 [name] is seen for follow up of her chest pan  We were concerned about SCAD Coronary CTA revealed  CAC score of 0 Minimal non calcified plaque in LAD and RCA .   No stenosis to suggest a cardiac etiology of her chest pain        ROS:   Studies Reviewed: .                       Risk Assessment/Calculations:           Physical Exam:     Physical Exam: unknown if currently breastfeeding.  No BP recorded.  {Refresh Note OR Click here to enter BP  :1}***    GEN:  Well nourished, well developed in no acute distress HEENT: Normal NECK: No JVD; No carotid bruits LYMPHATICS: No lymphadenopathy CARDIAC: RRR ***, no murmurs, rubs, gallops RESPIRATORY:  Clear to auscultation without rales, wheezing or rhonchi  ABDOMEN: Soft, non-tender, non-distended MUSCULOSKELETAL:   No edema; No deformity  SKIN: Warm and dry NEUROLOGIC:  Alert and oriented x 3       ASSESSMENT AND PLAN: .     1.  Exertional chest pain:    2.  Hyperlipidemia:             Dispo:   Signed, Kristeen Miss, MD

## 2022-11-29 ENCOUNTER — Ambulatory Visit: Payer: No Typology Code available for payment source | Admitting: Cardiovascular Disease

## 2022-11-29 ENCOUNTER — Ambulatory Visit: Payer: No Typology Code available for payment source

## 2022-12-26 ENCOUNTER — Other Ambulatory Visit: Payer: Self-pay

## 2022-12-26 ENCOUNTER — Encounter: Payer: Self-pay | Admitting: Allergy & Immunology

## 2022-12-26 ENCOUNTER — Ambulatory Visit: Payer: No Typology Code available for payment source | Admitting: Allergy & Immunology

## 2022-12-26 VITALS — BP 114/74 | HR 78 | Temp 98.3°F | Resp 16 | Ht 63.75 in | Wt 186.7 lb

## 2022-12-26 DIAGNOSIS — J310 Chronic rhinitis: Secondary | ICD-10-CM

## 2022-12-26 NOTE — Progress Notes (Signed)
NEW PATIENT  Date of Service/Encounter:  12/26/22  Consult requested by: Octavia Heir, NP   Assessment:   Chronic rhinitis - planning for skin testing at the next visit  Plan/Recommendations:   1. Chronic rhinitis - Because of insurance stipulations, we cannot do skin testing on the same day as your first visit. - We are all working to fight this, but for now we need to do two separate visits.  - We will know more after we do testing at the next visit.  - The skin testing visit can be squeezed in at your convenience.  - Then we can make a more full plan to address all of your symptoms. - Be sure to stop your antihistamines for 3 days before this appointment.   2. Return in about 1 week (around 01/02/2023). You can have the follow up appointment with Dr. Dellis Anes or a Nurse Practicioner (our Nurse Practitioners are excellent and always have Physician oversight!).    This note in its entirety was forwarded to the Provider who requested this consultation.  Subjective:   [name] is a 37 y.o. female presenting today for evaluation of  Chief Complaint  Patient presents with   Allergic Rhinitis     [name] has a history of the following: Patient Active Problem List   Diagnosis Date Noted   Poor sleep 07/25/2022   Weight gain 07/25/2022   Pure hypercholesterolemia 03/20/2022   Generalized obesity with starting BMI 32 03/06/2022   Low back pain 03/06/2022   Vitamin D deficiency 03/06/2022   SOBOE (shortness of breath on exertion) 03/06/2022   Other fatigue 03/06/2022   Anxiety 07/31/2021   Gastroesophageal reflux disease without esophagitis 07/31/2021   Genital herpes simplex 07/31/2021   History of abnormal cervical Papanicolaou smear 07/31/2021   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   Subclinical hyperthyroidism 07/31/2021   Twin pregnancy 07/31/2021   Polycystic ovary syndrome 07/31/2021   Hashimoto's disease 05/21/2021   Thyroid antibody positive 01/24/2021    Vitiligo 01/24/2021   Pregnancy 02/29/2020   Vaginal tear resulting from childbirth 02/29/2020   Hyperandrogenemia 02/04/2012   PCOS (polycystic ovarian syndrome) 02/04/2012    History obtained from: chart review and patient.  Discussed the use of AI scribe software for clinical note transcription with the patient and/or guardian, who gave verbal consent to proceed.  [name] was referred by Octavia Heir, NP.     [name] is a 37 y.o. female presenting for an evaluation of concern for environmental allergies .  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 She reports immediate onset of symptoms upon moving in, including burning sensations in the eyes and throat. Despite replacing the HVAC system, which was at the end of its life, the patient continues to experience symptoms, albeit less severe.  Her husband is not really experiencing symptoms to the same extent that she is.  She has a 70-year-old child who is having some runny nose, but she is not treating it with anything.  [name] main concern is persistent redness and glossiness in the eyes, which appear scratched. She reports that the severity of these symptoms was such that she developed a rash on the eyelids, which has since improved with the use of steroid drops. The patient also reports feeling generally unwell in the chest and throat, particularly in the mornings, but notes that this has improved since the HVAC system was replaced.  The patient has sought advice from mold specialists, receiving conflicting advice about the presence of mold in the home. The recommendations range from doing nothing to installing crawlspace encapsulation and ventilation system.  She is trying to determine whether her symptoms are due to mold exposure or a new allergy before undertaking potentially costly home renovations.  The patient's  three-year-old son may also be experiencing some eye irritation, but this is not confirmed. The patient's husband does not appear to be affected. The patient has a dog, which has been in the family for some time, and she does not believe the dog is causing her symptoms. She is hoping that the dog can be put down soon.  Apparently he has become more aggressive as he has aged.  The patient has been managing her symptoms with Xyzal, an antihistamine, and has not required antibiotics. She has a history of seasonal allergies but reports that her current symptoms are more severe and different in nature. The patient is scheduled for allergy testing to help determine the cause of her symptoms.     Otherwise, there is no history of other atopic diseases, including asthma, food allergies, drug allergies, stinging insect allergies, or contact dermatitis. There is no significant infectious history. Vaccinations are up to date.    Past Medical History: Patient Active Problem List   Diagnosis Date Noted   Poor sleep 07/25/2022   Weight gain 07/25/2022   Pure hypercholesterolemia 03/20/2022   Generalized obesity with starting BMI 32 03/06/2022   Low back pain 03/06/2022   Vitamin D deficiency 03/06/2022   SOBOE (shortness of breath on exertion) 03/06/2022   Other fatigue 03/06/2022   Anxiety 07/31/2021   Gastroesophageal reflux disease without esophagitis 07/31/2021   Genital herpes simplex 07/31/2021   History of abnormal cervical Papanicolaou smear 07/31/2021   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   Subclinical hyperthyroidism 07/31/2021   Twin pregnancy 07/31/2021   Polycystic ovary syndrome 07/31/2021   Hashimoto's disease 05/21/2021   Thyroid antibody positive 01/24/2021   Vitiligo 01/24/2021   Pregnancy 02/29/2020   Vaginal tear resulting from childbirth 02/29/2020   Hyperandrogenemia 02/04/2012   PCOS (polycystic ovarian syndrome) 02/04/2012    Medication List:  Allergies as of 12/26/2022        Reactions   Banana Nausea And Vomiting   Spinach    Stomach cramps        Medication List        Accurate as of December 26, 2022  6:12 PM. If you have any questions, ask your nurse or doctor.          STOP taking these medications    ZYRTEC PO Stopped by: Alfonse Spruce       TAKE these medications    FISH OIL PO Take 1 capsule by mouth daily.   levocetirizine 5 MG tablet Commonly known as: XYZAL Take 5 mg by mouth every evening.   MAGNESIUM PO Take by mouth.   MULTIVITAMIN ADULT PO Take 1 tablet by mouth daily.   PRENATAL VITAMIN PO Take by mouth.   selenium 50 MCG Tabs tablet Take 50 mcg by mouth daily.   UNABLE TO FIND Med Name: Collagen Powder   VITAMIN D PO Take by mouth.        Birth History: non-contributory  Developmental History: non-contributory  Past Surgical History: Past Surgical History:  Procedure Laterality Date   NO PAST SURGERIES     PERINEAL LACERATION REPAIR N/A 03/01/2020   Procedure: SUTURE REPAIR PERINEAL LACERATION;  Surgeon: Candice Camp, MD;  Location: [facility] LD ORS;  Service: Gynecology;  Laterality: N/A;     Family History: Family History  Problem Relation Age of Onset   Allergic rhinitis Mother    Urticaria Mother    Asthma Mother    Thyroid disease Mother    Hypertension Mother    Cancer Mother        Cervical Cancer   Heart disease Mother    Depression Mother    Anxiety disorder Mother    Obesity Mother    Hypertension Father    Hepatitis C Father    Allergic rhinitis Brother    Asthma Brother    Allergic rhinitis Brother    Urticaria Brother    Allergic rhinitis Brother    Thyroid disease Maternal Aunt    Thyroid disease Maternal Grandmother    Stroke Neg Hx    Hyperlipidemia Neg Hx    Early death Neg Hx      Social History: [name] lives at home with her family.  She lives in a house that is built in [place].  There is hardwood throughout the home.  They have gas heating and central  cooling.  There is a dog inside of the home.  There are no dust mite covers on the bedding.  There is no tobacco exposure.  She currently works as an Acupuncturist for the past 5 years.  There is no fume, chemical, or dust exposure.  She does not have a HEPA filter.  She does not live in an interstate or industrial area. She just moved into a home in July 2024. There might some some mold in the home. They are not sure.  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   Review of systems otherwise negative other than that mentioned in the HPI.    Objective:   Blood pressure 114/74, pulse 78, temperature 98.3 F (36.8 C), temperature source Temporal, resp. rate 16, height 5' 3.75" (1.619 m), weight 186 lb 11.2 oz (84.7 kg), SpO2 96%, unknown if currently breastfeeding. Body mass index is 32.3 kg/m.     Physical Exam Vitals reviewed.  Constitutional:      Appearance: She is well-developed.  HENT:     Head: Normocephalic and atraumatic.     Right Ear: Tympanic membrane, ear canal and external ear normal. No drainage, swelling or tenderness. Tympanic membrane is not injected, scarred, erythematous, retracted or bulging.     Left Ear: Tympanic membrane, ear canal and external ear normal. No drainage, swelling or tenderness. Tympanic membrane is not injected, scarred, erythematous, retracted or bulging.     Nose: No nasal deformity, septal deviation, mucosal edema or rhinorrhea.     Right Sinus: No maxillary sinus tenderness or frontal sinus tenderness.     Left Sinus: No maxillary sinus tenderness or frontal sinus tenderness.     Mouth/Throat:     Mouth: Mucous membranes are not pale and not dry.     Pharynx: Uvula midline.  Eyes:     General:        Right eye: No discharge.        Left eye: No discharge.     Conjunctiva/sclera: Conjunctivae normal.     Right eye: Right conjunctiva is not injected. No chemosis.    Left eye: Left  conjunctiva is not injected. No chemosis.    Pupils: Pupils are equal, round, and reactive to light.  Cardiovascular:  Rate and Rhythm: Normal rate and regular rhythm.     Heart sounds: Normal heart sounds.  Pulmonary:     Effort: Pulmonary effort is normal. No tachypnea, accessory muscle usage or respiratory distress.     Breath sounds: Normal breath sounds. No wheezing, rhonchi or rales.  Chest:     Chest wall: No tenderness.  Abdominal:     Tenderness: There is no abdominal tenderness. There is no guarding or rebound.  Lymphadenopathy:     Head:     Right side of head: No submandibular, tonsillar or occipital adenopathy.     Left side of head: No submandibular, tonsillar or occipital adenopathy.     Cervical: No cervical adenopathy.  Skin:    Coloration: Skin is not pale.     Findings: No abrasion, erythema, petechiae or rash. Rash is not papular, urticarial or vesicular.  Neurological:     Mental Status: She is alert.  Psychiatric:        Behavior: Behavior is cooperative.      Diagnostic studies: deferred due to insurance stipulations that require a separate visit for testing           Malachi Bonds, MD [facility]

## 2022-12-26 NOTE — Patient Instructions (Addendum)
1. Chronic rhinitis - Because of insurance stipulations, we cannot do skin testing on the same day as your first visit. - We are all working to fight this, but for now we need to do two separate visits.  - We will know more after we do testing at the next visit.  - The skin testing visit can be squeezed in at your convenience.  - Then we can make a more full plan to address all of your symptoms. - Be sure to stop your antihistamines for 3 days before this appointment.   2. Return in about 1 week (around 01/02/2023). You can have the follow up appointment with Dr. Dellis Anes or a Nurse Practicioner (our Nurse Practitioners are excellent and always have Physician oversight!).    Please inform us of any Emergency Department visits, hospitalizations, or changes in symptoms. Call us before going to the ED for breathing or allergy symptoms since we might be able to fit you in for a sick visit. Feel free to contact us anytime with any questions, problems, or concerns.  It was a pleasure to meet you today!  Websites that have reliable patient information: 1. American Academy of Asthma, Allergy, and Immunology: www.aaaai.org 2. Food Allergy Research and Education (FARE): foodallergy.org 3. Mothers of Asthmatics: http://www.asthmacommunitynetwork.org 4. American College of Allergy, Asthma, and Immunology: www.acaai.org      "Like" [place] on Facebook and Instagram for our latest updates!      A healthy democracy works best when [employer] participate! Make sure you are registered to vote! If you have moved or changed any of your contact information, you will need to get this updated before voting! Scan the QR codes below to learn more!

## 2022-12-30 ENCOUNTER — Ambulatory Visit (INDEPENDENT_AMBULATORY_CARE_PROVIDER_SITE_OTHER): Payer: No Typology Code available for payment source | Admitting: Family Medicine

## 2022-12-30 ENCOUNTER — Encounter: Payer: Self-pay | Admitting: Family Medicine

## 2022-12-30 DIAGNOSIS — H1013 Acute atopic conjunctivitis, bilateral: Secondary | ICD-10-CM

## 2022-12-30 DIAGNOSIS — J3089 Other allergic rhinitis: Secondary | ICD-10-CM | POA: Diagnosis not present

## 2022-12-30 DIAGNOSIS — J302 Other seasonal allergic rhinitis: Secondary | ICD-10-CM | POA: Insufficient documentation

## 2022-12-30 DIAGNOSIS — H1010 Acute atopic conjunctivitis, unspecified eye: Secondary | ICD-10-CM | POA: Insufficient documentation

## 2022-12-30 NOTE — Progress Notes (Signed)
   [address] [place] [address] Dept: [phone]  FOLLOW UP NOTE  Patient ID: [name], female    DOB: 1986/01/08  Age: 37 y.o. MRN: [id number] Date of Office Visit: 12/30/2022  Assessment  Chief Complaint: Allergy Testing  HPI [name] is a 37 year old female who presents to the clinic for a follow-up visit for environmental allergy skin testing.  She was last seen in this clinic on 12/26/2022 by Dr. Dellis Anes as a new patient for evaluation of chronic rhinitis and chronic allergic conjunctivitis.    At today's visit, she reports she is feeling well overall with no cardiopulmonary, gastrointestinal, or integumentary symptoms.  She has not taken any antihistamine over the last 3 days.  She does report that she has a dog in her home that has been in residence for the last several years.  Her current medications are listed in the chart.  Drug Allergies:  Allergies  Allergen Reactions   Banana Nausea And Vomiting   Spinach     Stomach cramps    Physical Exam: There were no vitals taken for this visit.   Physical Exam  Diagnostics: Percutaneous environmental allergy panel was positive to grass pollen, weed pollen, tree pollen, mold, dust mite, cat, dog, and cockroach with adequate controls.  Intradermal allergy testing was positive to [place] grass pollen with an adequate control.  Assessment and Plan: 1. Seasonal and perennial allergic rhinitis   2. Seasonal allergic conjunctivitis     Patient Instructions  Allergic rhinitis Your environmental allergy skin testing was positive to grass pollen, weed pollen, tree pollen, mold, dust mite, cat, dog, and cockroach Allergen avoidance measures are listed below Continue an antihistamine once a day as needed for runny nose or itch. Remember to rotate to a different antihistamine about every 3 months. Some examples of over the counter antihistamines include Zyrtec (cetirizine), Xyzal (levocetirizine), Allegra  (fexofenadine), and Claritin (loratidine).  Continue Flonase 1 spray in each nostril once a day as needed for stuffy nose. In the right nostril, point the applicator out toward the right ear. In the left nostril, point the applicator out toward the left ear Consider saline nasal rinses as needed for nasal symptoms. Use this before any medicated nasal sprays for best result Consider allergen immunotherapy if your symptoms are not well-controlled with the treatment plan as listed above (written information provided for traditional and Rush therapy)  Allergic conjunctivitis Some over the counter eye drops include Pataday one drop in each eye once a day as needed for red, itchy eyes OR Zaditor one drop in each eye twice a day as needed for red itchy eyes. Avoid eye drops that say red eye relief as they may contain medications that dry out your eyes.   Call the clinic if this treatment plan is not working well for you.  Follow up in 3 months or sooner if needed.   Return in about 3 months (around 04/01/2023), or if symptoms worsen or fail to improve.    Thank you for the opportunity to care for this patient.  Please do not hesitate to contact me with questions.  Thermon Leyland, FNP [facility]

## 2022-12-30 NOTE — Patient Instructions (Addendum)
Allergic rhinitis Your environmental allergy skin testing was positive to grass pollen, weed pollen, tree pollen, mold, dust mite, cat, dog, and cockroach Allergen avoidance measures are listed below Continue an antihistamine once a day as needed for runny nose or itch. Remember to rotate to a different antihistamine about every 3 months. Some examples of over the counter antihistamines include Zyrtec (cetirizine), Xyzal (levocetirizine), Allegra (fexofenadine), and Claritin (loratidine).  Continue Flonase 1 spray in each nostril once a day as needed for stuffy nose. In the right nostril, point the applicator out toward the right ear. In the left nostril, point the applicator out toward the left ear Consider saline nasal rinses as needed for nasal symptoms. Use this before any medicated nasal sprays for best result Consider allergen immunotherapy if your symptoms are not well-controlled with the treatment plan as listed above (written information provided for traditional and Rush therapy)  Allergic conjunctivitis Some over the counter eye drops include Pataday one drop in each eye once a day as needed for red, itchy eyes OR Zaditor one drop in each eye twice a day as needed for red itchy eyes. Avoid eye drops that say red eye relief as they may contain medications that dry out your eyes.   Call the clinic if this treatment plan is not working well for you.  Follow up in 3 months or sooner if needed.  Reducing Pollen Exposure The American Academy of Allergy, Asthma and Immunology suggests the following steps to reduce your exposure to pollen during allergy seasons. Do not hang sheets or clothing out to dry; pollen may collect on these items. Do not mow lawns or spend time around freshly cut grass; mowing stirs up pollen. Keep windows closed at night.  Keep car windows closed while driving. Minimize morning activities outdoors, a time when pollen counts are usually at their highest. Stay indoors  as much as possible when pollen counts or humidity is high and on windy days when pollen tends to remain in the air longer. Use air conditioning when possible.  Many air conditioners have filters that trap the pollen spores. Use a HEPA room air filter to remove pollen form the indoor air you breathe.  Control of Mold Allergen Mold and fungi can grow on a variety of surfaces provided certain temperature and moisture conditions exist.  Outdoor molds grow on plants, decaying vegetation and soil.  The major outdoor mold, Alternaria and Cladosporium, are found in very high numbers during hot and dry conditions.  Generally, a late Summer - Fall peak is seen for common outdoor fungal spores.  Rain will temporarily lower outdoor mold spore count, but counts rise rapidly when the rainy period ends.  The most important indoor molds are Aspergillus and Penicillium.  Dark, humid and poorly ventilated basements are ideal sites for mold growth.  The next most common sites of mold growth are the bathroom and the kitchen.  Outdoor [employer] Use air conditioning and keep windows closed Avoid exposure to decaying vegetation. Avoid leaf raking. Avoid grain handling. Consider wearing a face mask if working in moldy areas.  Indoor Mold Control Maintain humidity below 50%. Clean washable surfaces with 5% bleach solution. Remove sources e.g. Contaminated carpets.   Control of Dust Mite Allergen Dust mites play a major role in allergic asthma and rhinitis. They occur in environments with high humidity wherever human skin is found. Dust mites absorb humidity from the atmosphere (ie, they do not drink) and feed on organic matter (including shed human  and animal skin). Dust mites are a microscopic type of insect that you cannot see with the naked eye. High levels of dust mites have been detected from mattresses, pillows, carpets, upholstered furniture, bed covers, clothes, soft toys and any woven material. The  principal allergen of the dust mite is found in its feces. A gram of dust may contain 1,000 mites and 250,000 fecal particles. Mite antigen is easily measured in the air during house cleaning activities. Dust mites do not bite and do not cause harm to humans, other than by triggering allergies/asthma.  Ways to decrease your exposure to dust mites in your home:  1. Encase mattresses, box springs and pillows with a mite-impermeable barrier or cover  2. Wash sheets, blankets and drapes weekly in hot water (130 F) with detergent and dry them in a dryer on the hot setting.  3. Have the room cleaned frequently with a vacuum cleaner and a damp dust-mop. For carpeting or rugs, vacuuming with a vacuum cleaner equipped with a high-efficiency particulate air (HEPA) filter. The dust mite allergic individual should not be in a room which is being cleaned and should wait 1 hour after cleaning before going into the room.  4. Do not sleep on upholstered furniture (eg, couches).  5. If possible removing carpeting, upholstered furniture and drapery from the home is ideal. Horizontal blinds should be eliminated in the rooms where the person spends the most time (bedroom, study, television room). Washable vinyl, roller-type shades are optimal.  6. Remove all non-washable stuffed toys from the bedroom. Wash stuffed toys weekly like sheets and blankets above.  7. Reduce indoor humidity to less than 50%. Inexpensive humidity monitors can be purchased at most hardware stores. Do not use a humidifier as can make the problem worse and are not recommended.  Control of Dog or Cat Allergen Avoidance is the best way to manage a dog or cat allergy. If you have a dog or cat and are allergic to dog or cats, consider removing the dog or cat from the home. If you have a dog or cat but don't want to find it a new home, or if your family wants a pet even though someone in the household is allergic, here are some strategies that may  help keep symptoms at bay:  Keep the pet out of your bedroom and restrict it to only a few rooms. Be advised that keeping the dog or cat in only one room will not limit the allergens to that room. Don't pet, hug or kiss the dog or cat; if you do, wash your hands with soap and water. High-efficiency particulate air (HEPA) cleaners run continuously in a bedroom or living room can reduce allergen levels over time. Regular use of a high-efficiency vacuum cleaner or a central vacuum can reduce allergen levels. Giving your dog or cat a bath at least once a week can reduce airborne allergen.  Control of Cockroach Allergen Cockroach allergen has been identified as an important cause of acute attacks of asthma, especially in urban settings.  There are fifty-five species of cockroach that exist in the [place], however only three, the [place], [place] species produce allergen that can affect patients with Asthma.  Allergens can be obtained from fecal particles, egg casings and secretions from cockroaches.    Remove food sources. Reduce access to water. Seal access and entry points. Spray runways with 0.5-1% Diazinon or Chlorpyrifos Blow boric acid power under stoves and refrigerator. Place bait stations (hydramethylnon)  at feeding sites.

## 2023-02-11 ENCOUNTER — Encounter: Payer: Self-pay | Admitting: Orthopedic Surgery

## 2023-02-11 ENCOUNTER — Other Ambulatory Visit: Payer: Self-pay | Admitting: Orthopedic Surgery

## 2023-02-11 DIAGNOSIS — E063 Autoimmune thyroiditis: Secondary | ICD-10-CM

## 2023-02-11 DIAGNOSIS — E7800 Pure hypercholesterolemia, unspecified: Secondary | ICD-10-CM

## 2023-02-11 NOTE — Telephone Encounter (Signed)
 Orders released for pending appointment

## 2023-02-19 ENCOUNTER — Other Ambulatory Visit: Payer: No Typology Code available for payment source

## 2023-02-20 ENCOUNTER — Encounter: Payer: Self-pay | Admitting: Orthopedic Surgery

## 2023-02-20 LAB — THYROID PEROXIDASE ANTIBODY: Thyroperoxidase Ab SerPl-aCnc: 367 [IU]/mL — ABNORMAL HIGH (ref <9)

## 2023-02-20 LAB — LIPID PANEL
Cholesterol: 214 mg/dL — ABNORMAL HIGH (ref <200)
HDL: 55 mg/dL (ref >=50)
LDL Cholesterol (Calc): 139 mg/dL — ABNORMAL HIGH
Non-HDL Cholesterol (Calc): 159 mg/dL — ABNORMAL HIGH (ref <130)
Total CHOL/HDL Ratio: 3.9 (calc) (ref <5.0)
Triglycerides: 92 mg/dL (ref <150)

## 2023-02-20 LAB — TSH: TSH: 2.01 m[IU]/L

## 2023-02-20 LAB — T4, FREE: Free T4: 0.9 ng/dL (ref 0.8–1.8)

## 2023-02-26 ENCOUNTER — Ambulatory Visit: Payer: PRIVATE HEALTH INSURANCE

## 2023-02-26 ENCOUNTER — Ambulatory Visit: Payer: No Typology Code available for payment source | Admitting: Cardiovascular Disease

## 2023-02-26 DIAGNOSIS — Z79899 Other long term (current) drug therapy: Secondary | ICD-10-CM

## 2023-02-26 DIAGNOSIS — E782 Mixed hyperlipidemia: Secondary | ICD-10-CM

## 2023-02-27 ENCOUNTER — Other Ambulatory Visit: Payer: Self-pay

## 2023-02-27 ENCOUNTER — Emergency Department (HOSPITAL_COMMUNITY): Payer: PRIVATE HEALTH INSURANCE

## 2023-02-27 ENCOUNTER — Ambulatory Visit (HOSPITAL_BASED_OUTPATIENT_CLINIC_OR_DEPARTMENT_OTHER): Payer: PRIVATE HEALTH INSURANCE | Admitting: Student

## 2023-02-27 ENCOUNTER — Emergency Department (HOSPITAL_COMMUNITY)
Admission: EM | Admit: 2023-02-27 | Discharge: 2023-02-27 | Disposition: A | Discharge status: Home / Self Care | Payer: PRIVATE HEALTH INSURANCE | Attending: Emergency Medicine | Admitting: Emergency Medicine

## 2023-02-27 DIAGNOSIS — M545 Low back pain, unspecified: Secondary | ICD-10-CM | POA: Diagnosis present

## 2023-02-27 DIAGNOSIS — R202 Paresthesia of skin: Secondary | ICD-10-CM | POA: Insufficient documentation

## 2023-02-27 DIAGNOSIS — M5431 Sciatica, right side: Secondary | ICD-10-CM

## 2023-02-27 DIAGNOSIS — M5442 Lumbago with sciatica, left side: Secondary | ICD-10-CM | POA: Diagnosis not present

## 2023-02-27 LAB — URINALYSIS, W/ REFLEX TO CULTURE (INFECTION SUSPECTED)
Bacteria, UA: NONE SEEN
Bilirubin Urine: NEGATIVE
Glucose, UA: NEGATIVE mg/dL
Hgb urine dipstick: NEGATIVE
Ketones, ur: NEGATIVE mg/dL
Leukocytes,Ua: NEGATIVE
Nitrite: NEGATIVE
Protein, ur: NEGATIVE mg/dL
Specific Gravity, Urine: 1.018 (ref 1.005–1.030)
pH: 5 (ref 5.0–8.0)

## 2023-02-27 LAB — COMPREHENSIVE METABOLIC PANEL
ALT: 13 U/L (ref 0–44)
AST: 17 U/L (ref 15–41)
Albumin: 3.8 g/dL (ref 3.5–5.0)
Alkaline Phosphatase: 38 U/L (ref 38–126)
Anion gap: 10 (ref 5–15)
BUN: 14 mg/dL (ref 6–20)
CO2: 22 mmol/L (ref 22–32)
Calcium: 9 mg/dL (ref 8.9–10.3)
Chloride: 106 mmol/L (ref 98–111)
Creatinine, Ser: 0.77 mg/dL (ref 0.44–1.00)
GFR, Estimated: >60 mL/min (ref >60)
Glucose, Bld: 102 mg/dL — ABNORMAL HIGH (ref 70–99)
Potassium: 3.9 mmol/L (ref 3.5–5.1)
Sodium: 138 mmol/L (ref 135–145)
Total Bilirubin: 0.7 mg/dL (ref 0.0–1.2)
Total Protein: 7 g/dL (ref 6.5–8.1)

## 2023-02-27 LAB — CBC WITH DIFFERENTIAL/PLATELET
Abs Immature Granulocytes: 0.02 10*3/uL (ref 0.00–0.07)
Basophils Absolute: 0 10*3/uL (ref 0.0–0.1)
Basophils Relative: 0 %
Eosinophils Absolute: 0.1 10*3/uL (ref 0.0–0.5)
Eosinophils Relative: 1 %
HCT: 39.8 % (ref 36.0–46.0)
Hemoglobin: 14 g/dL (ref 12.0–15.0)
Immature Granulocytes: 0 %
Lymphocytes Relative: 24 %
Lymphs Abs: 1.9 10*3/uL (ref 0.7–4.0)
MCH: 32 pg (ref 26.0–34.0)
MCHC: 35.2 g/dL (ref 30.0–36.0)
MCV: 90.9 fL (ref 80.0–100.0)
Monocytes Absolute: 0.7 10*3/uL (ref 0.1–1.0)
Monocytes Relative: 8 %
Neutro Abs: 5.1 10*3/uL (ref 1.7–7.7)
Neutrophils Relative %: 67 %
Platelets: 238 10*3/uL (ref 150–400)
RBC: 4.38 MIL/uL (ref 3.87–5.11)
RDW: 11.8 % (ref 11.5–15.5)
WBC: 7.7 10*3/uL (ref 4.0–10.5)
nRBC: 0 % (ref 0.0–0.2)

## 2023-02-27 LAB — HCG, QUANTITATIVE, PREGNANCY: hCG, Beta Chain, Quant, S: <1 m[IU]/mL (ref <5)

## 2023-02-27 MED ORDER — METHOCARBAMOL 500 MG PO TABS
500.0000 mg | ORAL_TABLET | Freq: Three times a day (TID) | ORAL | 0 refills | Status: DC | PRN
Start: 2023-02-27 — End: 2023-03-13

## 2023-02-27 MED ORDER — GADOBUTROL 1 MMOL/ML IV SOLN
8.0000 mL | Freq: Once | INTRAVENOUS | Status: AC | PRN
  Administered 2023-02-27: 8 mL via INTRAVENOUS

## 2023-02-27 MED ORDER — SODIUM CHLORIDE 0.9 % IV BOLUS
500.0000 mL | Freq: Once | INTRAVENOUS | Status: AC
  Administered 2023-02-27: 500 mL via INTRAVENOUS

## 2023-02-27 MED ORDER — IBUPROFEN 800 MG PO TABS: 800.0000 mg | ORAL_TABLET | Freq: Three times a day (TID) | ORAL | 0 refills | Status: DC | PRN

## 2023-02-27 MED ORDER — MORPHINE SULFATE (PF) 4 MG/ML IV SOLN
4.0000 mg | Freq: Once | INTRAVENOUS | Status: DC
Start: 2023-02-27 — End: 2023-02-27
  Filled 2023-02-27: qty 1

## 2023-02-27 MED ORDER — PREDNISONE 20 MG PO TABS
60.0000 mg | ORAL_TABLET | Freq: Once | ORAL | Status: AC
  Administered 2023-02-27: 60 mg via ORAL
  Filled 2023-02-27: qty 3

## 2023-02-27 MED ORDER — PREDNISONE 10 MG (21) PO TBPK
ORAL_TABLET | Freq: Every day | ORAL | 0 refills | Status: DC
Start: 2023-02-28 — End: 2023-03-13

## 2023-02-27 MED ORDER — ONDANSETRON HCL 4 MG/2ML IJ SOLN
4.0000 mg | Freq: Once | INTRAMUSCULAR | Status: AC
  Administered 2023-02-27: 4 mg via INTRAVENOUS
  Filled 2023-02-27: qty 2

## 2023-02-27 NOTE — ED Provider Notes (Signed)
Emergency Department Provider Note   I have reviewed the triage vital signs and the nursing notes.   HISTORY  Chief Complaint rt. leg numbness and Back Pain   HPI [name] is a 38 y.o. female with past history reviewed below presents to the emergency department with acute onset right leg numbness now spreading to the groin with some right lower back pain.  Symptoms began yesterday without trauma.  No fevers or chills.  No IV drug use.  She works as an Acupuncturist.  Today, she noticed some numbness developing on the left inner thigh as well and urinates without difficulty but does not feel the urine as it is passing.  No retention symptoms.  No loss of bowel control.  She notes a history of chronic low back pain but has only ever required x-rays of her lower back.  No prior surgeries or MRIs.  She is not established with a spine surgeon.   Past Medical History:  Diagnosis Date   Anemia    Anxiety    Back pain    Constipation    Hashimoto's disease    History of chicken pox    HSV infection    Joint pain    Lactose intolerance    Lower back pain    Palpitations    PCOS (polycystic ovarian syndrome)    PCOS (polycystic ovarian syndrome)    Right hip pain    SOBOE (shortness of breath on exertion)    Thyroid condition    Velamentous insertion of umbilical cord    Vitamin D deficiency    Vitiligo     Review of Systems  Constitutional: No fever/chills Eyes: No visual changes. ENT: No sore throat. Cardiovascular: Denies chest pain. Respiratory: Denies shortness of breath. Gastrointestinal: No abdominal pain.  No nausea, no vomiting.  No diarrhea.  No constipation. Genitourinary: Negative for dysuria. Musculoskeletal: Positive back pain.  Skin: Negative for rash. Neurological: Negative for headaches. Positive right leg numbness with some symptoms spreading in to the left inner thigh.    ____________________________________________   PHYSICAL  EXAM:  VITAL SIGNS: ED Triage Vitals  Encounter Vitals Group     BP 02/27/23 0956 131/77     Pulse Rate 02/27/23 0956 80     Resp 02/27/23 0956 16     Temp 02/27/23 0956 98.7 F (37.1 C)     Temp src --      SpO2 02/27/23 0956 98 %     Weight 02/27/23 1011 185 lb (83.9 kg)     Height 02/27/23 1011 5\' 3"  (1.6 m)   Constitutional: Alert and oriented. Well appearing and in no acute distress. Eyes: Conjunctivae are normal. Head: Atraumatic. Nose: No congestion/rhinnorhea. Mouth/Throat: Mucous membranes are moist.   Neck: No stridor.  Cardiovascular: Normal rate, regular rhythm. Good peripheral circulation. Grossly normal heart sounds.   Respiratory: Normal respiratory effort.  No retractions. Lungs CTAB. Gastrointestinal: Soft and nontender. No distention.  Musculoskeletal: No lower extremity tenderness nor edema. No gross deformities of extremities. Neurologic:  Normal speech and language. 2+ patellar reflexes bilaterally. Decreased sensation diffusely throughout the RLE compared to the LLE. Slight sensory decrease to the left inner thigh. Grossly normal strength in the bilateral LEs.  Skin:  Skin is warm, dry and intact. No rash noted.  ____________________________________________   LABS (all labs ordered are listed, but only abnormal results are displayed)  Labs Reviewed  COMPREHENSIVE METABOLIC PANEL - Abnormal; Notable for the following components:  Result Value   Glucose, Bld 102 (*)    All other components within normal limits  CBC WITH DIFFERENTIAL/PLATELET  HCG, QUANTITATIVE, PREGNANCY  URINALYSIS, W/ REFLEX TO CULTURE (INFECTION SUSPECTED)   ____________________________________________  RADIOLOGY  MR THORACIC SPINE W WO CONTRAST Result Date: 02/27/2023 CLINICAL DATA:  Mid-back pain, neuro deficit EXAM: MRI THORACIC AND LUMBAR SPINE WITHOUT AND WITH CONTRAST TECHNIQUE: Multiplanar and multiecho pulse sequences of the thoracic and lumbar spine were obtained  without and with intravenous contrast. CONTRAST:  8mL GADAVIST GADOBUTROL 1 MMOL/ML IV SOLN COMPARISON:  None Available. FINDINGS: MRI THORACIC SPINE FINDINGS Alignment:  Physiologic. Vertebrae: No fracture, evidence of discitis, or bone lesion. Cord:  Normal signal and morphology. Paraspinal and other soft tissues: Negative. Disc levels: There are multilevel small degenerative disc bulges without evidence of high-grade spinal canal stenosis. No evidence of high-grade neural foraminal stenosis. MRI LUMBAR SPINE FINDINGS Segmentation:  Standard. Alignment:  Physiologic. Vertebrae:  No fracture, evidence of discitis, or bone lesion. Conus medullaris: Extends to the L1 level and appears normal. Paraspinal and other soft tissues: Negative. Disc levels: T12-L1: Unremarkable L1-L2: Unremarkable L2-L3: Unremarkable L3-L4: Mild bilateral facet degenerative change. No significant disc bulge. No spinal canal narrowing. Mild bilateral neural foraminal narrowing. L4-L5: Mild bilateral facet degenerative change no significant disc bulge. Mild spinal canal narrowing. Mild bilateral neural foraminal narrowing. L5-S1: Eccentric left disc bulge with a superimposed left paracentral disc protrusion that narrows the left lateral recess. Mild overall spinal canal narrowing. Mild-to-moderate left and mild right neural foraminal narrowing. IMPRESSION: 1. No evidence of demyelinating disease in the thoracic spine. 2. No evidence of high-grade spinal canal or neural foraminal stenosis. 3. Eccentric left disc bulge with a superimposed left paracentral disc protrusion at L5-S1 that narrows the left lateral recess and could affect the descending left S1 nerve root. Electronically Signed   By: Lorenza Cambridge M.D.   On: 02/27/2023 14:37   MR Lumbar Spine W Wo Contrast Result Date: 02/27/2023 CLINICAL DATA:  Mid-back pain, neuro deficit EXAM: MRI THORACIC AND LUMBAR SPINE WITHOUT AND WITH CONTRAST TECHNIQUE: Multiplanar and multiecho pulse  sequences of the thoracic and lumbar spine were obtained without and with intravenous contrast. CONTRAST:  8mL GADAVIST GADOBUTROL 1 MMOL/ML IV SOLN COMPARISON:  None Available. FINDINGS: MRI THORACIC SPINE FINDINGS Alignment:  Physiologic. Vertebrae: No fracture, evidence of discitis, or bone lesion. Cord:  Normal signal and morphology. Paraspinal and other soft tissues: Negative. Disc levels: There are multilevel small degenerative disc bulges without evidence of high-grade spinal canal stenosis. No evidence of high-grade neural foraminal stenosis. MRI LUMBAR SPINE FINDINGS Segmentation:  Standard. Alignment:  Physiologic. Vertebrae:  No fracture, evidence of discitis, or bone lesion. Conus medullaris: Extends to the L1 level and appears normal. Paraspinal and other soft tissues: Negative. Disc levels: T12-L1: Unremarkable L1-L2: Unremarkable L2-L3: Unremarkable L3-L4: Mild bilateral facet degenerative change. No significant disc bulge. No spinal canal narrowing. Mild bilateral neural foraminal narrowing. L4-L5: Mild bilateral facet degenerative change no significant disc bulge. Mild spinal canal narrowing. Mild bilateral neural foraminal narrowing. L5-S1: Eccentric left disc bulge with a superimposed left paracentral disc protrusion that narrows the left lateral recess. Mild overall spinal canal narrowing. Mild-to-moderate left and mild right neural foraminal narrowing. IMPRESSION: 1. No evidence of demyelinating disease in the thoracic spine. 2. No evidence of high-grade spinal canal or neural foraminal stenosis. 3. Eccentric left disc bulge with a superimposed left paracentral disc protrusion at L5-S1 that narrows the left lateral recess and could affect the  descending left S1 nerve root. Electronically Signed   By: Lorenza Cambridge M.D.   On: 02/27/2023 14:37    ____________________________________________   PROCEDURES  Procedure(s) performed:    Procedures   ____________________________________________   INITIAL IMPRESSION / ASSESSMENT AND PLAN / ED COURSE  Pertinent labs & imaging results that were available during my care of the patient were reviewed by me and considered in my medical decision making (see chart for details).   This patient is Presenting for Evaluation of back pain, which does require a range of treatment options, and is a complaint that involves a high risk of morbidity and mortality.  The Differential Diagnoses includes but is not exclusive to musculoskeletal back pain, renal colic, urinary tract infection, pyelonephritis, intra-abdominal causes of back pain, aortic aneurysm or dissection, cauda equina syndrome, sciatica, lumbar disc disease, thoracic disc disease, etc.   Critical Interventions-    Medications  sodium chloride 0.9 % bolus 500 mL (0 mLs Intravenous Stopped 02/27/23 1619)  ondansetron (ZOFRAN) injection 4 mg (4 mg Intravenous Given 02/27/23 1430)  gadobutrol (GADAVIST) 1 MMOL/ML injection 8 mL (8 mLs Intravenous Contrast Given 02/27/23 1408)  predniSONE (DELTASONE) tablet 60 mg (60 mg Oral Given 02/27/23 1624)    Reassessment after intervention: pain improved.    I did obtain Additional Historical Information from friend at bedside.  I decided to review pertinent External Data, and in summary has seen Dr. Steward Drone for right lower back and SI pain in March 2023. No appointments since. PT advised. No SI injection at that time (patient deferred).    Clinical Laboratory Tests Ordered, included CBC without leukocytosis. No AKI. Normal electrolytes.   Radiologic Tests Ordered, included MRI thoracic and lumbar spine. I independently interpreted the images and agree with radiology interpretation.   Cardiac Monitor Tracing which shows NSR.   Social Determinants of Health Risk patient is a non-smoker.   Medical Decision Making: Summary:  Patient presents emergency department with low back pain  and numbness in the right leg.  Now has some numbness to the left inner thigh and groin.  MRI was ordered from triage to evaluate further for possible central cord process or cauda equina.   MRI without cauda equina or other cord lesions concerning for MS vs transverse myelitis. Very low suspicion for CVA given associated sciatica symptoms and pain. Will d/c home with steroid dose pack and pain mgmt.   Reevaluation with update and discussion with patient. Plan for steroid dose pack, pain mgmt, and plan for Neuro follow up should symptoms recur.   Patient's presentation is most consistent with acute presentation with potential threat to life or bodily function.   Disposition: discharge  ____________________________________________  FINAL CLINICAL IMPRESSION(S) / ED DIAGNOSES  Final diagnoses:  Sciatica of right side  Paresthesias     NEW OUTPATIENT MEDICATIONS STARTED DURING THIS VISIT:  Discharge Medication List as of 02/27/2023  4:19 PM     START taking these medications   Details  ibuprofen (ADVIL) 800 MG tablet Take 1 tablet (800 mg total) by mouth every 8 (eight) hours as needed., Starting Thu 02/27/2023, Normal    methocarbamol (ROBAXIN) 500 MG tablet Take 1 tablet (500 mg total) by mouth every 8 (eight) hours as needed., Starting Thu 02/27/2023, Normal    predniSONE (STERAPRED UNI-PAK 21 TAB) 10 MG (21) TBPK tablet Take by mouth daily. Take 6 tabs by mouth daily  for 2 days, then 5 tabs for 2 days, then 4 tabs for 2 days, then 3  tabs for 2 days, 2 tabs for 2 days, then 1 tab by mouth daily for 2 days, Starting Fri 02/28/2023, Normal        Note:  This document was prepared using Dragon voice recognition software and may include unintentional dictation errors.  Alona Bene, MD, [facility] Emergency Medicine    Gaylen Venning, Arlyss Repress, MD 02/28/23 [phone]

## 2023-02-27 NOTE — ED Triage Notes (Signed)
Pt. Stated, I started having numbness on the right side even into my private area, and my back is really hurting on the right side., started yesterday

## 2023-02-27 NOTE — ED Provider Triage Note (Signed)
Emergency Medicine Provider Triage Evaluation Note  [name] , a 38 y.o. female  was evaluated in triage.  Pt complains of severe pain in his mid and low back with associated right lower torso symptoms and right leg symptoms.  Review of Systems  Positive: Pain in mid and low back worse on the right side.  Numbness in right low back, right abdomen, groin, rectal area, and numbness and weakness in right leg occultly with ambulation Negative: Fevers, chills, congestion, cough, nausea, vomiting, constipation, diarrhea, urinary changes.  Hematuria.  No history of kidney stones.  No trauma.  No rashes due to shingles.  No headache or neck pain.  Physical Exam  BP 131/77 (BP Location: Right Arm)   Pulse 80   Temp 98.7 F (37.1 C)   Resp 16   Ht 5\' 3"  (1.6 m)   Wt 83.9 kg   LMP 01/25/2023   SpO2 98%   BMI 32.77 kg/m  Gen:   Awake, no distress   Resp:  Normal effort , clear breath sounds bilaterally MSK:   Numbness in right leg compared to left.  Weakness in right leg compared to the left.  Reflex intact however.  Numbness in right back and right abdomen.  No CVA tenderness.  No rashes just shingles Other:  Patient reported numbness in her rectal area and her right vaginal area.  Medical Decision Making  Medically screening exam initiated at 11:29 AM.  Appropriate orders placed.  [name] was informed that the remainder of the evaluation will be completed by another provider, this initial triage assessment does not replace that evaluation, and the importance of remaining in the ED until their evaluation is complete.  [name] is a 38 y.o. female with a past medical history significant for anxiety, PCOS, Hashimoto's, vitiligo, and hypercholesterolemia who presents with severe pain in the back as well as neurologic problem in her leg.  According to patient, patient will get normal yesterday but since then has had worsening pain in her mid and low back worse on the right side.  She  denies history of kidney stones and no urinary changes.  She reports she has noticed numbness developing and worsening in the right leg.  It feels numb and weak and she is of difficulty walking and moving it.  She also noticed numbness in her right rectal area in her right vaginal area.  She has numbness in her right lower abdomen and right low back.  This is all new.  She had no trauma and denies any infectious symptoms.  Does not use IV drugs.  Denies any fevers, chills, or cough.  No history of kidney stones.  No vaginal complaints otherwise.  Patient reports she has had some back pain on and off over the years and had x-rays but has not any imaging recently.  She is concerned that she may have some autoimmune difficulties that are still being worked up.  No family history of MS reported.  On exam, lungs clear.  Chest nontender.  Abdomen nontender.  Flanks nontender with no CVA tenderness.  No rashes or shingles.  She did have some tenderness in her mid and low back.  No laceration or skin changes seen.  Patient did not have focal tenderness in the leg but had numbness in the entire right leg.  She also had weakness.  Reflex was intact however.  Left side normal on exam.  Arm exam normal with no Hoffmann sign.  Pupil symmetric and reactive with  normal extract movements.  Symmetric smile.  Clear speech.  Based on her symptoms I do feel she needs imaging to rule out MS or a cauda equina type picture given the neurodeficit she is having in the leg and lower torso.  She is not having any headache or neck pain and had no arm symptoms, doubt intra cervical or intracranial problem.  Will hold on CT or MR imaging of the head or neck but will get imaging of her T and L-spine with MRI with and without contrast.  Will get screening labs.  Will get urinalysis.  Have less suspicion for a kidney stone given her lack of tenderness in the right CVA and right abdomen.  Will hold on CT imaging of the abdomen at this  time.  Anticipate assessment when she gets to an exam room and disposition based on findings.      Joaquin Knebel, Canary Brim, MD 02/27/23 1144

## 2023-02-27 NOTE — ED Notes (Signed)
Pt to MRI

## 2023-02-27 NOTE — Discharge Instructions (Signed)
You were seen in the ED today with back pain and numbness. I suspect that you are suffering from sciatica. I am starting you on steroids and will have you follow with your PCP in the coming week. I have listed the name for a neurologist to call if your numbness continues.   If you develop any sudden worsening in symptoms such as arm/face symptoms, fever, worsening pain, you should return to the ED for re-evaluation.   Robaxin is a muscle relaxer and can make you drowsy. Do not take this while driving.

## 2023-02-28 ENCOUNTER — Telehealth: Payer: Self-pay

## 2023-02-28 NOTE — Transitions of Care (Post Inpatient/ED Visit) (Signed)
   02/28/2023  Name: [name] MRN: [id number] DOB: [birth year]  Today's TOC FU Call Status:    Attempted to reach the patient regarding the most recent Inpatient/ED visit.  Follow Up Plan: Additional outreach attempts will be made to reach the patient to complete the Transitions of Care (Post Inpatient/ED visit) call.   Signature : Charlee Whitebread.D/RMA

## 2023-03-01 ENCOUNTER — Emergency Department (HOSPITAL_COMMUNITY)
Admission: EM | Admit: 2023-03-01 | Discharge: 2023-03-02 | Disposition: A | Discharge status: Home / Self Care | Payer: PRIVATE HEALTH INSURANCE | Attending: Emergency Medicine | Admitting: Emergency Medicine

## 2023-03-01 ENCOUNTER — Encounter (HOSPITAL_COMMUNITY): Payer: Self-pay | Admitting: *Deleted

## 2023-03-01 ENCOUNTER — Other Ambulatory Visit: Payer: Self-pay

## 2023-03-01 DIAGNOSIS — R531 Weakness: Secondary | ICD-10-CM | POA: Insufficient documentation

## 2023-03-01 DIAGNOSIS — R109 Unspecified abdominal pain: Secondary | ICD-10-CM | POA: Diagnosis not present

## 2023-03-01 DIAGNOSIS — R200 Anesthesia of skin: Secondary | ICD-10-CM | POA: Insufficient documentation

## 2023-03-01 LAB — URINALYSIS, ROUTINE W REFLEX MICROSCOPIC
Bilirubin Urine: NEGATIVE
Glucose, UA: NEGATIVE mg/dL
Ketones, ur: NEGATIVE mg/dL
Leukocytes,Ua: NEGATIVE
Nitrite: NEGATIVE
Protein, ur: NEGATIVE mg/dL
RBC / HPF: >50 RBC/hpf (ref 0–5)
Specific Gravity, Urine: 1.015 (ref 1.005–1.030)
pH: 6 (ref 5.0–8.0)

## 2023-03-01 LAB — CBC WITH DIFFERENTIAL/PLATELET
Abs Immature Granulocytes: 0.05 10*3/uL (ref 0.00–0.07)
Basophils Absolute: 0 10*3/uL (ref 0.0–0.1)
Basophils Relative: 0 %
Eosinophils Absolute: 0 10*3/uL (ref 0.0–0.5)
Eosinophils Relative: 0 %
HCT: 39.6 % (ref 36.0–46.0)
Hemoglobin: 13.6 g/dL (ref 12.0–15.0)
Immature Granulocytes: 1 %
Lymphocytes Relative: 12 %
Lymphs Abs: 1.3 10*3/uL (ref 0.7–4.0)
MCH: 31.3 pg (ref 26.0–34.0)
MCHC: 34.3 g/dL (ref 30.0–36.0)
MCV: 91.2 fL (ref 80.0–100.0)
Monocytes Absolute: 0.5 10*3/uL (ref 0.1–1.0)
Monocytes Relative: 4 %
Neutro Abs: 9.3 10*3/uL — ABNORMAL HIGH (ref 1.7–7.7)
Neutrophils Relative %: 83 %
Platelets: 278 10*3/uL (ref 150–400)
RBC: 4.34 MIL/uL (ref 3.87–5.11)
RDW: 12 % (ref 11.5–15.5)
WBC: 11.1 10*3/uL — ABNORMAL HIGH (ref 4.0–10.5)
nRBC: 0 % (ref 0.0–0.2)

## 2023-03-01 LAB — COMPREHENSIVE METABOLIC PANEL
ALT: 13 U/L (ref 0–44)
AST: 17 U/L (ref 15–41)
Albumin: 4 g/dL (ref 3.5–5.0)
Alkaline Phosphatase: 38 U/L (ref 38–126)
Anion gap: 9 (ref 5–15)
BUN: 14 mg/dL (ref 6–20)
CO2: 24 mmol/L (ref 22–32)
Calcium: 9.1 mg/dL (ref 8.9–10.3)
Chloride: 106 mmol/L (ref 98–111)
Creatinine, Ser: 0.65 mg/dL (ref 0.44–1.00)
GFR, Estimated: >60 mL/min (ref >60)
Glucose, Bld: 117 mg/dL — ABNORMAL HIGH (ref 70–99)
Potassium: 4.8 mmol/L (ref 3.5–5.1)
Sodium: 139 mmol/L (ref 135–145)
Total Bilirubin: 0.5 mg/dL (ref 0.0–1.2)
Total Protein: 7.4 g/dL (ref 6.5–8.1)

## 2023-03-01 NOTE — ED Provider Triage Note (Signed)
Emergency Medicine Provider Triage Evaluation Note  [name] , a 38 y.o. female  was evaluated in triage.  Pt complains of bilateral lower extremity weakness and numbness. Was previously seen on 02/27/2023 and d/c with robaxin, ibuprofen, prednisone. Began with R leg numbness and progressed to bilateral LE numbness and pelvic numbness. Pt is concerned for GB. Recently had a cold 3 weeks ago. Denies recent travel, bug bites. LMP 02/27/23  Previous Hx of hashimoto's and vitiligo.   Endorses R sided flank pain. Denies fever, chest pain, abdominal pain, n/v/d, dysuria, incontinence, leg swelling, rashes.    Review of Systems  Positive: See above Negative: See above  Physical Exam  BP 134/86 (BP Location: Right Arm)   Pulse 79   Temp 98.2 F (36.8 C)   Resp 16   Ht 5\' 5"  (1.651 m)   Wt 83.9 kg   LMP 02/27/2023   SpO2 97%   BMI 30.78 kg/m  Gen:   Awake, no distress   Resp:  Normal effort  MSK:   Upper and RLE grossly intact but has struggles to have LLE extension.  Other:    Medical Decision Making  Medically screening exam initiated at 6:15 PM.  Appropriate orders placed.  Zaylia Riolo was informed that the remainder of the evaluation will be completed by another provider, this initial triage assessment does not replace that evaluation, and the importance of remaining in the ED until their evaluation is complete.     Lunette Stands, [place] 03/01/23 1824

## 2023-03-01 NOTE — ED Triage Notes (Signed)
The pt has had pain numbness in rt leg on Wednesday and she was seen here now she has numbness an pain in her lt leg also and she feels like that sensation is moving up into her waist area  she already has an auto immune disorder.    Lmp Thursday past

## 2023-03-02 ENCOUNTER — Emergency Department (HOSPITAL_COMMUNITY): Payer: PRIVATE HEALTH INSURANCE

## 2023-03-02 LAB — PREGNANCY, URINE: Preg Test, Ur: NEGATIVE

## 2023-03-02 MED ORDER — GADOBUTROL 1 MMOL/ML IV SOLN
8.0000 mL | Freq: Once | INTRAVENOUS | Status: AC | PRN
  Administered 2023-03-02: 8 mL via INTRAVENOUS

## 2023-03-02 NOTE — ED Provider Notes (Signed)
[place] [facility] AT [facility] Provider Note  CSN: [id number] Arrival date & time: 03/01/23 1750  Chief Complaint(s) Numbness  HPI [name] is a 38 y.o. female with a past medical history listed below who presents to the emergency department with worsening right lower leg weakness and numbness.  Patient now feels burning/stinging pain in the leg and up to her back and abdomen.  She was seen in the emergency department 3 days ago and had MRIs with and without contrast of the thoracic and lumbar spine that were negative for demyelinating disease.  She was discharged with pain medicine and steroid taper.  Reports no improvement in worsening symptoms.  Patient reports that she has a family history of ALS raising concern for this.  The history is provided by the patient.    Past Medical History Past Medical History:  Diagnosis Date   Anemia    Anxiety    Back pain    Constipation    Hashimoto's disease    History of chicken pox    HSV infection    Joint pain    Lactose intolerance    Lower back pain    Palpitations    PCOS (polycystic ovarian syndrome)    PCOS (polycystic ovarian syndrome)    Right hip pain    SOBOE (shortness of breath on exertion)    Thyroid condition    Velamentous insertion of umbilical cord    Vitamin D deficiency    Vitiligo    Patient Active Problem List   Diagnosis Date Noted   Seasonal and perennial allergic rhinitis 12/30/2022   Seasonal allergic conjunctivitis 12/30/2022   Poor sleep 07/25/2022   Weight gain 07/25/2022   Pure hypercholesterolemia 03/20/2022   Generalized obesity with starting BMI 32 03/06/2022   Low back pain 03/06/2022   Vitamin D deficiency 03/06/2022   SOBOE (shortness of breath on exertion) 03/06/2022   Other fatigue 03/06/2022   Anxiety 07/31/2021   Gastroesophageal reflux disease without esophagitis 07/31/2021   Genital herpes simplex 07/31/2021   History of abnormal cervical  Papanicolaou smear 07/31/2021   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   Subclinical hyperthyroidism 07/31/2021   Twin pregnancy 07/31/2021   Polycystic ovary syndrome 07/31/2021   Hashimoto's disease 05/21/2021   Thyroid antibody positive 01/24/2021   Vitiligo 01/24/2021   Pregnancy 02/29/2020   Vaginal tear resulting from childbirth 02/29/2020   Hyperandrogenemia 02/04/2012   PCOS (polycystic ovarian syndrome) 02/04/2012   Home Medication(s) Prior to Admission medications   Medication Sig Start Date End Date Taking? Authorizing Provider  ibuprofen (ADVIL) 800 MG tablet Take 1 tablet (800 mg total) by mouth every 8 (eight) hours as needed. 02/27/23   Long, Arlyss Repress, MD  levocetirizine (XYZAL) 5 MG tablet Take 5 mg by mouth every evening.    [provider]  MAGNESIUM PO Take by mouth.    [provider]  methocarbamol (ROBAXIN) 500 MG tablet Take 1 tablet (500 mg total) by mouth every 8 (eight) hours as needed. 02/27/23   Long, Arlyss Repress, MD  predniSONE (STERAPRED UNI-PAK 21 TAB) 10 MG (21) TBPK tablet Take by mouth daily. Take 6 tabs by mouth daily  for 2 days, then 5 tabs for 2 days, then 4 tabs for 2 days, then 3 tabs for 2 days, 2 tabs for 2 days, then 1 tab by mouth daily for 2 days 02/28/23   Long, Arlyss Repress, MD  Prenatal Vit-Fe Fumarate-FA (PRENATAL VITAMIN PO) Take by mouth.  [provider]  selenium 50 MCG TABS tablet Take 50 mcg by mouth daily.    [provider]  UNABLE TO FIND Med Name: Collagen Powder    [provider]  VITAMIN D PO Take by mouth.    [provider]                                                                                                                                    Allergies Banana and Spinach  Review of Systems Review of Systems As noted in HPI  Physical Exam Vital Signs  I have reviewed the triage vital signs BP 108/80   Pulse 69   Temp 98.2 F (36.8 C)   Resp 18   Ht 5\' 5"  (1.651 m)   Wt  83.9 kg   LMP 02/27/2023   SpO2 100%   BMI 30.78 kg/m   Physical Exam Vitals reviewed.  Constitutional:      General: She is not in acute distress.    Appearance: She is well-developed. She is not diaphoretic.  HENT:     Head: Normocephalic and atraumatic.     Nose: Nose normal.  Eyes:     General: No scleral icterus.       Right eye: No discharge.        Left eye: No discharge.     Conjunctiva/sclera: Conjunctivae normal.     Pupils: Pupils are equal, round, and reactive to light.  Cardiovascular:     Rate and Rhythm: Normal rate and regular rhythm.     Heart sounds: No murmur heard.    No friction rub. No gallop.  Pulmonary:     Effort: Pulmonary effort is normal. No respiratory distress.     Breath sounds: Normal breath sounds. No stridor. No rales.  Abdominal:     General: There is no distension.     Palpations: Abdomen is soft.     Tenderness: There is no abdominal tenderness.  Musculoskeletal:        General: No tenderness.     Cervical back: Normal range of motion and neck supple.  Skin:    General: Skin is warm and dry.     Findings: No erythema or rash.  Neurological:     Mental Status: She is alert and oriented to person, place, and time.     Comments: Mental Status:  Alert and oriented to person, place, and time.  Attention and concentration normal.  Speech clear.  Recent memory is intact  Cranial Nerves:  II Visual Fields: Intact to confrontation. Visual fields intact. III, IV, VI: Pupils equal and reactive to light and near. Full eye movement without nystagmus  V Facial Sensation: Normal. No weakness of masticatory muscles  VII: No facial weakness or asymmetry  VIII Auditory Acuity: Grossly normal  IX/X: The uvula is midline; the palate elevates symmetrically  XI: Normal sternocleidomastoid and trapezius strength  XII: The  tongue is midline. No atrophy or fasciculations.   Motor System: Muscle Strength: 5/5 and symmetric in the upper and lower  extremities. No pronation or drift.  Muscle Tone: Tone and muscle bulk are normal in the upper and lower extremities.  Reflexes: DTRs: 1+ and symmetrical in all four extremities. No Clonus Coordination: Intact finger-to-nose, heel-to-shin. No tremor.  Sensation: decreased to entire RLE and right hemibody from midthorax down. Gait: deferred      ED Results and Treatments Labs (all labs ordered are listed, but only abnormal results are displayed) Labs Reviewed  CBC WITH DIFFERENTIAL/PLATELET - Abnormal; Notable for the following components:      Result Value   WBC 11.1 (*)    Neutro Abs 9.3 (*)    All other components within normal limits  COMPREHENSIVE METABOLIC PANEL - Abnormal; Notable for the following components:   Glucose, Bld 117 (*)    All other components within normal limits  URINALYSIS, ROUTINE W REFLEX MICROSCOPIC - Abnormal; Notable for the following components:   Hgb urine dipstick LARGE (*)    Bacteria, UA RARE (*)    All other components within normal limits  PREGNANCY, URINE                                                                                                                         EKG  EKG Interpretation Date/Time:    Ventricular Rate:    PR Interval:    QRS Duration:    QT Interval:    QTC Calculation:   R Axis:      Text Interpretation:         Radiology CT Renal Stone Study Result Date: 03/02/2023 CLINICAL DATA:  Abdominal/flank pain EXAM: CT ABDOMEN AND PELVIS WITHOUT CONTRAST TECHNIQUE: Multidetector CT imaging of the abdomen and pelvis was performed following the standard protocol without IV contrast. RADIATION DOSE REDUCTION: This exam was performed according to the departmental dose-optimization program which includes automated exposure control, adjustment of the mA and/or kV according to patient size and/or use of iterative reconstruction technique. COMPARISON:  MRI of the brain with and without contrast performed earlier today.  FINDINGS: Lower chest: No acute abnormality. Hepatobiliary: No focal liver abnormality is seen. No gallstones, gallbladder wall thickening, or biliary dilatation. Pancreas: Unremarkable. No pancreatic ductal dilatation or surrounding inflammatory changes. Spleen: Normal in size without focal abnormality. Adrenals/Urinary Tract: Adrenal glands are unremarkable. Kidneys are normal, without renal calculi, focal lesion, or hydronephrosis. Bladder is unremarkable. High attenuation material is present within the renal collecting systems bilaterally consistent with excretion of gadolinium contrast. This slightly limits evaluation for small stones. However, there is no evidence of hydronephrosis or large nephrolithiasis. Stomach/Bowel: No acute fracture or aggressive appearing lytic or blastic osseous lesion. Vascular/Lymphatic: Limited evaluation in the absence of intravenous contrast. No significant atherosclerotic plaque or aneurysm. No suspicious lymphadenopathy. Reproductive: Uterus and bilateral adnexa are unremarkable. Dystrophic calcification in the left aspect of the uterine fundus noted incidentally. This likely represents a  small uterine fibroid. Other: No abdominal wall hernia or abnormality. No abdominopelvic ascites. Musculoskeletal: No acute or significant osseous findings. IMPRESSION: 1. Small amount of extruded gadolinium contrast opacifies the renal collecting systems bilaterally and limits evaluation for small stones. However, there is no evidence of obstruction or large nephrolithiasis. 2. Small calcified uterine fibroid noted incidentally. 3. Otherwise, normal CT scan of the abdomen and pelvis. Electronically Signed   By: Malachy Moan M.D.   On: 03/02/2023 07:47   MR Cervical Spine W and Wo Contrast Result Date: 03/02/2023 CLINICAL DATA:  Portable AP supine view at 0530 hours. EXAM: MRI CERVICAL SPINE WITHOUT AND WITH CONTRAST TECHNIQUE: Multiplanar and multiecho pulse sequences of the cervical  spine, to include the craniocervical junction and cervicothoracic junction, were obtained without and with intravenous contrast. CONTRAST:  8mL GADAVIST GADOBUTROL 1 MMOL/ML IV SOLN COMPARISON:  Brain MRI today.  Thoracic and lumbar MRI 02/27/2023. FINDINGS: Alignment: Straightening of the midcervical lordosis. No significant spondylolisthesis. Vertebrae: Normal vertebral height. Bone marrow signal within normal limits for age. No marrow edema or evidence of acute osseous abnormality. Cord: Normal. No abnormal intradural enhancement or dural thickening. Posterior Fossa, vertebral arteries, paraspinal tissues: Cervicomedullary junction is within normal limits. Brain is detailed separately today. Preserved major vascular flow voids in the neck. Codominant vertebral arteries. Negative visible neck soft tissues, lung apices. Disc levels: C2-C3:  Negative. C3-C4:  Negative. C4-C5:  Mild disc desiccation, subtle disc bulging.  No stenosis. C5-C6: Disc desiccation with mild disc space loss. Mildly lobulated circumferential disc bulge with the left paracentral posterior component (series 8, image 31). Effaced ventral CSF space but no significant spinal stenosis. Mild to moderate left C6 neural foraminal stenosis. C6-C7: Less pronounced broad-based posterior disc bulging. No spinal stenosis. Mild left C7 foraminal stenosis. C7-T1: Mild facet hypertrophy, mild foraminal endplate spurring on the left. Mild left C8 foraminal stenosis. Visible upper thoracic spine appears stable, notable for small paracentral disc protrusions at T2-T3 and T3-T4 (series 5, image 7). No significant upper thoracic spinal or foraminal stenosis (series 5, image 8). IMPRESSION: 1. Normal cervical spinal cord. No acute osseous abnormality in the cervical spine. 2. Lower cervical disc degeneration with small disc protrusions. And cervicothoracic junction facet hypertrophy. No significant cervical spinal stenosis, but up to moderate left C6, and mild  left C7 and C8 nerve level foraminal stenosis. 3. Small upper thoracic disc protrusions at T2-T3 and T3-T4. Electronically Signed   By: Odessa Fleming M.D.   On: 03/02/2023 06:47   MR Brain W and Wo Contrast Result Date: 03/02/2023 CLINICAL DATA:  38 year old female with pain and numbness in the lower extremities. Known autoimmune disorder. EXAM: MRI HEAD WITHOUT AND WITH CONTRAST TECHNIQUE: Multiplanar, multiecho pulse sequences of the brain and surrounding structures were obtained without and with intravenous contrast. CONTRAST:  8mL GADAVIST GADOBUTROL 1 MMOL/ML IV SOLN COMPARISON:  Cervical spine MRI today reported separately. Thoracic and lumbar MRI 02/27/2023. FINDINGS: Brain: Cerebral volume is within normal limits. Midline structures are normally formed. No restricted diffusion to suggest acute infarction. No midline shift, mass effect, evidence of mass lesion, ventriculomegaly, extra-axial collection or acute intracranial hemorrhage. Cervicomedullary junction and pituitary are within normal limits. Wallace Cullens and white matter signal is within normal limits throughout the brain. No encephalomalacia or chronic cerebral blood products. Small cystic change to the pineal gland (normal variant). No abnormal enhancement identified. No dural thickening. Vascular: Major intracranial vascular flow voids are preserved. Following contrast the major dural venous sinuses appear to be enhancing,  patent. Skull and upper cervical spine: Cervical spine detailed separately. Visualized bone marrow signal is within normal limits. Sinuses/Orbits: Negative orbits. Minor maxillary sinus mucous retention cysts on the left. Other: Negative visible scalp and face. IMPRESSION: Normal MRI appearance of the Brain. Electronically Signed   By: Odessa Fleming M.D.   On: 03/02/2023 06:39    Medications Ordered in ED Medications  gadobutrol (GADAVIST) 1 MMOL/ML injection 8 mL (8 mLs Intravenous Contrast Given 03/02/23 0626)  gadobutrol (GADAVIST) 1  MMOL/ML injection 8 mL (8 mLs Intravenous Contrast Given 03/02/23 7829)   Procedures Procedures  (including critical care time) Medical Decision Making / ED Course   Medical Decision Making Amount and/or Complexity of Data Reviewed Labs: ordered. Decision-making details documented in ED Course. Radiology: ordered and independent interpretation performed. Decision-making details documented in ED Course.  Risk Prescription drug management.    Differential diagnoses and workup  Patient is having mostly sensory changes.  No actual weakness.  No fasciculations noted on exam that would raise concern for ALS at this time.  I will obtain MRI of the brain and cervical spine to rule out demyelinating disease in these areas.    Will also obtain labs to assess for any intra-abdominal process. CBC with mild leukocytosis.  CMP without significant electrolyte derangements or renal insufficiency. UPT negative. UA without evidence of infection but does have hematuria.  Likely from her cycle however CT scan obtained to rule out stones. On my read, CT scan did not reveal any obvious stones but there is gadolinium in her ureters and bladder.  No hydronephrosis concerning for obstructing stone.  No other obvious intra-abdominal inflammatory/infectious process.      Final Clinical Impression(s) / ED Diagnoses Final diagnoses:  Right leg numbness   The patient appears reasonably screened and/or stabilized for discharge and I doubt any other medical condition or other [facility] requiring further screening, evaluation, or treatment in the ED at this time. I have discussed the findings, Dx and Tx plan with the patient/family who expressed understanding and agree(s) with the plan. Discharge instructions discussed at length. The patient/family was given strict return precautions who verbalized understanding of the instructions. No further questions at time of discharge.  Disposition: Discharge  Condition:  Good  ED Discharge Orders     None        Follow Up: Octavia Heir, NP [address] [place] [address] [phone]  Call  to schedule an appointment for close follow up  Neurologist  Call  to schedule an appointment for close follow up    This chart was dictated using voice recognition software.  Despite best efforts to proofread,  errors can occur which can change the documentation meaning.    Nira Conn, MD 03/02/23 [phone]

## 2023-03-02 NOTE — ED Notes (Signed)
Pt to MRI

## 2023-03-03 ENCOUNTER — Ambulatory Visit: Payer: No Typology Code available for payment source | Admitting: Adult Health

## 2023-03-03 ENCOUNTER — Ambulatory Visit (INDEPENDENT_AMBULATORY_CARE_PROVIDER_SITE_OTHER): Payer: No Typology Code available for payment source | Admitting: Neurology

## 2023-03-03 ENCOUNTER — Ambulatory Visit (HOSPITAL_BASED_OUTPATIENT_CLINIC_OR_DEPARTMENT_OTHER): Payer: PRIVATE HEALTH INSURANCE | Admitting: Orthopaedic Surgery

## 2023-03-03 ENCOUNTER — Encounter: Payer: Self-pay | Admitting: Adult Health

## 2023-03-03 ENCOUNTER — Encounter: Payer: Self-pay | Admitting: Neurology

## 2023-03-03 VITALS — BP 120/80 | HR 88 | Ht 63.0 in | Wt 188.0 lb

## 2023-03-03 VITALS — BP 118/78 | HR 81 | Temp 97.5°F | Resp 18 | Ht 63.0 in | Wt 191.2 lb

## 2023-03-03 DIAGNOSIS — R29898 Other symptoms and signs involving the musculoskeletal system: Secondary | ICD-10-CM

## 2023-03-03 DIAGNOSIS — K5901 Slow transit constipation: Secondary | ICD-10-CM | POA: Diagnosis not present

## 2023-03-03 DIAGNOSIS — R202 Paresthesia of skin: Secondary | ICD-10-CM | POA: Diagnosis not present

## 2023-03-03 DIAGNOSIS — R269 Unspecified abnormalities of gait and mobility: Secondary | ICD-10-CM | POA: Insufficient documentation

## 2023-03-03 DIAGNOSIS — E063 Autoimmune thyroiditis: Secondary | ICD-10-CM

## 2023-03-03 MED ORDER — LEVOCETIRIZINE DIHYDROCHLORIDE 5 MG PO TABS: 5.0000 mg | ORAL_TABLET | Freq: Every day | ORAL | Status: DC | PRN

## 2023-03-03 MED ORDER — POLYETHYLENE GLYCOL 3350 17 GM/SCOOP PO POWD: 1.0000 | Freq: Once | ORAL | 0 refills | Status: AC

## 2023-03-03 MED ORDER — DULOXETINE HCL 60 MG PO CPEP: 60.0000 mg | ORAL_CAPSULE | Freq: Every day | ORAL | 11 refills | Status: DC

## 2023-03-03 NOTE — Progress Notes (Signed)
[facility] clinic  Provider:  Kenard Gower DNP  Code Status:  Full Code  Goals of Care:     03/01/2023    6:10 PM  Advanced Directives  Does Patient Have a Medical Advance Directive? No     Chief Complaint  Patient presents with   Acute Visit    weakness and nerve pain in abdomen and legs, need referral    Discussed the use of AI scribe software for clinical note transcription with the patient, who gave verbal consent to proceed.  HPI: Patient is a 38 y.o. female seen today for an acute visit for weakness on legs and nerve pain in abdomen. She was accompanied by her husband today.  She is using a walker.  The patient, a 38 year old with a history of Hashimoto's disease, presented with a sudden onset of bilateral lower extremity weakness and numbness, which began five days prior. The right leg was initially affected, with symptoms progressing to the left leg over the subsequent days. The patient described the sensation as "paresthesia," likening it to "pins and needles," and reported difficulty walking without the aid of a walker. The numbness extended up to the patient's abdomen and was associated with a burning sensation, particularly on the right side.  The patient also reported difficulty with bowel movements, describing a lack of sensation and difficulty engaging pelvic muscles. Despite these symptoms, the patient reported being able to feel their bladder. The patient denied any recent falls, accidents, or changes in vision.  The patient sought emergency care twice since the onset of symptoms. During these visits, MRIs of the brain, neck, thoracic, and lumbar regions were performed, all of which were reported as clear. The patient expressed concern about a potential autoimmune or systemic issue, referencing a family history of ALS in a maternal aunt.  The patient has been on prednisone for two days, given at ED, with no significant improvement in symptoms yet. The patient also  reported taking magnesium, selenium, vitamin D, and a prenatal vitamin as part of their regular regimen, but had stopped all supplements a couple of months prior due to running out.  The patient has a history of chronic low back pain, which was previously determined to be muscular in nature. They also have a known diagnosis of Hashimoto's disease, for which they have not been followed up recently due to a negative experience with a previous endocrinologist. The patient also reported having fibroids, which were discovered during a recent gynecological examination.  The patient's symptoms began concurrently with their menstrual cycle, leading them to question a potential hormonal trigger. Despite these concerns, the patient reported no changes in their diet, which they described as "pretty good," with attempts to eat gluten and dairy-free for their Hashimoto's disease. The patient denied any recent changes in exercise habits.       Past Medical History:  Diagnosis Date   Anemia    Anxiety    Back pain    Constipation    Hashimoto's disease    History of chicken pox    HSV infection    Joint pain    Lactose intolerance    Lower back pain    Palpitations    PCOS (polycystic ovarian syndrome)    PCOS (polycystic ovarian syndrome)    Right hip pain    SOBOE (shortness of breath on exertion)    Thyroid condition    Velamentous insertion of umbilical cord    Vitamin D deficiency    Vitiligo  Past Surgical History:  Procedure Laterality Date   NO PAST SURGERIES     PERINEAL LACERATION REPAIR N/A 03/01/2020   Procedure: SUTURE REPAIR PERINEAL LACERATION;  Surgeon: Candice Camp, MD;  Location: [facility] LD ORS;  Service: Gynecology;  Laterality: N/A;    Allergies  Allergen Reactions   Banana Nausea And Vomiting   Spinach Nausea And Vomiting    Stomach cramps    Outpatient Encounter Medications as of 03/03/2023  Medication Sig   ibuprofen (ADVIL) 800 MG tablet Take 1 tablet (800 mg total)  by mouth every 8 (eight) hours as needed.   methocarbamol (ROBAXIN) 500 MG tablet Take 1 tablet (500 mg total) by mouth every 8 (eight) hours as needed.   polyethylene glycol powder (GLYCOLAX/MIRALAX) 17 GM/SCOOP powder Take 255 g by mouth once for 1 dose.   predniSONE (STERAPRED UNI-PAK 21 TAB) 10 MG (21) TBPK tablet Take by mouth daily. Take 6 tabs by mouth daily  for 2 days, then 5 tabs for 2 days, then 4 tabs for 2 days, then 3 tabs for 2 days, 2 tabs for 2 days, then 1 tab by mouth daily for 2 days   levocetirizine (XYZAL) 5 MG tablet Take 1 tablet (5 mg total) by mouth daily as needed for allergies.   MAGNESIUM PO Take by mouth. (Patient not taking: Reported on 03/03/2023)   Prenatal Vit-Fe Fumarate-FA (PRENATAL VITAMIN PO) Take by mouth. (Patient not taking: Reported on 03/03/2023)   UNABLE TO FIND Med Name: Collagen Powder (Patient not taking: Reported on 03/03/2023)   VITAMIN D PO Take by mouth. (Patient not taking: Reported on 03/03/2023)   [DISCONTINUED] levocetirizine (XYZAL) 5 MG tablet Take 5 mg by mouth every evening. (Patient not taking: Reported on 03/03/2023)   [DISCONTINUED] selenium 50 MCG TABS tablet Take 50 mcg by mouth daily. (Patient not taking: Reported on 03/03/2023)   No facility-administered encounter medications on file as of 03/03/2023.    Review of Systems:  Review of Systems  Constitutional:  Negative for appetite change, chills, fatigue and fever.  HENT:  Negative for congestion, hearing loss, rhinorrhea and sore throat.   Eyes: Negative.   Respiratory:  Negative for cough, shortness of breath and wheezing.   Cardiovascular:  Negative for chest pain, palpitations and leg swelling.  Gastrointestinal:  Negative for abdominal pain, constipation, diarrhea, nausea and vomiting.  Genitourinary:  Negative for dysuria.  Musculoskeletal:  Negative for arthralgias, back pain and myalgias.  Skin:  Negative for color change, rash and wound.  Neurological:  Positive for  weakness and numbness. Negative for dizziness and headaches.  Psychiatric/Behavioral:  Negative for behavioral problems. The patient is not nervous/anxious.     Health Maintenance  Topic Date Due   Cervical Cancer Screening (HPV/Pap Cotest)  07/21/2015   INFLUENZA VACCINE  09/05/2022   COVID-19 Vaccine (4 - 2024-25 season) 10/06/2022   DTaP/Tdap/Td (2 - Td or Tdap) 12/05/2029   Hepatitis C Screening  Completed   HIV Screening  Completed   HPV VACCINES  Aged Out    Physical Exam: Vitals:   03/03/23 1042  BP: 118/78  Pulse: 81  Resp: 18  Temp: (!) 97.5 F (36.4 C)  SpO2: 97%  Weight: 191 lb 3.2 oz (86.7 kg)  Height: 5\' 3"  (1.6 m)   Body mass index is 33.87 kg/m. Physical Exam Constitutional:      Appearance: She is obese.  HENT:     Head: Normocephalic and atraumatic.     Nose: Nose normal.  Mouth/Throat:     Mouth: Mucous membranes are moist.  Eyes:     Conjunctiva/sclera: Conjunctivae normal.  Cardiovascular:     Rate and Rhythm: Normal rate and regular rhythm.  Pulmonary:     Effort: Pulmonary effort is normal.     Breath sounds: Normal breath sounds.  Abdominal:     General: Bowel sounds are normal.     Palpations: Abdomen is soft.  Musculoskeletal:        General: Normal range of motion.     Cervical back: Normal range of motion.  Skin:    General: Skin is warm and dry.  Neurological:     General: No focal deficit present.     Mental Status: She is alert and oriented to person, place, and time.  Psychiatric:        Mood and Affect: Mood normal.        Behavior: Behavior normal.        Thought Content: Thought content normal.        Judgment: Judgment normal.     Labs reviewed: Basic Metabolic Panel: Recent Labs    03/06/22 1347 06/18/22 1251 06/26/22 0459 07/25/22 0757 08/27/22 0953 02/19/23 0856 02/27/23 1141 03/01/23 1826  NA 141  --    < >  --  141  --  138 139  K 4.1  --    < >  --  4.2  --  3.9 4.8  CL 102  --    < >  --  106  --   106 106  CO2 18*  --    < >  --  23  --  22 24  GLUCOSE 74  --    < >  --  86  --  102* 117*  BUN 9  --    < >  --  14  --  14 14  CREATININE 0.70  --    < >  --  0.65  --  0.77 0.65  CALCIUM 9.5  --    < >  --  9.1  --  9.0 9.1  MG  --   --   --  2.2  --   --   --   --   TSH 3.070 2.600  --   --   --  2.01  --   --    < > = values in this interval not displayed.   Liver Function Tests: Recent Labs    03/06/22 1347 02/27/23 1141 03/01/23 1826  AST 20 17 17   ALT 15 13 13   ALKPHOS 56 38 38  BILITOT 0.4 0.7 0.5  PROT 7.7 7.0 7.4  ALBUMIN 4.8 3.8 4.0   No results for input(s): "LIPASE", "AMYLASE" in the last 8760 hours. No results for input(s): "AMMONIA" in the last 8760 hours. CBC: Recent Labs    06/26/22 0459 02/27/23 1141 03/01/23 1826  WBC 6.9 7.7 11.1*  NEUTROABS  --  5.1 9.3*  HGB 14.5 14.0 13.6  HCT 42.4 39.8 39.6  MCV 91.2 90.9 91.2  PLT 247 238 278   Lipid Panel: Recent Labs    03/06/22 1347 06/18/22 1251 02/19/23 0856  CHOL 258* 242* 214*  HDL 56 60 55  LDLCALC 188* 171* 139*  TRIG 84 65 92  CHOLHDL 4.6* 4.0 3.9   Lab Results  Component Value Date   HGBA1C 5.2 03/06/2022    Procedures since last visit: CT Renal Stone Study Result Date: 03/02/2023 CLINICAL DATA:  Abdominal/flank pain EXAM: CT ABDOMEN AND PELVIS WITHOUT CONTRAST TECHNIQUE: Multidetector CT imaging of the abdomen and pelvis was performed following the standard protocol without IV contrast. RADIATION DOSE REDUCTION: This exam was performed according to the departmental dose-optimization program which includes automated exposure control, adjustment of the mA and/or kV according to patient size and/or use of iterative reconstruction technique. COMPARISON:  MRI of the brain with and without contrast performed earlier today. FINDINGS: Lower chest: No acute abnormality. Hepatobiliary: No focal liver abnormality is seen. No gallstones, gallbladder wall thickening, or biliary dilatation. Pancreas:  Unremarkable. No pancreatic ductal dilatation or surrounding inflammatory changes. Spleen: Normal in size without focal abnormality. Adrenals/Urinary Tract: Adrenal glands are unremarkable. Kidneys are normal, without renal calculi, focal lesion, or hydronephrosis. Bladder is unremarkable. High attenuation material is present within the renal collecting systems bilaterally consistent with excretion of gadolinium contrast. This slightly limits evaluation for small stones. However, there is no evidence of hydronephrosis or large nephrolithiasis. Stomach/Bowel: No acute fracture or aggressive appearing lytic or blastic osseous lesion. Vascular/Lymphatic: Limited evaluation in the absence of intravenous contrast. No significant atherosclerotic plaque or aneurysm. No suspicious lymphadenopathy. Reproductive: Uterus and bilateral adnexa are unremarkable. Dystrophic calcification in the left aspect of the uterine fundus noted incidentally. This likely represents a small uterine fibroid. Other: No abdominal wall hernia or abnormality. No abdominopelvic ascites. Musculoskeletal: No acute or significant osseous findings. IMPRESSION: 1. Small amount of extruded gadolinium contrast opacifies the renal collecting systems bilaterally and limits evaluation for small stones. However, there is no evidence of obstruction or large nephrolithiasis. 2. Small calcified uterine fibroid noted incidentally. 3. Otherwise, normal CT scan of the abdomen and pelvis. Electronically Signed   By: Malachy Moan M.D.   On: 03/02/2023 07:47   MR Cervical Spine W and Wo Contrast Result Date: 03/02/2023 CLINICAL DATA:  Portable AP supine view at 0530 hours. EXAM: MRI CERVICAL SPINE WITHOUT AND WITH CONTRAST TECHNIQUE: Multiplanar and multiecho pulse sequences of the cervical spine, to include the craniocervical junction and cervicothoracic junction, were obtained without and with intravenous contrast. CONTRAST:  8mL GADAVIST GADOBUTROL 1 MMOL/ML  IV SOLN COMPARISON:  Brain MRI today.  Thoracic and lumbar MRI 02/27/2023. FINDINGS: Alignment: Straightening of the midcervical lordosis. No significant spondylolisthesis. Vertebrae: Normal vertebral height. Bone marrow signal within normal limits for age. No marrow edema or evidence of acute osseous abnormality. Cord: Normal. No abnormal intradural enhancement or dural thickening. Posterior Fossa, vertebral arteries, paraspinal tissues: Cervicomedullary junction is within normal limits. Brain is detailed separately today. Preserved major vascular flow voids in the neck. Codominant vertebral arteries. Negative visible neck soft tissues, lung apices. Disc levels: C2-C3:  Negative. C3-C4:  Negative. C4-C5:  Mild disc desiccation, subtle disc bulging.  No stenosis. C5-C6: Disc desiccation with mild disc space loss. Mildly lobulated circumferential disc bulge with the left paracentral posterior component (series 8, image 31). Effaced ventral CSF space but no significant spinal stenosis. Mild to moderate left C6 neural foraminal stenosis. C6-C7: Less pronounced broad-based posterior disc bulging. No spinal stenosis. Mild left C7 foraminal stenosis. C7-T1: Mild facet hypertrophy, mild foraminal endplate spurring on the left. Mild left C8 foraminal stenosis. Visible upper thoracic spine appears stable, notable for small paracentral disc protrusions at T2-T3 and T3-T4 (series 5, image 7). No significant upper thoracic spinal or foraminal stenosis (series 5, image 8). IMPRESSION: 1. Normal cervical spinal cord. No acute osseous abnormality in the cervical spine. 2. Lower cervical disc degeneration with small disc protrusions. And cervicothoracic junction facet  hypertrophy. No significant cervical spinal stenosis, but up to moderate left C6, and mild left C7 and C8 nerve level foraminal stenosis. 3. Small upper thoracic disc protrusions at T2-T3 and T3-T4. Electronically Signed   By: Odessa Fleming M.D.   On: 03/02/2023 06:47    MR Brain W and Wo Contrast Result Date: 03/02/2023 CLINICAL DATA:  39 year old female with pain and numbness in the lower extremities. Known autoimmune disorder. EXAM: MRI HEAD WITHOUT AND WITH CONTRAST TECHNIQUE: Multiplanar, multiecho pulse sequences of the brain and surrounding structures were obtained without and with intravenous contrast. CONTRAST:  8mL GADAVIST GADOBUTROL 1 MMOL/ML IV SOLN COMPARISON:  Cervical spine MRI today reported separately. Thoracic and lumbar MRI 02/27/2023. FINDINGS: Brain: Cerebral volume is within normal limits. Midline structures are normally formed. No restricted diffusion to suggest acute infarction. No midline shift, mass effect, evidence of mass lesion, ventriculomegaly, extra-axial collection or acute intracranial hemorrhage. Cervicomedullary junction and pituitary are within normal limits. Wallace Cullens and white matter signal is within normal limits throughout the brain. No encephalomalacia or chronic cerebral blood products. Small cystic change to the pineal gland (normal variant). No abnormal enhancement identified. No dural thickening. Vascular: Major intracranial vascular flow voids are preserved. Following contrast the major dural venous sinuses appear to be enhancing, patent. Skull and upper cervical spine: Cervical spine detailed separately. Visualized bone marrow signal is within normal limits. Sinuses/Orbits: Negative orbits. Minor maxillary sinus mucous retention cysts on the left. Other: Negative visible scalp and face. IMPRESSION: Normal MRI appearance of the Brain. Electronically Signed   By: Odessa Fleming M.D.   On: 03/02/2023 06:39   MR THORACIC SPINE W WO CONTRAST Result Date: 02/27/2023 CLINICAL DATA:  Mid-back pain, neuro deficit EXAM: MRI THORACIC AND LUMBAR SPINE WITHOUT AND WITH CONTRAST TECHNIQUE: Multiplanar and multiecho pulse sequences of the thoracic and lumbar spine were obtained without and with intravenous contrast. CONTRAST:  8mL GADAVIST GADOBUTROL 1  MMOL/ML IV SOLN COMPARISON:  None Available. FINDINGS: MRI THORACIC SPINE FINDINGS Alignment:  Physiologic. Vertebrae: No fracture, evidence of discitis, or bone lesion. Cord:  Normal signal and morphology. Paraspinal and other soft tissues: Negative. Disc levels: There are multilevel small degenerative disc bulges without evidence of high-grade spinal canal stenosis. No evidence of high-grade neural foraminal stenosis. MRI LUMBAR SPINE FINDINGS Segmentation:  Standard. Alignment:  Physiologic. Vertebrae:  No fracture, evidence of discitis, or bone lesion. Conus medullaris: Extends to the L1 level and appears normal. Paraspinal and other soft tissues: Negative. Disc levels: T12-L1: Unremarkable L1-L2: Unremarkable L2-L3: Unremarkable L3-L4: Mild bilateral facet degenerative change. No significant disc bulge. No spinal canal narrowing. Mild bilateral neural foraminal narrowing. L4-L5: Mild bilateral facet degenerative change no significant disc bulge. Mild spinal canal narrowing. Mild bilateral neural foraminal narrowing. L5-S1: Eccentric left disc bulge with a superimposed left paracentral disc protrusion that narrows the left lateral recess. Mild overall spinal canal narrowing. Mild-to-moderate left and mild right neural foraminal narrowing. IMPRESSION: 1. No evidence of demyelinating disease in the thoracic spine. 2. No evidence of high-grade spinal canal or neural foraminal stenosis. 3. Eccentric left disc bulge with a superimposed left paracentral disc protrusion at L5-S1 that narrows the left lateral recess and could affect the descending left S1 nerve root. Electronically Signed   By: Lorenza Cambridge M.D.   On: 02/27/2023 14:37   MR Lumbar Spine W Wo Contrast Result Date: 02/27/2023 CLINICAL DATA:  Mid-back pain, neuro deficit EXAM: MRI THORACIC AND LUMBAR SPINE WITHOUT AND WITH CONTRAST TECHNIQUE: Multiplanar and multiecho pulse  sequences of the thoracic and lumbar spine were obtained without and with  intravenous contrast. CONTRAST:  8mL GADAVIST GADOBUTROL 1 MMOL/ML IV SOLN COMPARISON:  None Available. FINDINGS: MRI THORACIC SPINE FINDINGS Alignment:  Physiologic. Vertebrae: No fracture, evidence of discitis, or bone lesion. Cord:  Normal signal and morphology. Paraspinal and other soft tissues: Negative. Disc levels: There are multilevel small degenerative disc bulges without evidence of high-grade spinal canal stenosis. No evidence of high-grade neural foraminal stenosis. MRI LUMBAR SPINE FINDINGS Segmentation:  Standard. Alignment:  Physiologic. Vertebrae:  No fracture, evidence of discitis, or bone lesion. Conus medullaris: Extends to the L1 level and appears normal. Paraspinal and other soft tissues: Negative. Disc levels: T12-L1: Unremarkable L1-L2: Unremarkable L2-L3: Unremarkable L3-L4: Mild bilateral facet degenerative change. No significant disc bulge. No spinal canal narrowing. Mild bilateral neural foraminal narrowing. L4-L5: Mild bilateral facet degenerative change no significant disc bulge. Mild spinal canal narrowing. Mild bilateral neural foraminal narrowing. L5-S1: Eccentric left disc bulge with a superimposed left paracentral disc protrusion that narrows the left lateral recess. Mild overall spinal canal narrowing. Mild-to-moderate left and mild right neural foraminal narrowing. IMPRESSION: 1. No evidence of demyelinating disease in the thoracic spine. 2. No evidence of high-grade spinal canal or neural foraminal stenosis. 3. Eccentric left disc bulge with a superimposed left paracentral disc protrusion at L5-S1 that narrows the left lateral recess and could affect the descending left S1 nerve root. Electronically Signed   By: Lorenza Cambridge M.D.   On: 02/27/2023 14:37    Assessment/Plan  Assessment and Plan    Bilateral Lower Extremity Weakness New onset of bilateral lower extremity numbness and weakness, with right side more affected than left. Paresthesia extending up to the abdomen  and shoulder blades. MRI of brain, neck, thoracic, and lumbar spine unremarkable. Patient reports difficulty with bowel movements due to decreased sensation. No urinary symptoms. No recent trauma or falls. -  fall precautions -  continue use of walker when walking -Refer to neurology for further evaluation. -Order ESR and CRP to assess for systemic inflammation. -Repeat B12 level.  Hashimoto's Thyroiditis Patient reports taking selenium and vitamin D supplements. Recent TSH level was normal. -Continue current management. -Refer to a new endocrinologist for ongoing management.  Constipation Patient reports difficulty with bowel movements due to decreased sensation. -Recommend over-the-counter Miralax. -Encourage increased vegetable intake.   General Health Maintenance -Continue current supplements including magnesium,  vitamin D, and prenatal vitamins. -Consider adding B complex         Labs/tests ordered:   ESR and CRP to assess for systemic inflammation. -Repeat B12 level.  Next appt:  Visit date not found

## 2023-03-03 NOTE — Progress Notes (Signed)
Chief Complaint  Patient presents with   New Patient (Initial Visit)    [name], [name]  Pt is here for ER referral for paresthesias. Pt states that on Wednesday/22/25, her right leg started getting numb, weak, tingly and states the feeling is spreading all the way up to her waist. Pt states she now has to use a walker to get around.       ASSESSMENT AND PLAN  [name] is a 38 y.o. female   Subacute onset of a ascending paresthesia, gait abnormality,  Extensive MRI of neuraxis showed no significant abnormalities.  Normal neurological examination, brisk upper and lower extremity reflex,   With her reported difficulties, need to rule out early phase of demyelinating radiculopathy, proceed with EMG nerve conduction study, she agree on fluoroscopy guided lumbar puncture  Cymbalta 60 mg daily  DIAGNOSTIC DATA (LABS, IMAGING, TESTING) - I reviewed patient records, labs, notes, testing and imaging myself where available.   MEDICAL HISTORY:  [name] is a 38 year old female, seen in request by her primary care from [place] adult medical nurse practitioner Coletta Memos, Amy E, for evaluation of a ascending paresthesia gait abnormality, she is accompanied by her boyfriend [name] at today's visit on March 03, 2023  History is obtained from the patient and review of electronic medical records. I personally reviewed pertinent available imaging films in PACS.   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, on February 26, 2023, at her work, while working on the hallway, she had fairly acute onset right leg numbness, she can still move it, 12 hours later, next morning, she developed ascending paresthesia involving left lower extremity, also noticed gait abnormality, reported within 24 hours, numbness has went to her lower shoulder blade region, involving perineum region, when she wiped herself, she it does not feel normal, she still have the urge to use the bathroom, but she  could not feel the defecation or urination  She denies upper extremity symptoms  She has intermittent bilateral lower extremity tingling, burning pain,  Presented to emergency room on January 24, had extensive evaluation,  MRI brain, cervical, thoracic, lumbar was normal on Jan 24th 2025  Lab, normal CBC, CMP, TSH,   PHYSICAL EXAM:   Vitals:   03/03/23 1551  BP: 120/80  Pulse: 88  Weight: 188 lb (85.3 kg)  Height: 5\' 3"  (1.6 m)   Body mass index is 33.3 kg/m.  PHYSICAL EXAMNIATION:  Gen: NAD, conversant, well nourised, well groomed                     Cardiovascular: Regular rate rhythm, no peripheral edema, warm, nontender. Eyes: Conjunctivae clear without exudates or hemorrhage Neck: Supple, no carotid bruits. Pulmonary: Clear to auscultation bilaterally   NEUROLOGICAL EXAM:  MENTAL STATUS: Speech/cognition: Awake, alert, oriented to history taking and casual conversation CRANIAL NERVES: CN II: Visual fields are full to confrontation. Pupils are round equal and briskly reactive to light. CN III, IV, VI: extraocular movement are normal. No ptosis. CN V: Facial sensation is intact to light touch CN VII: Face is symmetric with normal eye closure  CN VIII: Hearing is normal to causal conversation. CN IX, X: Phonation is normal. CN XI: Head turning and shoulder shrug are intact  MOTOR: There is no pronator drift of out-stretched arms. Muscle bulk and tone are normal. Muscle strength is normal.  REFLEXES: Reflexes are 2+ and symmetric at the biceps, triceps, knees, and ankles. Plantar responses are flexor.  SENSORY: Intact to light touch, pinprick and vibratory sensation are intact in fingers and toes.  COORDINATION: There is no trunk or limb dysmetria noted.  GAIT/STANCE: Push-up to get up from seated position, deliberate effort, unsteady,.  REVIEW OF SYSTEMS:  Full 14 system review of systems performed and notable only for as above All other review of  systems were negative.   ALLERGIES: Allergies  Allergen Reactions   Banana Nausea And Vomiting   Spinach Nausea And Vomiting    Stomach cramps    HOME MEDICATIONS: Current Outpatient Medications  Medication Sig Dispense Refill   ibuprofen (ADVIL) 800 MG tablet Take 1 tablet (800 mg total) by mouth every 8 (eight) hours as needed. 21 tablet 0   levocetirizine (XYZAL) 5 MG tablet Take 1 tablet (5 mg total) by mouth daily as needed for allergies.     MAGNESIUM PO Take by mouth.     methocarbamol (ROBAXIN) 500 MG tablet Take 1 tablet (500 mg total) by mouth every 8 (eight) hours as needed. 20 tablet 0   polyethylene glycol powder (GLYCOLAX/MIRALAX) 17 GM/SCOOP powder Take 255 g by mouth once for 1 dose. 255 g 0   predniSONE (STERAPRED UNI-PAK 21 TAB) 10 MG (21) TBPK tablet Take by mouth daily. Take 6 tabs by mouth daily  for 2 days, then 5 tabs for 2 days, then 4 tabs for 2 days, then 3 tabs for 2 days, 2 tabs for 2 days, then 1 tab by mouth daily for 2 days 42 tablet 0   Prenatal Vit-Fe Fumarate-FA (PRENATAL VITAMIN PO) Take by mouth.     UNABLE TO FIND Med Name: Collagen Powder     VITAMIN D PO Take by mouth.     No current facility-administered medications for this visit.    PAST MEDICAL HISTORY: Past Medical History:  Diagnosis Date   Anemia    Anxiety    Back pain    Constipation    Hashimoto's disease    History of chicken pox    HSV infection    Joint pain    Lactose intolerance    Lower back pain    Palpitations    PCOS (polycystic ovarian syndrome)    PCOS (polycystic ovarian syndrome)    Right hip pain    SOBOE (shortness of breath on exertion)    Thyroid condition    Velamentous insertion of umbilical cord    Vitamin D deficiency    Vitiligo     PAST SURGICAL HISTORY: Past Surgical History:  Procedure Laterality Date   NO PAST SURGERIES     PERINEAL LACERATION REPAIR N/A 03/01/2020   Procedure: SUTURE REPAIR PERINEAL LACERATION;  Surgeon: Candice Camp, MD;   Location: [facility] LD ORS;  Service: Gynecology;  Laterality: N/A;    FAMILY HISTORY: Family History  Problem Relation Age of Onset   Allergic rhinitis Mother    Urticaria Mother    Asthma Mother    Thyroid disease Mother    Hypertension Mother    Cancer Mother        Cervical Cancer   Heart disease Mother    Depression Mother    Anxiety disorder Mother    Obesity Mother    Hypertension Father    Hepatitis C Father    Allergic rhinitis Brother    Asthma Brother    Allergic rhinitis Brother    Urticaria Brother    Allergic rhinitis Brother    Thyroid disease Maternal Aunt    Thyroid disease Maternal Grandmother  Stroke Neg Hx    Hyperlipidemia Neg Hx    Early death Neg Hx     SOCIAL HISTORY: Social History   Socioeconomic History   Marital status: Significant Other    Spouse name: Tenny Craw   Number of children: Not on file   Years of education: 16+   Highest education level: Not on file  Occupational History   Occupation: Occupational therapist   Occupation: Acupuncturist  Tobacco Use   Smoking status: Never    Passive exposure: Past   Smokeless tobacco: Never  Vaping Use   Vaping status: Never Used  Substance and Sexual Activity   Alcohol use: Not Currently    Alcohol/week: 1.0 standard drink of alcohol    Types: 1 Glasses of wine per week   Drug use: No   Sexual activity: Not on file  Other Topics Concern   Not on file  Social History Narrative   Regular exercise-yes Caffeine Use-yes   Social Drivers of Health   Financial Resource Strain: Not on file  Food Insecurity: Not on file  Transportation Needs: Not on file  Physical Activity: Not on file  Stress: Not on file  Social Connections: Unknown (06/03/2021)   Received [facility], [facility]   Social Network    Social Network: Not on file  Intimate Partner Violence: Unknown (05/09/2021)   Received from [facility], [facility]   HITS    Physically Hurt: Not on file    Insult or  Talk Down To: Not on file    Threaten Physical Harm: Not on file    Scream or Curse: Not on file      Levert Feinstein, M.D. Ph.D.  [facility] [address] [place], [place] [address] Ph: [phone] Fax: [phone]  CC:  Long, Arlyss Repress, MD [address] [place],  [place] [address]  Octavia Heir, NP

## 2023-03-04 LAB — C-REACTIVE PROTEIN: CRP: <3 mg/L (ref <8.0)

## 2023-03-04 LAB — SEDIMENTATION RATE: Sed Rate: 2 mm/h (ref 0–20)

## 2023-03-04 LAB — ANA: Anti Nuclear Antibody (ANA): NEGATIVE

## 2023-03-04 LAB — VITAMIN B12: Vitamin B-12: 690 pg/mL (ref 200–1100)

## 2023-03-04 NOTE — Progress Notes (Signed)
-    vitamin B12, CRP and sed rate normal -  awaiting ANA

## 2023-03-05 ENCOUNTER — Emergency Department (HOSPITAL_COMMUNITY)
Admission: EM | Admit: 2023-03-05 | Discharge: 2023-03-05 | Disposition: A | Discharge status: Home / Self Care | Payer: PRIVATE HEALTH INSURANCE | Attending: Emergency Medicine | Admitting: Emergency Medicine

## 2023-03-05 ENCOUNTER — Emergency Department (HOSPITAL_COMMUNITY): Payer: PRIVATE HEALTH INSURANCE

## 2023-03-05 ENCOUNTER — Encounter (HOSPITAL_COMMUNITY): Payer: Self-pay

## 2023-03-05 ENCOUNTER — Other Ambulatory Visit: Payer: Self-pay

## 2023-03-05 DIAGNOSIS — M79604 Pain in right leg: Secondary | ICD-10-CM | POA: Insufficient documentation

## 2023-03-05 DIAGNOSIS — R202 Paresthesia of skin: Secondary | ICD-10-CM | POA: Insufficient documentation

## 2023-03-05 DIAGNOSIS — R29898 Other symptoms and signs involving the musculoskeletal system: Secondary | ICD-10-CM

## 2023-03-05 DIAGNOSIS — M79605 Pain in left leg: Secondary | ICD-10-CM | POA: Diagnosis not present

## 2023-03-05 DIAGNOSIS — R531 Weakness: Secondary | ICD-10-CM | POA: Diagnosis not present

## 2023-03-05 LAB — CBC WITH DIFFERENTIAL/PLATELET
Abs Immature Granulocytes: 0.07 10*3/uL (ref 0.00–0.07)
Basophils Absolute: 0 10*3/uL (ref 0.0–0.1)
Basophils Relative: 0 %
Eosinophils Absolute: 0.1 10*3/uL (ref 0.0–0.5)
Eosinophils Relative: 1 %
HCT: 47.3 % — ABNORMAL HIGH (ref 36.0–46.0)
Hemoglobin: 16.4 g/dL — ABNORMAL HIGH (ref 12.0–15.0)
Immature Granulocytes: 1 %
Lymphocytes Relative: 34 %
Lymphs Abs: 4.4 10*3/uL — ABNORMAL HIGH (ref 0.7–4.0)
MCH: 31.2 pg (ref 26.0–34.0)
MCHC: 34.7 g/dL (ref 30.0–36.0)
MCV: 89.9 fL (ref 80.0–100.0)
Monocytes Absolute: 1 10*3/uL (ref 0.1–1.0)
Monocytes Relative: 8 %
Neutro Abs: 7.5 10*3/uL (ref 1.7–7.7)
Neutrophils Relative %: 56 %
Platelets: 305 10*3/uL (ref 150–400)
RBC: 5.26 MIL/uL — ABNORMAL HIGH (ref 3.87–5.11)
RDW: 11.9 % (ref 11.5–15.5)
WBC: 13.1 10*3/uL — ABNORMAL HIGH (ref 4.0–10.5)
nRBC: 0 % (ref 0.0–0.2)

## 2023-03-05 LAB — MAGNESIUM: Magnesium: 2.1 mg/dL (ref 1.7–2.4)

## 2023-03-05 LAB — CSF CELL COUNT WITH DIFFERENTIAL
RBC Count, CSF: 0 /mm3
RBC Count, CSF: 1 /mm3 — ABNORMAL HIGH
Tube #: 1
Tube #: 4
WBC, CSF: 0 /mm3 (ref 0–5)
WBC, CSF: 1 /mm3 (ref 0–5)

## 2023-03-05 LAB — PROTEIN AND GLUCOSE, CSF
Glucose, CSF: 53 mg/dL (ref 40–70)
Total  Protein, CSF: 27 mg/dL (ref 15–45)

## 2023-03-05 LAB — BASIC METABOLIC PANEL
Anion gap: 13 (ref 5–15)
BUN: 13 mg/dL (ref 6–20)
CO2: 25 mmol/L (ref 22–32)
Calcium: 9.5 mg/dL (ref 8.9–10.3)
Chloride: 99 mmol/L (ref 98–111)
Creatinine, Ser: 0.81 mg/dL (ref 0.44–1.00)
GFR, Estimated: >60 mL/min (ref >60)
Glucose, Bld: 81 mg/dL (ref 70–99)
Potassium: 3.9 mmol/L (ref 3.5–5.1)
Sodium: 137 mmol/L (ref 135–145)

## 2023-03-05 LAB — HIV ANTIBODY (ROUTINE TESTING W REFLEX): HIV Screen 4th Generation wRfx: NONREACTIVE

## 2023-03-05 MED ORDER — IOHEXOL 350 MG/ML SOLN
80.0000 mL | Freq: Once | INTRAVENOUS | Status: AC | PRN
  Administered 2023-03-05: 80 mL via INTRAVENOUS

## 2023-03-05 MED ORDER — LIDOCAINE HCL (PF) 1 % IJ SOLN
INTRAMUSCULAR | Status: AC
  Administered 2023-03-05: 30 mL
  Filled 2023-03-05: qty 30

## 2023-03-05 NOTE — Discharge Instructions (Addendum)
While you are in the emergency room, you had a lumbar puncture that did not show any signs of infection, or elevated protein.  Based on these findings you do not have something like Guillan Barre.  Some of the other CSF studies are send outs.  You can discuss them with your neurologist when the results become available.  Your CT scan to assess the blood vessels in your legs was completed, read by the radiologist, and was read as normal.

## 2023-03-05 NOTE — ED Notes (Signed)
Unsuccessful blood draw/IV start x2.

## 2023-03-05 NOTE — ED Provider Notes (Signed)
[place] EMERGENCY DEPARTMENT AT [facility] Provider Note   CSN: [id number] Arrival date & time: 03/05/23  1109     History  Chief Complaint  Patient presents with   Extremity Weakness    [name] is a 38 y.o. female.  This is a 38 year old female who is here today for lower extremity weakness, numbness as well as burning that is in both of the legs, and also appears to affect the face.  Patient was seen here 3 times in the past 1 week, did follow with neurology.  In those visits patient has had MRI of the head, cervical, thoracic and lumbar spine.  She feels though her symptoms are getting worse.   Extremity Weakness       Home Medications Prior to Admission medications   Medication Sig Start Date End Date Taking? Authorizing Provider  DULoxetine (CYMBALTA) 60 MG capsule Take 1 capsule (60 mg total) by mouth daily. 03/03/23  Yes Levert Feinstein, MD  methocarbamol (ROBAXIN) 500 MG tablet Take 1 tablet (500 mg total) by mouth every 8 (eight) hours as needed. Patient taking differently: Take 500 mg by mouth every 6 (six) hours as needed for muscle spasms. 02/27/23  Yes Long, Arlyss Repress, MD  predniSONE (STERAPRED UNI-PAK 21 TAB) 10 MG (21) TBPK tablet Take by mouth daily. Take 6 tabs by mouth daily  for 2 days, then 5 tabs for 2 days, then 4 tabs for 2 days, then 3 tabs for 2 days, 2 tabs for 2 days, then 1 tab by mouth daily for 2 days 02/28/23  Yes Long, Arlyss Repress, MD      Allergies    Banana and Spinach    Review of Systems   Review of Systems  Musculoskeletal:  Positive for extremity weakness.    Physical Exam Updated Vital Signs BP 97/68 (BP Location: Left Arm)   Pulse 81   Temp 98.3 F (36.8 C)   Resp 18   Ht 5\' 3"  (1.6 m)   Wt 85.3 kg   LMP 02/27/2023   SpO2 99%   BMI 33.30 kg/m  Physical Exam Vitals reviewed.  Eyes:     Pupils: Pupils are equal, round, and reactive to light.  Cardiovascular:     Rate and Rhythm: Normal rate.     Pulses:  Normal pulses.  Pulmonary:     Effort: Pulmonary effort is normal.  Musculoskeletal:        General: No swelling or deformity. Normal range of motion.  Skin:    General: Skin is warm and dry.     Findings: No rash.  Neurological:     Mental Status: She is alert.     Comments: Patient endorses all over lower extremity burning as well as numbness.  Patient able to ambulate to bed, but does so in a limited, unsteady gait.     ED Results / Procedures / Treatments   Labs (all labs ordered are listed, but only abnormal results are displayed) Labs Reviewed  CBC WITH DIFFERENTIAL/PLATELET - Abnormal; Notable for the following components:      Result Value   WBC 13.1 (*)    RBC 5.26 (*)    Hemoglobin 16.4 (*)    HCT 47.3 (*)    Lymphs Abs 4.4 (*)    All other components within normal limits  CSF CELL COUNT WITH DIFFERENTIAL - Abnormal; Notable for the following components:   Appearance, CSF CLEAR (*)    RBC Count, CSF 1 (*)  All other components within normal limits  CSF CELL COUNT WITH DIFFERENTIAL - Abnormal; Notable for the following components:   Appearance, CSF CLEAR (*)    All other components within normal limits  CSF CULTURE W GRAM STAIN  CULTURE, BLOOD (ROUTINE X 2)  CULTURE, BLOOD (ROUTINE X 2)  BASIC METABOLIC PANEL  MAGNESIUM  HIV ANTIBODY (ROUTINE TESTING W REFLEX)  PROTEIN AND GLUCOSE, CSF  RPR  LYME DISEASE SEROLOGY W/REFLEX  VDRL, CSF  OLIGOCLONAL BANDS, CSF + SERM  DRAW EXTRA CLOT TUBE    EKG None  Radiology CT Angio Aortobifemoral W and/or Wo Contrast Result Date: 03/05/2023 CLINICAL DATA:  Increasing leg numbness bilaterally, concern for claudication or ischemia. EXAM: CT ANGIOGRAPHY OF ABDOMINAL AORTA WITH ILIOFEMORAL RUNOFF TECHNIQUE: Multidetector CT imaging of the abdomen, pelvis and lower extremities was performed using the standard protocol during bolus administration of intravenous contrast. Multiplanar CT image reconstructions and MIPs were  obtained to evaluate the vascular anatomy. RADIATION DOSE REDUCTION: This exam was performed according to the departmental dose-optimization program which includes automated exposure control, adjustment of the mA and/or kV according to patient size and/or use of iterative reconstruction technique. CONTRAST:  80mL OMNIPAQUE IOHEXOL 350 MG/ML SOLN COMPARISON:  03/02/2023. FINDINGS: VASCULAR Aorta: Normal caliber aorta without aneurysm, dissection, vasculitis or significant stenosis. Celiac: Patent without evidence of aneurysm, dissection, vasculitis or significant stenosis. SMA: Patent without evidence of aneurysm, dissection, vasculitis or significant stenosis. Renals: Both renal arteries are patent without evidence of aneurysm, dissection, vasculitis, fibromuscular dysplasia or significant stenosis. IMA: Patent without evidence of aneurysm, dissection, vasculitis or significant stenosis. RIGHT Lower Extremity Inflow: Common, internal and external iliac arteries are patent without evidence of aneurysm, dissection, vasculitis or significant stenosis. Outflow: Common, superficial and profunda femoral arteries and the popliteal artery are patent without evidence of aneurysm, dissection, vasculitis or significant stenosis. Runoff: Patent three vessel runoff to the ankle. LEFT Lower Extremity Inflow: Common, internal and external iliac arteries are patent without evidence of aneurysm, dissection, vasculitis or significant stenosis. Outflow: Common, superficial and profunda femoral arteries and the popliteal artery are patent without evidence of aneurysm, dissection, vasculitis or significant stenosis. Runoff: Patent three vessel runoff to the ankle. Veins: No obvious venous abnormality within the limitations of this arterial phase study. Review of the MIP images confirms the above findings. NON-VASCULAR Lower chest: No acute abnormality. Hepatobiliary: No focal liver abnormality. No gallstones, gallbladder wall  thickening, or pericholecystic fluid. No biliary dilatation. Pancreas: No focal lesion. Normal pancreatic contour. No surrounding inflammatory changes. No main pancreatic ductal dilatation. Spleen: Normal in size without focal abnormality. Adrenals/Urinary Tract: No adrenal nodule bilaterally. Bilateral kidneys enhance symmetrically. No hydronephrosis. No hydroureter. The urinary bladder is unremarkable. Stomach/Bowel: Stomach is within normal limits. No evidence of bowel wall thickening or dilatation. Stool throughout the colon. Appendix appears normal. Lymphatic: No lymphadenopathy. Reproductive: Enhancing urine lesion along the posterior wall suggestive of uterine fibroids. Otherwise uterus and bilateral adnexa are unremarkable. Other: No abdominopelvic ascites. Musculoskeletal: No abdominal wall hernia or abnormality. No suspicious lytic or blastic osseous lesions. No acute displaced fracture. Multilevel degenerative changes of the spine. Bilateral lower extremities demonstrate no evidence of fracture, dislocation, or joint effusion. No evidence of severe arthropathy. No aggressive appearing focal bone abnormality. IMPRESSION: VASCULAR 1. No acute vascular abnormality. 2. Patent three vessel runoff to the ankle bilaterally. NON-VASCULAR 1. Uterine fibroid. 2. No acute intra-abdominal or intrapelvic abnormality. Electronically Signed   By: Tish Frederickson M.D.   On: 03/05/2023 23:17  Procedures Lumbar Puncture  Date/Time: 03/05/2023 5:40 PM  Performed by: Arletha Pili, DO Authorized by: Arletha Pili, DO   Consent:    Consent obtained:  Verbal   Consent given by:  Patient   Risks, benefits, and alternatives were discussed: yes     Risks discussed:  Bleeding, infection, headache, nerve damage and pain   Alternatives discussed:  Delayed treatment Universal protocol:    Procedure explained and questions answered to patient or proxy's satisfaction: yes     Site/side marked: yes      Patient identity confirmed:  Verbally with patient Pre-procedure details:    Procedure purpose:  Diagnostic   Preparation: Patient was prepped and draped in usual sterile fashion   Anesthesia:    Anesthesia method:  Local infiltration   Local anesthetic:  Lidocaine 1% w/o epi Procedure details:    Lumbar space:  L4-L5 interspace   Patient position:  Sitting   Needle gauge:  20   Needle type:  Spinal needle - Quincke tip   Needle length (in):  2.5   Ultrasound guidance: no     Number of attempts:  1   Fluid appearance:  Clear   Tubes of fluid:  4   Total volume (ml):  20 Post-procedure details:    Puncture site:  Adhesive bandage applied   Procedure completion:  Tolerated well, no immediate complications     Medications Ordered in ED Medications  lidocaine (PF) (XYLOCAINE) 1 % injection (30 mLs  Given by Other 03/05/23 1722)  iohexol (OMNIPAQUE) 350 MG/ML injection 80 mL (80 mLs Intravenous Contrast Given 03/05/23 1946)    ED Course/ Medical Decision Making/ A&P Clinical Course as of 03/05/23 2323  Wed Mar 05, 2023  1946 Total  Protein, CSF: 27 [JS]    Clinical Course User Index [JS] Arletha Pili, DO                                 Medical Decision Making 38 year old female here today for bilateral lower extremity weakness, burning, as well as numbness and tingling affecting the face.  Plan-patient has had extremely thorough workup which is included an MRI of her entire central nervous system, has had blood work to look for signs of infection or inflammation, all of which has been negative.  She has followed with a neurologist who scheduled her for an outpatient lumbar puncture as well as EMG.  Explained to the patient how with her results at this point I had limited additional that I could offer her from the emergency room.  Patient became frustrated that people are focused on her nervous system and had not considered her vasculature.  Patient has brisk PT pulses  bilaterally, no evidence of ischemia.  Will be performing CT angiography and a lumbar puncture today.  Have ordered blood cultures, HIV, RPR, Lyme panel.  Reassessment 11:20 PM-patient CTA negative.  CSF does not show signs of infection, normal protein.  Discussed these findings with the patient, she is going to continue her outpatient follow-up.  Patient moving legs.  Amount and/or Complexity of Data Reviewed Labs: ordered. Decision-making details documented in ED Course. Radiology: ordered.  Risk Prescription drug management.           Final Clinical Impression(s) / ED Diagnoses Final diagnoses:  Pain in both lower extremities  Leg weakness, bilateral    Rx / DC Orders ED Discharge Orders  None         Anders Simmonds T, DO 03/05/23 2323

## 2023-03-05 NOTE — ED Provider Triage Note (Addendum)
Emergency Medicine Provider Triage Evaluation Note  [name] , a 38 y.o. female  was evaluated in triage.  Pt complains of numbness.  Review of Systems  Positive:  Negative:   Physical Exam  BP 116/81   Pulse 77   Temp 98.3 F (36.8 C)   Resp 16   Ht 5\' 3"  (1.6 m)   Wt 85.3 kg   LMP 02/27/2023   SpO2 98%   BMI 33.30 kg/m  Gen:   Awake, no distress   Resp:  Normal effort  MSK:   Moves extremities without difficulty  Other:    Medical Decision Making  Medically screening exam initiated at 12:29 PM.  Appropriate orders placed.  [name] was informed that the remainder of the evaluation will be completed by another provider, this initial triage assessment does not replace that evaluation, and the importance of remaining in the ED until their evaluation is complete.  Patient seen for this complaint 3 times in the past week.  Numbness initially in right leg last wednesday, then progressed to left leg, and last night radiated all the way up to her face. Last BM 2 days ago. Also with weakness and states that her legs are buckling when she tries to walk. Endorses hx of hashimoto's and vitiligo that was diagnosed in the past 3 years. States that she has ALS in the family history which patient does not think she currently has.   Patient with chronic back pain intermittently over the past 15 years, but states that she did not feel back pain before these symptoms started. Denies recent viral illness - but states that she does have a 38 year old who is sick intermittently. Denies recent medicine changes.   Patient denies urinary retention, fecal incontinence, lower extremity weakness, hx of cancer, fever, immunosuppression, IVDU, spinal procedure, significant trauma.      [name], [place] 03/05/23 1236

## 2023-03-05 NOTE — ED Triage Notes (Signed)
Patient c/o increased leg numbness in both legs, initially started on the right and progressed to the left, also reports that she feels worse when she lays down with the numbness going up to her face. No difficulty breathing, difficulty having BM, no incontinence. Pain/numbness reported as burning, patient able to ambulate but slowly.

## 2023-03-06 ENCOUNTER — Ambulatory Visit (HOSPITAL_BASED_OUTPATIENT_CLINIC_OR_DEPARTMENT_OTHER): Payer: PRIVATE HEALTH INSURANCE | Admitting: Orthopaedic Surgery

## 2023-03-06 LAB — RPR: RPR Ser Ql: NONREACTIVE

## 2023-03-06 LAB — VDRL, CSF: VDRL Quant, CSF: NONREACTIVE

## 2023-03-07 LAB — LYME DISEASE SEROLOGY W/REFLEX: Lyme Total Antibody EIA: NEGATIVE

## 2023-03-09 LAB — CSF CULTURE W GRAM STAIN
Culture: NO GROWTH
Gram Stain: NONE SEEN

## 2023-03-10 LAB — CULTURE, BLOOD (ROUTINE X 2)
Culture: NO GROWTH
Culture: NO GROWTH

## 2023-03-11 ENCOUNTER — Encounter: Payer: Self-pay | Admitting: Neurology

## 2023-03-11 ENCOUNTER — Ambulatory Visit (INDEPENDENT_AMBULATORY_CARE_PROVIDER_SITE_OTHER): Payer: No Typology Code available for payment source | Admitting: Neurology

## 2023-03-11 ENCOUNTER — Ambulatory Visit: Payer: No Typology Code available for payment source | Admitting: Allergy & Immunology

## 2023-03-11 ENCOUNTER — Ambulatory Visit: Payer: Self-pay | Admitting: Neurology

## 2023-03-11 DIAGNOSIS — R202 Paresthesia of skin: Secondary | ICD-10-CM | POA: Diagnosis not present

## 2023-03-11 DIAGNOSIS — Z0289 Encounter for other administrative examinations: Secondary | ICD-10-CM

## 2023-03-11 DIAGNOSIS — R269 Unspecified abnormalities of gait and mobility: Secondary | ICD-10-CM

## 2023-03-11 NOTE — Progress Notes (Signed)
 ASSESSMENT AND PLAN  [name] is a 38 y.o. female   Subacute onset of a ascending paresthesia, gait abnormality,  Extensive MRI of neuraxis showed no significant abnormalities.  Normal neurological examination, brisk upper and lower extremity reflex,   EMG nerve conduction study in February 2025 was normal, no evidence of large fiber peripheral nerve neuropathy, no intrinsic muscle disease.  Continue Cymbalta  60 mg daily  More extensive laboratory evaluations, including heavy metal screen, refer her to physical therapy  DIAGNOSTIC DATA (LABS, IMAGING, TESTING) - I reviewed patient records, labs, notes, testing and imaging myself where available.   MEDICAL HISTORY:  [name] is a 38 year old female, seen in request by her primary care from [facility] nurse practitioner Gil, Amy E, for evaluation of a ascending paresthesia gait abnormality, she is accompanied by her boyfriend [name] at today's visit on March 03, 2023  History is obtained from the patient and review of electronic medical records. I personally reviewed pertinent available imaging films in PACS.   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, on February 26, 2023, at her work, while working on the hallway, she had fairly acute onset right leg numbness, she can still move it, 12 hours later, next morning, she developed ascending paresthesia involving left lower extremity, also noticed gait abnormality, reported within 24 hours, numbness has went to her lower shoulder blade region, involving perineum region, when she wiped herself, she it does not feel normal, she still have the urge to use the bathroom, but she could not feel the defecation or urination  She denies upper extremity symptoms  She has intermittent bilateral lower extremity tingling, burning pain,  Presented to emergency room on January 24, had extensive evaluation,  MRI brain, cervical, thoracic, lumbar was normal on Jan  24th 2025  Lab, normal CBC, CMP, TSH,   UPDATE Feb 4 th 2025: She is accompanied by her spouse return to clinic for electrodiagnostic study today, which was normal, in specific, there was no evidence of large fiber peripheral neuropathy, intrinsic muscle disease, lumbosacral radiculopathy  Laboratory evaluation in January 2025, showed normal or negative ESR, C-reactive protein, B12, ANA, CBC with mild elevation of WBC, hemoglobin of 16.4,  She presented to emergency room on March 05, 2023, complaints of worsening gait abnormality, bilateral lower extremity burning pain, weakness, then had lumbar puncture, WBC of 1, with normal total protein of 27  She is tearful at today's visit, complains her lower extremity sensory symptoms render her not function, 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 Eyes: Conjunctivae clear without exudates or hemorrhage Neck: Supple, no carotid bruits. Pulmonary: Clear to auscultation bilaterally   NEUROLOGICAL EXAM:  MENTAL STATUS: Speech/cognition: Awake, alert, oriented to history taking and casual conversation CRANIAL NERVES: CN II: Visual fields are full to confrontation. Pupils are round equal and briskly reactive to light. CN  III, IV, VI: extraocular movement are normal. No ptosis. CN V: Facial sensation is intact to light touch CN VII: Face is symmetric with normal eye closure  CN VIII: Hearing is normal to causal conversation. CN IX, X: Phonation is normal. CN XI: Head turning and  shoulder shrug are intact  MOTOR: There is no pronator drift of out-stretched arms. Muscle bulk and tone are normal. Muscle strength is normal.  REFLEXES: Reflexes are 2+ and symmetric at the biceps, triceps, knees, and ankles. Plantar responses are flexor.  SENSORY: Intact to light touch, pinprick and vibratory sensation are intact in fingers and toes.  COORDINATION: There is no trunk or limb dysmetria noted.  GAIT/STANCE: Push-up to get up from seated position, deliberate effort, unsteady,.  REVIEW OF SYSTEMS:  Full 14 system review of systems performed and notable only for as above All other review of systems were negative.   ALLERGIES: Allergies  Allergen Reactions   Banana Nausea And Vomiting   Spinach Nausea And Vomiting    Stomach cramps    HOME MEDICATIONS: Current Outpatient Medications  Medication Sig Dispense Refill   DULoxetine  (CYMBALTA ) 60 MG capsule Take 1 capsule (60 mg total) by mouth daily. 30 capsule 11   methocarbamol  (ROBAXIN ) 500 MG tablet Take 1 tablet (500 mg total) by mouth every 8 (eight) hours as needed. (Patient taking differently: Take 500 mg by mouth every 6 (six) hours as needed for muscle spasms.) 20 tablet 0   predniSONE  (STERAPRED UNI-PAK 21 TAB) 10 MG (21) TBPK tablet Take by mouth daily. Take 6 tabs by mouth daily  for 2 days, then 5 tabs for 2 days, then 4 tabs for 2 days, then 3 tabs for 2 days, 2 tabs for 2 days, then 1 tab by mouth daily for 2 days 42 tablet 0   No current facility-administered medications for this visit.    PAST MEDICAL HISTORY: Past Medical History:  Diagnosis Date   Anemia    Anxiety    Back pain    Constipation    Hashimoto's disease    History of chicken pox    HSV infection    Joint pain    Lactose intolerance    Lower back pain    Palpitations    PCOS (polycystic ovarian syndrome)    PCOS (polycystic ovarian syndrome)    Right hip pain    SOBOE (shortness of breath on exertion)    Thyroid   condition    Velamentous insertion of umbilical cord    Vitamin D  deficiency    Vitiligo     PAST SURGICAL HISTORY: Past Surgical History:  Procedure Laterality Date   NO PAST SURGERIES     PERINEAL LACERATION REPAIR N/A 03/01/2020   Procedure: SUTURE REPAIR PERINEAL LACERATION;  Surgeon: Marget Lenis, MD;  Location: [facility];  Service: Gynecology;  Laterality: N/A;    FAMILY HISTORY: Family History  Problem Relation Age of Onset   Allergic rhinitis Mother    Urticaria Mother    Asthma Mother    Thyroid  disease Mother    Hypertension Mother    Cancer Mother        Cervical Cancer   Heart disease Mother    Depression Mother    Anxiety disorder Mother    Obesity Mother    Hypertension Father    Hepatitis C Father    Allergic rhinitis Brother    Asthma Brother    Allergic rhinitis Brother    Urticaria Brother    Allergic  rhinitis Brother    Thyroid  disease Maternal Aunt    Thyroid  disease Maternal Grandmother    Stroke Neg Hx    Hyperlipidemia Neg Hx    Early death Neg Hx     SOCIAL HISTORY: Social History   Socioeconomic History   Marital status: Significant Other    Spouse name: Okey   Number of children: Not on file   Years of education: 16+   Highest education level: Not on file  Occupational History   Occupation: Occupational therapist   Occupation: Acupuncturist  Tobacco Use   Smoking status: Never    Passive exposure: Past   Smokeless tobacco: Never  Vaping Use   Vaping status: Never Used  Substance and Sexual Activity   Alcohol use: Not Currently    Alcohol/week: 1.0 standard drink of alcohol    Types: 1 Glasses of wine per week   Drug use: No   Sexual activity: Not on file  Other Topics Concern   Not on file  Social History Narrative   Regular exercise-yes Caffeine Use-yes   Social Drivers of Health   Financial Resource Strain: Not on file  Food Insecurity: Not on file  Transportation Needs: Not on file  Physical Activity: Not  on file  Stress: Not on file  Social Connections: Unknown (06/03/2021)   Received from [facility], [facility]   Social Network    Social Network: Not on file  Intimate Partner Violence: Unknown (05/09/2021)   Received from [facility], [facility]   HITS    Physically Hurt: Not on file    Insult or Talk Down To: Not on file    Threaten Physical Harm: Not on file    Scream or Curse: Not on file      Modena Callander, M.D. Ph.D.  [facility] [address] [place], [place] [address] Ph: [phone] Fax: [phone]  CC:  Gil Greig BRAVO, NP [address] [place],  [place] [address]  Gil Greig BRAVO, NP

## 2023-03-12 ENCOUNTER — Ambulatory Visit (HOSPITAL_BASED_OUTPATIENT_CLINIC_OR_DEPARTMENT_OTHER): Payer: PRIVATE HEALTH INSURANCE | Admitting: Student

## 2023-03-12 ENCOUNTER — Encounter (HOSPITAL_BASED_OUTPATIENT_CLINIC_OR_DEPARTMENT_OTHER): Payer: Self-pay | Admitting: Student

## 2023-03-12 DIAGNOSIS — R29898 Other symptoms and signs involving the musculoskeletal system: Secondary | ICD-10-CM | POA: Diagnosis not present

## 2023-03-12 DIAGNOSIS — R202 Paresthesia of skin: Secondary | ICD-10-CM | POA: Diagnosis not present

## 2023-03-12 DIAGNOSIS — R269 Unspecified abnormalities of gait and mobility: Secondary | ICD-10-CM | POA: Diagnosis not present

## 2023-03-12 NOTE — Progress Notes (Signed)
 Chief Complaint: Numbness in bilateral legs     History of Present Illness:    [name] is a pleasant 38 y.o. female here today for evaluation of pain and numbness in both lower extremities.  This began back on January 22 and since then she has been seen multiple times in the ED as well as with neurology.  Thus far she has had MRIs of the brain, cervical, thoracic, and lumbar spine, lumbar puncture, EMG, multiple labs, and CT of the abdomen all which have been negative for a cause of her symptoms.  Today she reports numbness from about the level of the umbilicus all the way down through both lower extremities.  Feels like her legs are burning.  Is using a walker due to resulting gait instability.  States that she has no sensation around the pelvis and rectum and has not been able to have a bowel movement in 4 days.  Denies any bowel or urinary incontinence.  She was just started on Cymbalta  by neurology.  She has a follow-up scheduled tomorrow with her PCP.  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   Surgical History:   None  PMH/PSH/Family History/Social History/Meds/Allergies:    Past Medical History:  Diagnosis Date   Anemia    Anxiety    Back pain    Constipation    Hashimoto's disease    History of chicken pox    HSV infection    Joint pain    Lactose intolerance    Lower back pain    Palpitations    PCOS (polycystic ovarian syndrome)    PCOS (polycystic ovarian syndrome)    Right hip pain    SOBOE (shortness of breath on exertion)    Thyroid  condition    Velamentous insertion of umbilical cord    Vitamin D  deficiency    Vitiligo    Past Surgical History:  Procedure Laterality Date   NO PAST SURGERIES     PERINEAL LACERATION REPAIR N/A 03/01/2020   Procedure: SUTURE REPAIR PERINEAL LACERATION;  Surgeon: Marget Lenis, MD;  Location: [facility];  Service: Gynecology;  Laterality: N/A;    Social History   Socioeconomic History   Marital status: Significant Other    Spouse name: Okey   Number of children: Not on file   Years of education: 16+   Highest education level: Not on file  Occupational History   Occupation: Occupational therapist   Occupation: Acupuncturist  Tobacco Use   Smoking status: Never    Passive exposure: Past   Smokeless tobacco: Never  Vaping Use   Vaping status: Never Used  Substance and Sexual Activity   Alcohol use: Not Currently    Alcohol/week: 1.0 standard drink of alcohol    Types: 1 Glasses of wine per week   Drug use: No   Sexual activity: Not on file  Other Topics Concern   Not on file  Social History Narrative   Regular exercise-yes Caffeine Use-yes   Social Drivers of Health   Financial Resource Strain: Not on file  Food Insecurity: Not on file  Transportation Needs: Not on file  Physical Activity: Not on file  Stress: Not on file  Social Connections: Unknown (06/03/2021)   Received from [facility], [facility]  Social Network    Social Network: Not on file   Family History  Problem Relation Age of Onset   Allergic rhinitis Mother    Urticaria Mother    Asthma Mother    Thyroid  disease Mother    Hypertension Mother    Cancer Mother        Cervical Cancer   Heart disease Mother    Depression Mother    Anxiety disorder Mother    Obesity Mother    Hypertension Father    Hepatitis C Father    Allergic rhinitis Brother    Asthma Brother    Allergic rhinitis Brother    Urticaria Brother    Allergic rhinitis Brother    Thyroid  disease Maternal Aunt    Thyroid  disease Maternal Grandmother    Stroke Neg Hx    Hyperlipidemia Neg Hx    Early death Neg Hx    Allergies  Allergen Reactions   Banana Nausea And Vomiting   Spinach Nausea And Vomiting    Stomach cramps   Current Outpatient Medications  Medication Sig Dispense Refill   DULoxetine  (CYMBALTA ) 60 MG capsule Take 1 capsule (60 mg  total) by mouth daily. 30 capsule 11   methocarbamol  (ROBAXIN ) 500 MG tablet Take 1 tablet (500 mg total) by mouth every 8 (eight) hours as needed. (Patient taking differently: Take 500 mg by mouth every 6 (six) hours as needed for muscle spasms.) 20 tablet 0   predniSONE  (STERAPRED UNI-PAK 21 TAB) 10 MG (21) TBPK tablet Take by mouth daily. Take 6 tabs by mouth daily  for 2 days, then 5 tabs for 2 days, then 4 tabs for 2 days, then 3 tabs for 2 days, 2 tabs for 2 days, then 1 tab by mouth daily for 2 days 42 tablet 0   No current facility-administered medications for this visit.   NCV with EMG(electromyography) Result Date: 03/11/2023 Kristina Duos, MD     03/12/2023 11:55 AM     Full Name: [name] Gender: Female MRN #: [id number] Date of Birth: [birth year]   Visit Date: 03/11/2023 13:26 Age: 84 Years Examining Physician: Dr. Duos Kristina Referring Physician: Dr. Duos Kristina Height: 5 feet 3 inch History: 38 year old female, complains of bilateral lower extremity paresthesia, weakness, gait abnormality. Summary of the test: Nerve conduction study: Bilateral sural, superficial peroneal, right median, ulnar sensory responses were normal. Bilateral tibial, peroneal to EDB, median ulnar motor responses were normal. Electromyography: Selected needle examinations of bilateral lower extremity muscles, lumbosacral paraspinal muscles were normal. Conclusion: This is a normal study.  There is no electrodiagnostic evidence of large fiber peripheral neuropathy, or intrinsic muscle disease. Camacho Kristina. M.D. Ph.D. [facility] [address] [place], [place] [address] Tel: [phone] Fax: [phone] Verbal informed consent was obtained from the patient, patient was informed of potential risk of procedure, including bruising, bleeding, hematoma formation, infection, muscle weakness, muscle pain, numbness, among others.     MNC   Nerve / Sites Muscle Latency Ref. Amplitude Ref. Rel Amp Segments Distance  Velocity Ref. Area   ms ms mV mV %  cm m/s m/s mVms R Median - APB    Wrist APB 3.4 <=4.4 8.4 >=4.0 100 Wrist - APB 7   29.5    Upper arm APB 6.5  6.3  74.3 Upper arm - Wrist 20.6 65 >=49 22.3 R Ulnar - ADM    Wrist ADM 2.4 <=3.3 11.0 >=6.0 100 Wrist - ADM 7   32.8  B.Elbow ADM 3.7  10.0  90.9 B.Elbow - Wrist 9.6 70 >=49 32.3    A.Elbow ADM 6.2  10.7  107 A.Elbow - B.Elbow 16.6 66 >=49 33.7 R Peroneal - EDB    Ankle EDB 6.0 <=6.5 2.7 >=2.0 100 Ankle - EDB 9   16.7    Fib head EDB 11.7  2.3  88 Fib head - Ankle 25 44 >=44 17.4    Pop fossa EDB 13.2  3.3  140 Pop fossa - Fib head 9 58 >=44 23.2        Pop fossa - Ankle     L Peroneal - EDB    Ankle EDB 6.0 <=6.5 3.3 >=2.0 100 Ankle - EDB 9   15.9    Fib head EDB 11.5  3.5  103 Fib head - Ankle 26 47 >=44 17.5    Pop fossa EDB 13.1  4.6  134 Pop fossa - Fib head 10 59 >=44 25.0        Pop fossa - Ankle     R Tibial - AH    Ankle AH 5.8 <=5.8 13.3 >=4.0 100 Ankle - AH 9   54.6    Pop fossa AH 13.8  12.2  91.6 Pop fossa - Ankle 35 44 >=41 52.8 L Tibial - AH    Ankle AH 5.8 <=5.8 12.9 >=4.0 100 Ankle - AH 9   43.9    Pop fossa AH 12.7  8.9  68.7 Pop fossa - Ankle 36.6 52 >=41 35.4               SNC   Nerve / Sites Rec. Site Peak Lat Ref.  Amp Ref. Segments Distance   ms ms V V  cm R Sural - Ankle (Calf)    Calf Ankle 4.1 <=4.4 27 >=6 Calf - Ankle 14 L Sural - Ankle (Calf)    Calf Ankle 3.2 <=4.4 26 >=6 Calf - Ankle 14 R Superficial peroneal - Ankle    Lat leg Ankle 4.1 <=4.4 14 >=6 Lat leg - Ankle 14 L Superficial peroneal - Ankle    Lat leg Ankle 3.9 <=4.4 8 >=6 Lat leg - Ankle 14 R Median - Orthodromic (Dig II, Mid palm)    Dig II Wrist 2.8 <=3.4 20 >=10 Dig II - Wrist 13 R Ulnar - Orthodromic, (Dig V, Mid palm)    Dig V Wrist 2.5 <=3.1 14 >=5 Dig V - Wrist 4               F  Wave   Nerve F Lat Ref.  ms ms R Tibial - AH 50.4 <=56.0 L Tibial - AH 43.0 <=56.0 R Ulnar - ADM 22.5 <=32.0         EMG Summary Table   Spontaneous MUAP Recruitment Muscle IA Fib PSW Fasc  Other Amp Dur. Poly Pattern R. Tibialis anterior Normal None None None _______ Normal Normal Normal Normal R. Tibialis posterior Normal None None None _______ Normal Normal Normal Normal R. Peroneus longus Normal None None None _______ Normal Normal Normal Normal R. Gastrocnemius (Medial head) Normal None None None _______ Normal Normal Normal Normal R. Vastus lateralis Normal None None None _______ Normal Normal Normal Normal L. Tibialis anterior Normal None None None _______ Normal Normal Normal Normal L. Tibialis posterior Normal None None None _______ Normal Normal Normal Normal L. Peroneus longus Normal None None None _______ Normal Normal Normal Normal L. Vastus lateralis Normal None None None _______ Normal Normal Normal  Normal L. Gastrocnemius (Medial head) Normal None None None _______ Normal Normal Normal Normal R. Iliopsoas Normal None None None _______ Normal Normal Normal Normal R. Lumbar paraspinals (low) Normal None None None _______ Normal Normal Normal Normal R. Lumbar paraspinals (mid) Normal None None None _______ Normal Normal Normal Normal L. Lumbar paraspinals (low) Normal None None None _______ Normal Normal Normal Normal L. Lumbar paraspinals (mid) Normal None None None _______ Normal Normal Normal Normal     Review of Systems:   A ROS was performed including pertinent positives and negatives as documented in the HPI.  Physical Exam :   Constitutional: NAD and appears stated age Neurological: Alert and oriented Psych: Appropriate affect and cooperative Last menstrual period 02/27/2023, unknown if currently breastfeeding.   Comprehensive Musculoskeletal Exam:    Patient ambulating with a notably unsteady gait with use of a walker.  No point tenderness in the thoracic or lumbar spine or paraspinal muscles.  Full passive hip ROM bilaterally with flexion, internal rotation, and external rotation.  Patellar DTRs 2+ and equal bilaterally.  Strength testing 5/5 with hip flexion, knee  flexion and extension, and ankle dorsiflexion/plantarflexion.  Imaging:     Assessment:   38 y.o. female with 2-week history of weakness and numbness in bilateral lower extremities.  This does extend through the pelvis and into the low back and abdomen.  So far she has had extensive workup and imaging which to this point has all come back negative.  Neurology has recommended continuing to follow with her PCP.  Today I discussed that while further workup is continuing I would recommend some physical therapy, particularly aquatics and/or pelvic floor given her current symptoms.  Patient would like to proceed initially with pelvic floor therapy in hopes to address multiple issues including constipation.  May still consider future referral to aquatics.  Patient agreeable to plan.  Plan :    - Referral to pelvic floor physical therapy - Continue following with PCP for further workup     I personally saw and evaluated the patient, and participated in the management and treatment plan.  Leonce Reveal, PA-C Orthopedics

## 2023-03-12 NOTE — Procedures (Signed)
 Full Name: [name] Gender: Female MRN #: [id number] Date of Birth: 1985/10/20    Visit Date: 03/11/2023 13:26 Age: 38 Years Examining Physician: Dr. Modena Callander Referring Physician: Dr. Modena Callander Height: 5 feet 3 inch History: 38 year old female, complains of bilateral lower extremity paresthesia, weakness, gait abnormality.  Summary of the test: Nerve conduction study: Bilateral sural, superficial peroneal, right median, ulnar sensory responses were normal.  Bilateral tibial, peroneal to EDB, median ulnar motor responses were normal.  Electromyography:  Selected needle examinations of bilateral lower extremity muscles, lumbosacral paraspinal muscles were normal.  Conclusion: This is a normal study.  There is no electrodiagnostic evidence of large fiber peripheral neuropathy, or intrinsic muscle disease.    Modena Callander. M.D. Ph.D.   [facility] [address] [place], [place] [address] Tel: [phone] Fax: [phone]  Verbal informed consent was obtained from the patient, patient was informed of potential risk of procedure, including bruising, bleeding, hematoma formation, infection, muscle weakness, muscle pain, numbness, among others.        MNC    Nerve / Sites Muscle Latency Ref. Amplitude Ref. Rel Amp Segments Distance Velocity Ref. Area    ms ms mV mV %  cm m/s m/s mVms  R Median - APB     Wrist APB 3.4 <=4.4 8.4 >=4.0 100 Wrist - APB 7   29.5     Upper arm APB 6.5  6.3  74.3 Upper arm - Wrist 20.6 65 >=49 22.3  R Ulnar - ADM     Wrist ADM 2.4 <=3.3 11.0 >=6.0 100 Wrist - ADM 7   32.8     B.Elbow ADM 3.7  10.0  90.9 B.Elbow - Wrist 9.6 70 >=49 32.3     A.Elbow ADM 6.2  10.7  107 A.Elbow - B.Elbow 16.6 66 >=49 33.7  R Peroneal - EDB     Ankle EDB 6.0 <=6.5 2.7 >=2.0 100 Ankle - EDB 9   16.7     Fib head EDB 11.7  2.3  88 Fib head - Ankle 25 44 >=44 17.4     Pop fossa EDB 13.2  3.3  140 Pop fossa - Fib head 9 58 >=44 23.2          Pop fossa - Ankle      L Peroneal - EDB     Ankle EDB 6.0 <=6.5 3.3 >=2.0 100 Ankle - EDB 9   15.9     Fib head EDB 11.5  3.5  103 Fib head - Ankle 26 47 >=44 17.5     Pop fossa EDB 13.1  4.6  134 Pop fossa - Fib head 10 59 >=44 25.0         Pop fossa - Ankle      R Tibial - AH     Ankle AH 5.8 <=5.8 13.3 >=4.0 100 Ankle - AH 9   54.6     Pop fossa AH 13.8  12.2  91.6 Pop fossa - Ankle 35 44 >=41 52.8  L Tibial - AH     Ankle AH 5.8 <=5.8 12.9 >=4.0 100 Ankle - AH 9   43.9     Pop fossa AH 12.7  8.9  68.7 Pop fossa - Ankle 36.6 52 >=41 35.4                 SNC    Nerve / Sites Rec. Site Peak Lat Ref.  Amp Ref. Segments Distance  ms ms V V  cm  R Sural - Ankle (Calf)     Calf Ankle 4.1 <=4.4 27 >=6 Calf - Ankle 14  L Sural - Ankle (Calf)     Calf Ankle 3.2 <=4.4 26 >=6 Calf - Ankle 14  R Superficial peroneal - Ankle     Lat leg Ankle 4.1 <=4.4 14 >=6 Lat leg - Ankle 14  L Superficial peroneal - Ankle     Lat leg Ankle 3.9 <=4.4 8 >=6 Lat leg - Ankle 14  R Median - Orthodromic (Dig II, Mid palm)     Dig II Wrist 2.8 <=3.4 20 >=10 Dig II - Wrist 13  R Ulnar - Orthodromic, (Dig V, Mid palm)     Dig V Wrist 2.5 <=3.1 14 >=5 Dig V - Wrist 56                 F  Wave    Nerve F Lat Ref.   ms ms  R Tibial - AH 50.4 <=56.0  L Tibial - AH 43.0 <=56.0  R Ulnar - ADM 22.5 <=32.0           EMG Summary Table    Spontaneous MUAP Recruitment  Muscle IA Fib PSW Fasc Other Amp Dur. Poly Pattern  R. Tibialis anterior Normal None None None _______ Normal Normal Normal Normal  R. Tibialis posterior Normal None None None _______ Normal Normal Normal Normal  R. Peroneus longus Normal None None None _______ Normal Normal Normal Normal  R. Gastrocnemius (Medial head) Normal None None None _______ Normal Normal Normal Normal  R. Vastus lateralis Normal None None None _______ Normal Normal Normal Normal  L. Tibialis anterior Normal None None None _______ Normal Normal Normal Normal   L. Tibialis posterior Normal None None None _______ Normal Normal Normal Normal  L. Peroneus longus Normal None None None _______ Normal Normal Normal Normal  L. Vastus lateralis Normal None None None _______ Normal Normal Normal Normal  L. Gastrocnemius (Medial head) Normal None None None _______ Normal Normal Normal Normal  R. Iliopsoas Normal None None None _______ Normal Normal Normal Normal  R. Lumbar paraspinals (low) Normal None None None _______ Normal Normal Normal Normal  R. Lumbar paraspinals (mid) Normal None None None _______ Normal Normal Normal Normal  L. Lumbar paraspinals (low) Normal None None None _______ Normal Normal Normal Normal  L. Lumbar paraspinals (mid) Normal None None None _______ Normal Normal Normal Normal

## 2023-03-13 ENCOUNTER — Other Ambulatory Visit: Payer: Self-pay | Admitting: Orthopedic Surgery

## 2023-03-13 ENCOUNTER — Encounter: Payer: Self-pay | Admitting: Orthopedic Surgery

## 2023-03-13 ENCOUNTER — Ambulatory Visit: Payer: No Typology Code available for payment source | Admitting: Orthopedic Surgery

## 2023-03-13 VITALS — BP 124/80 | HR 90 | Temp 97.9°F | Resp 16 | Ht 63.0 in | Wt 186.2 lb

## 2023-03-13 DIAGNOSIS — R202 Paresthesia of skin: Secondary | ICD-10-CM

## 2023-03-13 DIAGNOSIS — G629 Polyneuropathy, unspecified: Secondary | ICD-10-CM

## 2023-03-13 DIAGNOSIS — M6281 Muscle weakness (generalized): Secondary | ICD-10-CM

## 2023-03-13 DIAGNOSIS — K5901 Slow transit constipation: Secondary | ICD-10-CM | POA: Diagnosis not present

## 2023-03-13 LAB — DRUG SCREEN 10 W/CONF, SERUM
Amphetamines, IA: NEGATIVE ng/mL
Barbiturates, IA: NEGATIVE ug/mL
Benzodiazepines, IA: NEGATIVE ng/mL
Cocaine & Metabolite, IA: NEGATIVE ng/mL
Methadone, IA: NEGATIVE ng/mL
Opiates, IA: NEGATIVE ng/mL
Oxycodones, IA: NEGATIVE ng/mL
Phencyclidine, IA: NEGATIVE ng/mL
Propoxyphene, IA: NEGATIVE ng/mL
THC(Marijuana) Metabolite, IA: NEGATIVE ng/mL

## 2023-03-13 MED ORDER — PREGABALIN 75 MG PO CAPS: 75.0000 mg | ORAL_CAPSULE | Freq: Two times a day (BID) | ORAL | 3 refills | Status: DC

## 2023-03-13 NOTE — Progress Notes (Signed)
 Careteam: Patient Care Team: Gil Greig BRAVO, NP as PCP - General (Adult Health Nurse Practitioner) Nahser, Aleene PARAS, MD as PCP - Cardiology (Cardiology)  Seen by: Greig Gil, AGNP-C  PLACE OF SERVICE:  [facility]  Advanced Directive information Does Patient Have a Medical Advance Directive?: No, Would patient like information on creating a medical advance directive?: No - Patient declined  Allergies  Allergen Reactions   Banana Nausea And Vomiting   Spinach Nausea And Vomiting    Stomach cramps    Chief Complaint  Patient presents with   Acute Visit    Patient complains of leg pain, numbness, tingling, and constipation     HPI: Patient is a 38 y.o. female   Discussed the use of AI scribe software for clinical note transcription with the patient, who gave verbal consent to proceed.  History of Present Illness   [name] is a 38 year old female who presents with severe constipation and neuropathic symptoms.  Neuropathic symptoms began on February 26, 2023, with right leg numbness while walking at work. Symptoms worsened the next day, spreading to the left side and lower abdomen, with numbness, tingling, a gripping or spasming feeling, burning sensation, and internal swelling. She is having trouble ambulating on her own and using a walker this time. No loss of bowel or bladder function. She is concerned of muscle wasting and foot drop. She has visited the emergency room three times in January 2025, where MRIs of the brain, cervical/thoracic/lumbar spine no demyelinating disease or abnormal cervical spinal cord processes. Cbc/diff, CMP, TSH, HIV, Lyme disease, RPR, ANA, CPR and sed rate normal. Blood cultures no growth. CSF unremarkable. She was referred to neurology and orthopedics and started on Cymbalta  for ongoing paresthesia's/ neuropathy. A prednisone  taper was tried without improvement.  Additional symptoms include fluctuating blood pressure, sweating, and clamminess,  but no fever. She has a history of seasonal and environmental allergies, but recent allergist testing did not reveal significant findings. No recent illness, fever, or respiratory symptoms. No recent vaccinations. A history of chronic green nasal discharge improved after replacing her home's ductwork. She has tried her mother's old Tramadol without relief. She uses a walker for balance and mobility, with no falls despite frequent loss of balance. Treatment options for neuropathy discussed, she is willing to try Lyrica  today.   Severe constipation is present, with no bowel movement in six days. She describes a sensation of her bowels not moving and has used over-the-counter treatments like Miralax  every two days and magnesium supplements without relief. Drinks water well. She expresses significant concern about the constipation, stating 'I need to poop or I'm gonna die.'         Review of Systems:  Review of Systems  Constitutional: Negative.   HENT: Negative.    Respiratory: Negative.    Cardiovascular: Negative.   Gastrointestinal:  Positive for constipation.  Genitourinary: Negative.   Musculoskeletal:  Positive for myalgias. Negative for falls.  Skin: Negative.   Neurological:  Positive for tingling, sensory change and weakness.  Psychiatric/Behavioral: Negative.      Past Medical History:  Diagnosis Date   Anemia    Anxiety    Back pain    Constipation    Hashimoto's disease    History of chicken pox    HSV infection    Joint pain    Lactose intolerance    Lower back pain    Palpitations    PCOS (polycystic ovarian syndrome)  PCOS (polycystic ovarian syndrome)    Right hip pain    SOBOE (shortness of breath on exertion)    Thyroid  condition    Velamentous insertion of umbilical cord    Vitamin D  deficiency    Vitiligo    Past Surgical History:  Procedure Laterality Date   NO PAST SURGERIES     PERINEAL LACERATION REPAIR N/A 03/01/2020   Procedure: SUTURE REPAIR  PERINEAL LACERATION;  Surgeon: Marget Lenis, MD;  Location: [facility] LD [facility];  Service: Gynecology;  Laterality: N/A;   Social History:   reports that she has never smoked. She has been exposed to tobacco smoke. She has never used smokeless tobacco. She reports that she does not currently use alcohol after a past usage of about 1.0 standard drink of alcohol per week. She reports that she does not use drugs.  Family History  Problem Relation Age of Onset   Allergic rhinitis Mother    Urticaria Mother    Asthma Mother    Thyroid  disease Mother    Hypertension Mother    Cancer Mother        Cervical Cancer   Heart disease Mother    Depression Mother    Anxiety disorder Mother    Obesity Mother    Hypertension Father    Hepatitis C Father    Allergic rhinitis Brother    Asthma Brother    Allergic rhinitis Brother    Urticaria Brother    Allergic rhinitis Brother    Thyroid  disease Maternal Aunt    Thyroid  disease Maternal Grandmother    Stroke Neg Hx    Hyperlipidemia Neg Hx    Early death Neg Hx     Medications: Patient's Medications  New Prescriptions   No medications on file  Previous Medications   DULOXETINE  (CYMBALTA ) 60 MG CAPSULE    Take 1 capsule (60 mg total) by mouth daily.  Modified Medications   No medications on file  Discontinued Medications   METHOCARBAMOL  (ROBAXIN ) 500 MG TABLET    Take 1 tablet (500 mg total) by mouth every 8 (eight) hours as needed.   PREDNISONE  (STERAPRED UNI-PAK 21 TAB) 10 MG (21) TBPK TABLET    Take by mouth daily. Take 6 tabs by mouth daily  for 2 days, then 5 tabs for 2 days, then 4 tabs for 2 days, then 3 tabs for 2 days, 2 tabs for 2 days, then 1 tab by mouth daily for 2 days    Physical Exam:  Vitals:   03/13/23 1425  BP: 124/80  Pulse: 90  Resp: 16  Temp: 97.9 F (36.6 C)  SpO2: 99%  Weight: 186 lb 3.2 oz (84.5 kg)  Height: 5' 3 (1.6 m)   Body mass index is 32.98 kg/m. Wt Readings from Last 3 Encounters:  03/13/23 186 lb  3.2 oz (84.5 kg)  03/05/23 188 lb (85.3 kg)  03/03/23 188 lb (85.3 kg)    Physical Exam Vitals reviewed.  Constitutional:      General: She is not in acute distress. HENT:     Head: Normocephalic.  Eyes:     General:        Right eye: No discharge.        Left eye: No discharge.  Cardiovascular:     Rate and Rhythm: Normal rate and regular rhythm.     Pulses: Normal pulses.     Heart sounds: Normal heart sounds.  Pulmonary:     Effort: Pulmonary effort is normal.  Breath sounds: Normal breath sounds.  Abdominal:     General: Bowel sounds are normal.     Palpations: Abdomen is soft.  Musculoskeletal:     Cervical back: Normal and neck supple.     Thoracic back: Normal.     Lumbar back: Normal.     Right lower leg: No edema.     Left lower leg: No edema.  Skin:    General: Skin is warm.     Capillary Refill: Capillary refill takes less than 2 seconds.  Neurological:     General: No focal deficit present.     Mental Status: She is alert and oriented to person, place, and time.     Motor: No weakness.     Coordination: Coordination abnormal.     Gait: Gait abnormal.  Psychiatric:        Mood and Affect: Mood normal.     Labs reviewed: Basic Metabolic Panel: Recent Labs    06/18/22 1251 06/26/22 0459 07/25/22 0757 08/27/22 0953 02/19/23 0856 02/27/23 1141 03/01/23 1826 03/05/23 1217 03/11/23 1545  NA  --    < >  --    < >  --    < > 139 137 140  K  --    < >  --    < >  --    < > 4.8 3.9 3.7  CL  --    < >  --    < >  --    < > 106 99 101  CO2  --    < >  --    < >  --    < > 24 25 24   GLUCOSE  --    < >  --    < >  --    < > 117* 81 79  BUN  --    < >  --    < >  --    < > 14 13 12   CREATININE  --    < >  --    < >  --    < > 0.65 0.81 0.78  CALCIUM   --    < >  --    < >  --    < > 9.1 9.5 9.5  MG  --   --  2.2  --   --   --   --  2.1  --   TSH 2.600  --   --   --  2.01  --   --   --   --    < > = values in this interval not displayed.   Liver  Function Tests: Recent Labs    02/27/23 1141 03/01/23 1826 03/11/23 1545  AST 17 17 14   ALT 13 13 12   ALKPHOS 38 38 49  BILITOT 0.7 0.5 0.2  PROT 7.0 7.4 7.0  ALBUMIN 3.8 4.0 4.4   No results for input(s): LIPASE, AMYLASE in the last 8760 hours. No results for input(s): AMMONIA in the last 8760 hours. CBC: Recent Labs    03/01/23 1826 03/05/23 1217 03/11/23 1545  WBC 11.1* 13.1* 14.8*  NEUTROABS 9.3* 7.5 7.0  HGB 13.6 16.4* 15.4  HCT 39.6 47.3* 46.0  MCV 91.2 89.9 95  PLT 278 305 306   Lipid Panel: Recent Labs    06/18/22 1251 02/19/23 0856  CHOL 242* 214*  HDL 60 55  LDLCALC 171* 139*  TRIG 65 92  CHOLHDL 4.0 3.9  TSH: Recent Labs    06/18/22 1251 02/19/23 0856  TSH 2.600 2.01   A1C: Lab Results  Component Value Date   HGBA1C 5.2 03/06/2022     Assessment/Plan: 1. Paresthesia of both lower extremities (Primary) - onset 01/23 while working - extensive workup unremarkable> including MRI and labs - abnormal gait and balance today - unable to work as OT - completed prednisone  taper without improvement - completed neurology and orthopedic evaluation - FMLA paperwork completed - MRI lumbar spine noted L5-S1 disc buldge> ? Neurosurgery evaluation - Ambulatory referral to Rheumatology  2. Muscle weakness of lower extremity - see above - abnormal gait and balance - ambulating with walker - may need PT  3. Neuropathy - see above - not interested in gabapentin  - start Lyrica  - cont Cymbalta  - pregabalin  (LYRICA ) 75 MG capsule; Take 1 capsule (75 mg total) by mouth 2 (two) times daily.  Dispense: 60 capsule; Refill: 3  4. Slow transit constipation - no BM x 6 days - using miralax    Total time: 45 minutes. Greater than 50% of total time spent doing patient education regarding recent paresthesias, neuropathy, unstable gait and constipation including symptom/medication management.    Next appt: Visit date not found  Jimma Ortman Gil BODILY  [facility] [phone]

## 2023-03-13 NOTE — Patient Instructions (Addendum)
 Miralax  twice daily x 3 days> if you do not go> try magnesium citrate  Referral to [facility] made  Consider neurology outside [facility]  Consider neurosurgery evaluation

## 2023-03-14 LAB — OLIGOCLONAL BANDS, CSF + SERM

## 2023-03-15 LAB — CBC WITH DIFFERENTIAL/PLATELET
Basophils Absolute: 0 10*3/uL (ref 0.0–0.2)
Basos: 0 %
EOS (ABSOLUTE): 0.2 10*3/uL (ref 0.0–0.4)
Eos: 1 %
Hematocrit: 46 % (ref 34.0–46.6)
Hemoglobin: 15.4 g/dL (ref 11.1–15.9)
Immature Grans (Abs): 0.2 10*3/uL — ABNORMAL HIGH (ref 0.0–0.1)
Immature Granulocytes: 1 %
Lymphocytes Absolute: 6.4 10*3/uL — ABNORMAL HIGH (ref 0.7–3.1)
Lymphs: 44 %
MCH: 31.7 pg (ref 26.6–33.0)
MCHC: 33.5 g/dL (ref 31.5–35.7)
MCV: 95 fL (ref 79–97)
Monocytes Absolute: 1 10*3/uL — ABNORMAL HIGH (ref 0.1–0.9)
Monocytes: 7 %
Neutrophils Absolute: 7 10*3/uL (ref 1.4–7.0)
Neutrophils: 47 %
Platelets: 306 10*3/uL (ref 150–450)
RBC: 4.86 x10E6/uL (ref 3.77–5.28)
RDW: 12 % (ref 11.7–15.4)
WBC: 14.8 10*3/uL — ABNORMAL HIGH (ref 3.4–10.8)

## 2023-03-15 LAB — COMPREHENSIVE METABOLIC PANEL
ALT: 12 [IU]/L (ref 0–32)
AST: 14 [IU]/L (ref 0–40)
Albumin: 4.4 g/dL (ref 3.9–4.9)
Alkaline Phosphatase: 49 [IU]/L (ref 44–121)
BUN/Creatinine Ratio: 15 (ref 9–23)
BUN: 12 mg/dL (ref 6–20)
Bilirubin Total: 0.2 mg/dL (ref 0.0–1.2)
CO2: 24 mmol/L (ref 20–29)
Calcium: 9.5 mg/dL (ref 8.7–10.2)
Chloride: 101 mmol/L (ref 96–106)
Creatinine, Ser: 0.78 mg/dL (ref 0.57–1.00)
Globulin, Total: 2.6 g/dL (ref 1.5–4.5)
Glucose: 79 mg/dL (ref 70–99)
Potassium: 3.7 mmol/L (ref 3.5–5.2)
Sodium: 140 mmol/L (ref 134–144)
Total Protein: 7 g/dL (ref 6.0–8.5)
eGFR: 100 mL/min/{1.73_m2} (ref >59)

## 2023-03-15 LAB — HEAVY METALS PROFILE II, BLOOD
Arsenic: 3 ug/L (ref 0–9)
Cadmium: <0.5 ug/L (ref 0.0–1.2)
Lead, Blood: <1 ug/dL (ref 0.0–3.4)
Mercury: <1 ug/L (ref 0.0–14.9)

## 2023-03-15 LAB — THYROID PEROXIDASE ANTIBODY: Thyroperoxidase Ab SerPl-aCnc: 206 [IU]/mL — ABNORMAL HIGH (ref 0–34)

## 2023-03-15 LAB — CK: Total CK: 28 U/L — ABNORMAL LOW (ref 32–182)

## 2023-03-15 NOTE — Progress Notes (Signed)
 EMG.  Nerve conduction study report is under procedure tab

## 2023-03-17 ENCOUNTER — Encounter: Payer: Self-pay | Admitting: Neurology

## 2023-03-18 ENCOUNTER — Other Ambulatory Visit: Payer: Self-pay | Admitting: Orthopedic Surgery

## 2023-03-18 ENCOUNTER — Encounter: Payer: Self-pay | Admitting: Orthopedic Surgery

## 2023-03-18 ENCOUNTER — Other Ambulatory Visit: Payer: No Typology Code available for payment source

## 2023-03-18 ENCOUNTER — Ambulatory Visit: Payer: PRIVATE HEALTH INSURANCE | Attending: Neurology | Admitting: Physical Therapy

## 2023-03-18 DIAGNOSIS — R202 Paresthesia of skin: Secondary | ICD-10-CM | POA: Diagnosis not present

## 2023-03-18 DIAGNOSIS — R2681 Unsteadiness on feet: Secondary | ICD-10-CM | POA: Diagnosis present

## 2023-03-18 DIAGNOSIS — M6281 Muscle weakness (generalized): Secondary | ICD-10-CM | POA: Diagnosis present

## 2023-03-18 DIAGNOSIS — R2689 Other abnormalities of gait and mobility: Secondary | ICD-10-CM | POA: Diagnosis present

## 2023-03-18 DIAGNOSIS — R269 Unspecified abnormalities of gait and mobility: Secondary | ICD-10-CM | POA: Insufficient documentation

## 2023-03-18 DIAGNOSIS — M5459 Other low back pain: Secondary | ICD-10-CM | POA: Insufficient documentation

## 2023-03-18 DIAGNOSIS — D72829 Elevated white blood cell count, unspecified: Secondary | ICD-10-CM

## 2023-03-18 LAB — CBC WITH DIFFERENTIAL/PLATELET
Absolute Lymphocytes: 3123 {cells}/uL (ref 850–3900)
Absolute Monocytes: 731 {cells}/uL (ref 200–950)
Basophils Absolute: 26 {cells}/uL (ref 0–200)
Basophils Relative: 0.3 %
Eosinophils Absolute: 226 {cells}/uL (ref 15–500)
Eosinophils Relative: 2.6 %
HCT: 40.4 % (ref 35.0–45.0)
Hemoglobin: 13.6 g/dL (ref 11.7–15.5)
MCH: 31.5 pg (ref 27.0–33.0)
MCHC: 33.7 g/dL (ref 32.0–36.0)
MCV: 93.5 fL (ref 80.0–100.0)
MPV: 10.4 fL (ref 7.5–12.5)
Monocytes Relative: 8.4 %
Neutro Abs: 4594 {cells}/uL (ref 1500–7800)
Neutrophils Relative %: 52.8 %
Platelets: 231 10*3/uL (ref 140–400)
RBC: 4.32 10*6/uL (ref 3.80–5.10)
RDW: 11.8 % (ref 11.0–15.0)
Total Lymphocyte: 35.9 %
WBC: 8.7 10*3/uL (ref 3.8–10.8)
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
## 2023-03-19 ENCOUNTER — Encounter: Payer: Self-pay | Admitting: Orthopedic Surgery

## 2023-03-20 ENCOUNTER — Ambulatory Visit: Payer: PRIVATE HEALTH INSURANCE | Admitting: Physical Therapy

## 2023-03-24 ENCOUNTER — Ambulatory Visit: Payer: PRIVATE HEALTH INSURANCE | Admitting: Physical Therapy

## 2023-03-24 DIAGNOSIS — R2689 Other abnormalities of gait and mobility: Secondary | ICD-10-CM

## 2023-03-24 DIAGNOSIS — M5459 Other low back pain: Secondary | ICD-10-CM

## 2023-03-24 DIAGNOSIS — R2681 Unsteadiness on feet: Secondary | ICD-10-CM

## 2023-03-24 DIAGNOSIS — M6281 Muscle weakness (generalized): Secondary | ICD-10-CM

## 2023-03-24 NOTE — Therapy (Signed)
OUTPATIENT PHYSICAL THERAPY NEURO TREATMENT   Patient Name: [name] MRN: [id number] DOB:11-30-1985, 38 y.o., female Today's Date: 03/24/2023   PCP: Octavia Heir, NP REFERRING PROVIDER: Amador Cunas, PA-C  END OF SESSION:  PT End of Session - 03/24/23 1013     Visit Number 2    Number of Visits 13   Plus eval   Date for PT Re-Evaluation 05/13/23    Authorization Type [employer]    PT Start Time 1010    PT Stop Time 1055    PT Time Calculation (min) 45 min    Equipment Utilized During Treatment Gait belt    Activity Tolerance Patient tolerated treatment well    Behavior During Therapy WFL for tasks assessed/performed              Past Medical History:  Diagnosis Date   Anemia    Anxiety    Back pain    Constipation    Hashimoto's disease    History of chicken pox    HSV infection    Joint pain    Lactose intolerance    Lower back pain    Palpitations    PCOS (polycystic ovarian syndrome)    PCOS (polycystic ovarian syndrome)    Right hip pain    SOBOE (shortness of breath on exertion)    Thyroid condition    Velamentous insertion of umbilical cord    Vitamin D deficiency    Vitiligo    Past Surgical History:  Procedure Laterality Date   NO PAST SURGERIES     PERINEAL LACERATION REPAIR N/A 03/01/2020   Procedure: SUTURE REPAIR PERINEAL LACERATION;  Surgeon: Candice Camp, MD;  Location: [facility];  Service: Gynecology;  Laterality: N/A;   Patient Active Problem List   Diagnosis Date Noted   Gait abnormality 03/03/2023   Paresthesia 03/03/2023   Seasonal and perennial allergic rhinitis 12/30/2022   Seasonal allergic conjunctivitis 12/30/2022   Poor sleep 07/25/2022   Weight gain 07/25/2022   Pure hypercholesterolemia 03/20/2022   Generalized obesity with starting BMI 32 03/06/2022   Low back pain 03/06/2022   Vitamin D deficiency 03/06/2022   SOBOE (shortness of breath on exertion) 03/06/2022   Other fatigue 03/06/2022   Anxiety 07/31/2021    Gastroesophageal reflux disease without esophagitis 07/31/2021   Genital herpes simplex 07/31/2021   History of abnormal cervical Papanicolaou smear 07/31/2021   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   Subclinical hyperthyroidism 07/31/2021   Twin pregnancy 07/31/2021   Polycystic ovary syndrome 07/31/2021   Hashimoto's disease 05/21/2021   Thyroid antibody positive 01/24/2021   Vitiligo 01/24/2021   Pregnancy 02/29/2020   Vaginal tear resulting from childbirth 02/29/2020   Hyperandrogenemia 02/04/2012   PCOS (polycystic ovarian syndrome) 02/04/2012    ONSET DATE: 03/12/2023 (referral)   REFERRING DIAG: R29.898 (ICD-10-CM) - Bilateral leg weakness R20.2 (ICD-10-CM) - Paresthesia  THERAPY DIAG:  Other abnormalities of gait and mobility  Unsteadiness on feet  Muscle weakness (generalized)  Other low back pain  Rationale for Evaluation and Treatment: Rehabilitation  SUBJECTIVE:  SUBJECTIVE STATEMENT:  Pt enters clinic today with her SPC, has upgraded from RW. Pt reports that at home she intermittently furniture walks (has done less of this recently) and she has not been using an AD at home. Pt reports no falls since initial eval and no pain but does have ongoing pins and needles from the backs of her knees down and numbness in her buttocks and thighs.  Pt reports that she is having an easier time with BMs, needs less medications and feels slightly less tight. Pt continues to have neuropathy from her buttocks area down into BLE. Pt reports that her symptoms do tend to fluctuate but overall she feels like things have improved (25% better). Pt also reports that when she is having a good day and feeling good she pushes herself and does more but then maybe overdoes it and then feels the effects the next day. Pt  reports that her symptoms are the worst at night, once she stops moving her tingling gets worse and her toes get tighter.   Pt reports it is painful to try and sit for longer than 30 min, painful to stand for more than a few minutes.  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 She reports having a history of tightness in her lower back and sacrum area and that she would throw out her back several times a year. Prior to onset of symptoms she was feeling tightness/hypermobile in her back and R QL region but had no true injury prior to her symptoms. Pt reports having chronic back issues over the years and that her R QL has never been the same since a dead lift injury.    Per PT eval: Pt presents w/RW and reports she has been using it for a few weeks. 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 Pt did not think much of it at the time, but by the next day, it had spread to her LLE and up to her umbilicus. Pt reports she has a constant burning sensation distal to her umbilicus and did have foot drop but her strength is slowly returning. States she feels like she has an egg in her anus and it is extremely uncomfortable to sit. Reports she feels like she has a tourniquet on her legs and tape wrapped around her ankles. BP fluctuates at times and she has random clamminess and sweatiness. Unsure if this is due to blood glucose. Has had multiple imaging and tests performed, including MRI, lumbar puncture and EMG. All came back normal.    Pt accompanied by: self  PERTINENT HISTORY: None relevant on file   PAIN:  Are you having pain? No Pt reports feeling "contracted" every where and a constant burning from her waist down   PRECAUTIONS: Fall  RED FLAGS: None   WEIGHT BEARING RESTRICTIONS: No  FALLS: Has patient fallen in last 6 months? No  LIVING ENVIRONMENT: Lives with: lives with their partner and lives with their  daughter Lives in: House/apartment Stairs: Yes: External: 2 steps; none Has following equipment at home: Environmental consultant - 2 wheeled and Tour manager  PLOF: Independent  PATIENT GOALS: "To improve strength and balance the best that I can"   OBJECTIVE:  Note: Objective measures were completed at Evaluation unless otherwise noted.  DIAGNOSTIC FINDINGS: MRI of lumbar spine from 02/27/23  IMPRESSION: 1. No evidence of demyelinating disease in the thoracic  spine. 2. No evidence of high-grade spinal canal or neural foraminal stenosis. 3. Eccentric left disc bulge with a superimposed left paracentral disc protrusion at L5-S1 that narrows the left lateral recess and could affect the descending left S1 nerve root.  MRI of brain from 03/02/23  IMPRESSION: Normal MRI appearance of the Brain.   MRI of thoracic spine from 02/27/23  IMPRESSION: 1. No evidence of demyelinating disease in the thoracic spine. 2. No evidence of high-grade spinal canal or neural foraminal stenosis. 3. Eccentric left disc bulge with a superimposed left paracentral disc protrusion at L5-S1 that narrows the left lateral recess and could affect the descending left S1 nerve root.  EMG of lower extremity 03/11/23 Conclusion: This is a normal study.  There is no electrodiagnostic evidence of large fiber peripheral neuropathy, or intrinsic muscle disease.  COGNITION: Overall cognitive status: Within functional limits for tasks assessed   SENSATION: Pt reports constant burning/tingling from the waist down   POSTURE: No Significant postural limitations  LOWER EXTREMITY ROM:     Active  Right Eval Left Eval  Hip flexion    Hip extension    Hip abduction    Hip adduction    Hip internal rotation    Hip external rotation    Knee flexion    Knee extension    Ankle dorsiflexion    Ankle plantarflexion    Ankle inversion    Ankle eversion     (Blank rows = not tested)  LOWER EXTREMITY MMT:  Tested in seated  position   MMT Right Eval Left Eval  Hip flexion 4- 4-  Hip extension    Hip abduction 4 4  Hip adduction 4 4  Hip internal rotation    Hip external rotation    Knee flexion 4 4  Knee extension 4- 4-  Ankle dorsiflexion 4 4  Ankle plantarflexion    Ankle inversion    Ankle eversion    (Blank rows = not tested)  BED MOBILITY:  Pt reports she has been sleeping on the couch due to difficulty sleeping and difficulty getting into/out of the bed   TRANSFERS: Assistive device utilized: Environmental consultant - 2 wheeled  Sit to stand: SBA Stand to sit: SBA Chair to chair: SBA Very wide BOS and difficulty coordinating steps    GAIT: Gait pattern: step through pattern, decreased stride length, lateral hip instability, and wide BOS Distance walked: various clinic distances  Assistive device utilized: Environmental consultant - 2 wheeled Level of assistance: SBA Comments: Pt ambulates w/guarded-pattern and wide BOS   FUNCTIONAL TESTS:  Gait Speed: 2.2 ft/sec (2/17) BBS: 46/56 FGA: 11/30                                                                                                                             TREATMENT:    TherEx Trial of various exercises to add to HEP: Bridges x 10 reps with 5 sec hold SL clams x 10 reps B "  Hard" on R side, "medium" on L side Added to HEP, see bolded below  Physical Performance  [facility] PT Assessment - 03/24/23 1022       Ambulation/Gait   Gait velocity 32.8 ft over 14.88 sec = 2.2 ft/sec      Standardized Balance Assessment   Standardized Balance Assessment Berg Balance Test      Berg Balance Test   Sit to Stand Able to stand without using hands and stabilize independently    Standing Unsupported Able to stand 2 minutes with supervision    Sitting with Back Unsupported but Feet Supported on Floor or Stool Able to sit safely and securely 2 minutes    Stand to Sit Sits safely with minimal use of hands    Transfers Able to transfer safely, minor use of hands     Standing Unsupported with Eyes Closed Able to stand 10 seconds with supervision    Standing Unsupported with Feet Together Able to place feet together independently and stand for 1 minute with supervision    From Standing, Reach Forward with Outstretched Arm Can reach forward >12 cm safely (5")    From Standing Position, Pick up Object from Floor Able to pick up shoe, needs supervision    From Standing Position, Turn to Look Behind Over each Shoulder Looks behind from both sides and weight shifts well    Turn 360 Degrees Able to turn 360 degrees safely but slowly    Standing Unsupported, Alternately Place Feet on Step/Stool Able to stand independently and complete 8 steps >20 seconds    Standing Unsupported, One Foot in Front Able to take small step independently and hold 30 seconds    Standing on One Leg Able to lift leg independently and hold > 10 seconds    Total Score 46    Berg comment: 46/56, moderate fall risk      Functional Gait  Assessment   Gait assessed  Yes    Gait Level Surface Walks 20 ft, slow speed, abnormal gait pattern, evidence for imbalance or deviates 10-15 in outside of the 12 in walkway width. Requires more than 7 sec to ambulate 20 ft.    Change in Gait Speed Able to change speed, demonstrates mild gait deviations, deviates 6-10 in outside of the 12 in walkway width, or no gait deviations, unable to achieve a major change in velocity, or uses a change in velocity, or uses an assistive device.    Gait with Horizontal Head Turns Performs head turns with moderate changes in gait velocity, slows down, deviates 10-15 in outside 12 in walkway width but recovers, can continue to walk.    Gait with Vertical Head Turns Performs task with slight change in gait velocity (eg, minor disruption to smooth gait path), deviates 6 - 10 in outside 12 in walkway width or uses assistive device    Gait and Pivot Turn Pivot turns safely in greater than 3 sec and stops with no loss of balance, or  pivot turns safely within 3 sec and stops with mild imbalance, requires small steps to catch balance.    Step Over Obstacle Cannot perform without assistance.    Gait with Narrow Base of Support Ambulates less than 4 steps heel to toe or cannot perform without assistance.   10 steps but needs min A   Gait with Eyes Closed Walks 20 ft, slow speed, abnormal gait pattern, evidence for imbalance, deviates 10-15 in outside 12 in walkway width. Requires more than 9 sec  to ambulate 20 ft.    Ambulating Backwards Walks 20 ft, slow speed, abnormal gait pattern, evidence for imbalance, deviates 10-15 in outside 12 in walkway width.    Steps Two feet to a stair, must use rail.   no handrail but step to pattern   Total Score 11    FGA comment: 11/30             Gait pattern: decreased step length- Left, decreased stance time- Right, decreased hip/knee flexion- Right, decreased hip/knee flexion- Left, decreased ankle dorsiflexion- Right, decreased ankle dorsiflexion- Left, and wide BOS Distance walked: various clinic distances Assistive device utilized: None Level of assistance: Min A Comments: one LOB to the L but able to recover with min A   PATIENT EDUCATION: Education details: results of OM and functional implications; initial HEP Person educated: Patient Education method: Explanation, Demonstration, and Handouts Education comprehension: verbalized understanding, returned demonstration, and needs further education  HOME EXERCISE PROGRAM: Access Code: [email or web address] URL: [email or web address] Date: 03/24/2023 Prepared by: Peter Congo  Exercises - Supine Bridge  - 1 x daily - 7 x weekly - 3 sets - 10 reps - 5 sec hold - Clamshell  - 1 x daily - 7 x weekly - 3 sets - 10 reps  GOALS: Goals reviewed with patient? Yes  SHORT TERM GOALS: Target date: 04/15/2023    Pt will improve Berg score to 52/56 for decreased fall risk Baseline: 46/56 (2/17) Goal status: INITIAL  2.  Pt  will improve gait velocity to at least 2.75 ft/sec for improved gait efficiency and performance at Supervision level  Baseline: 2.2 ft/sec no AD min A (2/17) Goal status: INITIAL  3.  Pt will improve FGA to 19/30 for decreased fall risk  Baseline: 11/30 (2/17) Goal status: INITIAL   LONG TERM GOALS: Target date: 04/29/2023   Pt will improve Berg score to 56/56 for decreased fall risk Baseline: 46/56 (2/17) Goal status: INITIAL  2.  Pt will improve gait velocity to at least 3.0 ft/sec for improved gait efficiency and performance at mod I level  Baseline: 2.2 ft/sec no AD min A (2/17) Goal status: INITIAL  3.  Pt will improve 5 x STS to less than or equal to 12 seconds without UE support to demonstrate improved functional strength and transfer efficiency.  Baseline:  Goal status: INITIAL  4.  Pt will ambulate >200' on level indoor surfaces w/no AD at mod I level for improved independence Baseline: using RW Goal status: INITIAL  5.  Pt will improve FGA to 27/30 for decreased fall risk  Baseline: 11/30 (2/17) Goal status: INITIAL   ASSESSMENT:  CLINICAL IMPRESSION: Emphasis of skilled PT session on assessing Berg, gait speed, and FGA to further obtain objective measures to assess balance and fall risk. Pt does exhibit an increased fall risk based on score of 46/56 on Berg, gait speed of 2.2 ft/sec, and FGA score of 11/30. Pt exhibits most balance impairments with gait with horizontal and vertical head turns, with narrow BOS, stepping over obstacles, and with eyes closed due to neuropathy. She also exhibits decreased B hip strength (R>L) and benefits from strengthening exercises added to her HEP. Pt continues to benefit from skilled PT services to work towards increasing her safety and independence with functional mobility. Continue POC.    OBJECTIVE IMPAIRMENTS: Abnormal gait, decreased activity tolerance, decreased balance, decreased coordination, decreased knowledge of condition,  decreased mobility, difficulty walking, decreased strength, impaired sensation, improper body mechanics, and pain.   ACTIVITY LIMITATIONS:  carrying, lifting, bending, sitting, standing, squatting, sleeping, stairs, transfers, bed mobility, bathing, hygiene/grooming, locomotion level, and caring for others  PARTICIPATION LIMITATIONS: cleaning, laundry, interpersonal relationship, driving, shopping, community activity, occupation, and yard work  PERSONAL FACTORS: Past/current experiences, Transportation, and 1 comorbidity: impaired sensation in BLEs  are also affecting patient's functional outcome.   REHAB POTENTIAL: Good  CLINICAL DECISION MAKING: Evolving/moderate complexity  EVALUATION COMPLEXITY: Moderate  PLAN:  PT FREQUENCY: 1-2x/week  PT DURATION: 6 weeks  PLANNED INTERVENTIONS: 97164- PT Re-evaluation, 97110-Therapeutic exercises, 97530- Therapeutic activity, 97112- Neuromuscular re-education, 97535- Self Care, 16109- Manual therapy, [phone]- Gait training, [phone]- Orthotic Fit/training, [phone]- Aquatic Therapy, [phone]- Electrical stimulation (manual), Patient/Family education, Balance training, Stair training, Dry Needling, Joint mobilization, Spinal mobilization, and DME instructions  PLAN FOR NEXT SESSION: add to HEP to work on functional strength of BLEs and balance deficits (hip strengthening, resist sit to stands, step taps/step ups and balance based on FGA and BBS deficits - EC, tandem, stepping over obstacles, head turns). Lumbar extension.    Peter Congo, PT Peter Congo, PT, DPT, CSRS  03/24/2023, 10:59 AM

## 2023-03-27 ENCOUNTER — Encounter: Payer: Self-pay | Admitting: Orthopedic Surgery

## 2023-03-27 ENCOUNTER — Other Ambulatory Visit: Payer: Self-pay | Admitting: Orthopedic Surgery

## 2023-03-27 DIAGNOSIS — R202 Paresthesia of skin: Secondary | ICD-10-CM

## 2023-03-27 DIAGNOSIS — M6281 Muscle weakness (generalized): Secondary | ICD-10-CM

## 2023-03-27 DIAGNOSIS — G629 Polyneuropathy, unspecified: Secondary | ICD-10-CM

## 2023-03-28 ENCOUNTER — Ambulatory Visit: Payer: PRIVATE HEALTH INSURANCE | Admitting: Physical Therapy

## 2023-03-28 DIAGNOSIS — R2681 Unsteadiness on feet: Secondary | ICD-10-CM

## 2023-03-28 DIAGNOSIS — R2689 Other abnormalities of gait and mobility: Secondary | ICD-10-CM

## 2023-03-28 DIAGNOSIS — M5459 Other low back pain: Secondary | ICD-10-CM

## 2023-03-28 DIAGNOSIS — M6281 Muscle weakness (generalized): Secondary | ICD-10-CM

## 2023-03-28 NOTE — Therapy (Signed)
OUTPATIENT PHYSICAL THERAPY NEURO TREATMENT   Patient Name: [name] MRN: [id number] DOB:12/26/1985, 38 y.o., female Today's Date: 03/28/2023   PCP: Octavia Heir, NP REFERRING PROVIDER: Amador Cunas, PA-C  END OF SESSION:  PT End of Session - 03/28/23 1105     Visit Number 3    Number of Visits 13   Plus eval   Date for PT Re-Evaluation 05/13/23    Authorization Type [employer]    PT Start Time 1103    PT Stop Time 1145    PT Time Calculation (min) 42 min    Equipment Utilized During Treatment Gait belt    Activity Tolerance Patient tolerated treatment well    Behavior During Therapy WFL for tasks assessed/performed               Past Medical History:  Diagnosis Date   Anemia    Anxiety    Back pain    Constipation    Hashimoto's disease    History of chicken pox    HSV infection    Joint pain    Lactose intolerance    Lower back pain    Palpitations    PCOS (polycystic ovarian syndrome)    PCOS (polycystic ovarian syndrome)    Right hip pain    SOBOE (shortness of breath on exertion)    Thyroid condition    Velamentous insertion of umbilical cord    Vitamin D deficiency    Vitiligo    Past Surgical History:  Procedure Laterality Date   NO PAST SURGERIES     PERINEAL LACERATION REPAIR N/A 03/01/2020   Procedure: SUTURE REPAIR PERINEAL LACERATION;  Surgeon: Candice Camp, MD;  Location: [facility];  Service: Gynecology;  Laterality: N/A;   Patient Active Problem List   Diagnosis Date Noted   Gait abnormality 03/03/2023   Paresthesia 03/03/2023   Seasonal and perennial allergic rhinitis 12/30/2022   Seasonal allergic conjunctivitis 12/30/2022   Poor sleep 07/25/2022   Weight gain 07/25/2022   Pure hypercholesterolemia 03/20/2022   Generalized obesity with starting BMI 32 03/06/2022   Low back pain 03/06/2022   Vitamin D deficiency 03/06/2022   SOBOE (shortness of breath on exertion) 03/06/2022   Other fatigue 03/06/2022   Anxiety 07/31/2021    Gastroesophageal reflux disease without esophagitis 07/31/2021   Genital herpes simplex 07/31/2021   History of abnormal cervical Papanicolaou smear 07/31/2021   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   Subclinical hyperthyroidism 07/31/2021   Twin pregnancy 07/31/2021   Polycystic ovary syndrome 07/31/2021   Hashimoto's disease 05/21/2021   Thyroid antibody positive 01/24/2021   Vitiligo 01/24/2021   Pregnancy 02/29/2020   Vaginal tear resulting from childbirth 02/29/2020   Hyperandrogenemia 02/04/2012   PCOS (polycystic ovarian syndrome) 02/04/2012    ONSET DATE: 03/12/2023 (referral)   REFERRING DIAG: R29.898 (ICD-10-CM) - Bilateral leg weakness R20.2 (ICD-10-CM) - Paresthesia  THERAPY DIAG:  Other abnormalities of gait and mobility  Unsteadiness on feet  Muscle weakness (generalized)  Other low back pain  Rationale for Evaluation and Treatment: Rehabilitation  SUBJECTIVE:  SUBJECTIVE STATEMENT:  Pt enters clinic today with her SPC. Reports she saw her pelvic health PT and she is getting set up with another pelvic therapist that specializes in urology. Has a referral for PM&R, will call next week if she does not hear back.   Tingling is the worst in her legs from her thighs to her knees. Will start to feel better until she goes to bed, not sure if the way she is sleeping is worsening things (she sleeps on her L side w/a pillow between her knees)   Pooping is better, pt is able to have BM every few days without medicine. Stool is solid.    Per PT eval: Pt presents w/RW and reports she has been using it for a few weeks. 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 Pt did not think much of it at the time, but by the next day, it had spread to  her LLE and up to her umbilicus. Pt reports she has a constant burning sensation distal to her umbilicus and did have foot drop but her strength is slowly returning. States she feels like she has an egg in her anus and it is extremely uncomfortable to sit. Reports she feels like she has a tourniquet on her legs and tape wrapped around her ankles. BP fluctuates at times and she has random clamminess and sweatiness. Unsure if this is due to blood glucose. Has had multiple imaging and tests performed, including MRI, lumbar puncture and EMG. All came back normal.    Pt accompanied by: self  PERTINENT HISTORY: None relevant on file   PAIN:  Are you having pain? No Pt reports feeling "contracted" every where and a constant burning from her waist down   PRECAUTIONS: Fall  RED FLAGS: None   WEIGHT BEARING RESTRICTIONS: No  FALLS: Has patient fallen in last 6 months? No  LIVING ENVIRONMENT: Lives with: lives with their partner and lives with their daughter Lives in: House/apartment Stairs: Yes: External: 2 steps; none Has following equipment at home: Environmental consultant - 2 wheeled and Tour manager  PLOF: Independent  PATIENT GOALS: "To improve strength and balance the best that I can"   OBJECTIVE:  Note: Objective measures were completed at Evaluation unless otherwise noted.  DIAGNOSTIC FINDINGS: MRI of lumbar spine from 02/27/23  IMPRESSION: 1. No evidence of demyelinating disease in the thoracic spine. 2. No evidence of high-grade spinal canal or neural foraminal stenosis. 3. Eccentric left disc bulge with a superimposed left paracentral disc protrusion at L5-S1 that narrows the left lateral recess and could affect the descending left S1 nerve root.  MRI of brain from 03/02/23  IMPRESSION: Normal MRI appearance of the Brain.   MRI of thoracic spine from 02/27/23  IMPRESSION: 1. No evidence of demyelinating disease in the thoracic spine. 2. No evidence of high-grade spinal canal or  neural foraminal stenosis. 3. Eccentric left disc bulge with a superimposed left paracentral disc protrusion at L5-S1 that narrows the left lateral recess and could affect the descending left S1 nerve root.  EMG of lower extremity 03/11/23 Conclusion: This is a normal study.  There is no electrodiagnostic evidence of large fiber peripheral neuropathy, or intrinsic muscle disease.  COGNITION: Overall cognitive status: Within functional limits for tasks assessed   SENSATION: Pt reports constant burning/tingling from the waist down   POSTURE: No Significant postural limitations  LOWER EXTREMITY ROM:  Active  Right Eval Left Eval  Hip flexion    Hip extension    Hip abduction    Hip adduction    Hip internal rotation    Hip external rotation    Knee flexion    Knee extension    Ankle dorsiflexion    Ankle plantarflexion    Ankle inversion    Ankle eversion     (Blank rows = not tested)  LOWER EXTREMITY MMT:  Tested in seated position   MMT Right Eval Left Eval  Hip flexion 4- 4-  Hip extension    Hip abduction 4 4  Hip adduction 4 4  Hip internal rotation    Hip external rotation    Knee flexion 4 4  Knee extension 4- 4-  Ankle dorsiflexion 4 4  Ankle plantarflexion    Ankle inversion    Ankle eversion    (Blank rows = not tested)  BED MOBILITY:  Pt reports she has been sleeping on the couch due to difficulty sleeping and difficulty getting into/out of the bed   TRANSFERS: Assistive device utilized: Environmental consultant - 2 wheeled  Sit to stand: SBA Stand to sit: SBA Chair to chair: SBA Very wide BOS and difficulty coordinating steps    GAIT: Gait pattern: step through pattern, decreased stride length, lateral hip instability, and wide BOS Distance walked: various clinic distances  Assistive device utilized: Environmental consultant - 2 wheeled Level of assistance: SBA Comments: Pt ambulates w/guarded-pattern and wide BOS   FUNCTIONAL TESTS:  Gait Speed: 2.2 ft/sec  (2/17) BBS: 46/56 FGA: 11/30                                                                                                                             TREATMENT:   Ther Act  The following were performed for improved glute med, quad, hip adductor and core strength as well as spinal mobility:  Monster walks in fwd/retro and lateral directions w/intermittent UE support, x3 reps each direction. Used red theraband around distal quads. Min cues to avoid sliding foot on ground and noted increased difficulty picking up L foot > R. Added to HEP (see bolded below)  Curtsy lunges w/BUE support, x7 reps per side. Pt able to safely lower down but had increased difficulty using quads to push up. CGA for safety  Squats w/yellow theraball squeezes and BUE support on ballet bar, x12 reps. Pt very challenged by this and some trembling of BLEs noted  On mat table, quadruped cat cows x8 reps. No limited mobility noted but pt reported tightness in R paraspinals, not TTP. Added to HEP (see bolded below)  Child's pose hold, x60s. Pt reported feeling reduction in numbness/tingling when in child's pose but did increase tingling w/anterior pelvic tilt when transitioning to quadruped. Added to HEP (see bolded below)    Gait pattern: decreased step length- Left, decreased stance time- Right, decreased hip/knee flexion- Right, decreased hip/knee flexion- Left, decreased ankle dorsiflexion- Right,  decreased ankle dorsiflexion- Left, Right steppage, and wide BOS Distance walked: various clinic distances Assistive device utilized: None Level of assistance: CGA Comments: No LOB this date but pt demonstrates steppage gait on RLE w/more narrow BOS.    PATIENT EDUCATION: Education details: Additions to HEP, working on gentle spinal extension at home  Person educated: Patient Education method: Programmer, multimedia, Facilities manager, and Handouts Education comprehension: verbalized understanding, returned demonstration, and needs further  education  HOME EXERCISE PROGRAM: Access Code: [email or web address] URL: [email or web address] Date: 03/24/2023 Prepared by: Peter Congo  Exercises - Supine Bridge  - 1 x daily - 7 x weekly - 3 sets - 10 reps - 5 sec hold - Clamshell  - 1 x daily - 7 x weekly - 3 sets - 10 reps - Side Stepping with Resistance at Thighs and Counter Support  - 1 x daily - 7 x weekly - 3 sets - 10 reps - Forward Backward Monster Walk with Band at Thighs and Counter Support  - 1 x daily - 7 x weekly - 3 sets - 10 reps - Cat Cow to Child's Pose  - 1 x daily - 7 x weekly - 3 sets - 10 reps  GOALS: Goals reviewed with patient? Yes  SHORT TERM GOALS: Target date: 04/15/2023    Pt will improve Berg score to 52/56 for decreased fall risk Baseline: 46/56 (2/17) Goal status: INITIAL  2.  Pt will improve gait velocity to at least 2.75 ft/sec for improved gait efficiency and performance at Supervision level  Baseline: 2.2 ft/sec no AD min A (2/17) Goal status: INITIAL  3.  Pt will improve FGA to 19/30 for decreased fall risk  Baseline: 11/30 (2/17) Goal status: INITIAL   LONG TERM GOALS: Target date: 04/29/2023   Pt will improve Berg score to 56/56 for decreased fall risk Baseline: 46/56 (2/17) Goal status: INITIAL  2.  Pt will improve gait velocity to at least 3.0 ft/sec for improved gait efficiency and performance at mod I level  Baseline: 2.2 ft/sec no AD min A (2/17) Goal status: INITIAL  3.  Pt will improve 5 x STS to less than or equal to 12 seconds without UE support to demonstrate improved functional strength and transfer efficiency.  Baseline:  Goal status: INITIAL  4.  Pt will ambulate >200' on level indoor surfaces w/no AD at mod I level for improved independence Baseline: using RW Goal status: INITIAL  5.  Pt will improve FGA to 27/30 for decreased fall risk  Baseline: 11/30 (2/17) Goal status: INITIAL   ASSESSMENT:  CLINICAL IMPRESSION: Emphasis of skilled PT session on  functional BLE strength and introduction to spinal extension. Pt not using RW this date and reports fluctuations in stability, but was walking w/more narrow BOS today compared to previous session. Pt denied increase in symptoms w/lumbar extension, but had increase w/anterior pelvic tilt. Pt continues to report tightness along R paraspinals, but did not palpate increased tone compared to L side. Pt did receive referral to PM&R and will follow up next week to have appointment made. Continue POC.    OBJECTIVE IMPAIRMENTS: Abnormal gait, decreased activity tolerance, decreased balance, decreased coordination, decreased knowledge of condition, decreased mobility, difficulty walking, decreased strength, impaired sensation, improper body mechanics, and pain.   ACTIVITY LIMITATIONS: carrying, lifting, bending, sitting, standing, squatting, sleeping, stairs, transfers, bed mobility, bathing, hygiene/grooming, locomotion level, and caring for others  PARTICIPATION LIMITATIONS: cleaning, laundry, interpersonal relationship, driving, shopping, community activity, occupation, and yard work  PERSONAL FACTORS: Past/current experiences,  Transportation, and 1 comorbidity: impaired sensation in BLEs  are also affecting patient's functional outcome.   REHAB POTENTIAL: Good  CLINICAL DECISION MAKING: Evolving/moderate complexity  EVALUATION COMPLEXITY: Moderate  PLAN:  PT FREQUENCY: 1-2x/week  PT DURATION: 6 weeks  PLANNED INTERVENTIONS: [id number]- PT Re-evaluation, 97110-Therapeutic exercises, 97530- Therapeutic activity, 97112- Neuromuscular re-education, 97535- Self Care, [id number]- Manual therapy, [phone]- Gait training, [phone]- Orthotic Fit/training, [phone]- Aquatic Therapy, [phone]- Electrical stimulation (manual), Patient/Family education, Balance training, Stair training, Dry Needling, Joint mobilization, Spinal mobilization, and DME instructions  PLAN FOR NEXT SESSION: add to HEP to work on functional strength of  BLEs and balance deficits (hip strengthening, resist sit to stands, step taps/step ups and balance based on FGA and BBS deficits - EC, tandem, stepping over obstacles, head turns). Lumbar extension. Core    Jill Alexanders Ahriyah Vannest, PT, DPT 03/28/2023, 11:52 AM

## 2023-03-31 ENCOUNTER — Ambulatory Visit: Payer: PRIVATE HEALTH INSURANCE | Admitting: Physical Therapy

## 2023-03-31 DIAGNOSIS — R2689 Other abnormalities of gait and mobility: Secondary | ICD-10-CM

## 2023-03-31 DIAGNOSIS — M6281 Muscle weakness (generalized): Secondary | ICD-10-CM

## 2023-03-31 DIAGNOSIS — R2681 Unsteadiness on feet: Secondary | ICD-10-CM

## 2023-03-31 NOTE — Therapy (Signed)
 OUTPATIENT PHYSICAL THERAPY NEURO TREATMENT   Patient Name: [name] MRN: [id number] DOB:October 22, 1985, 38 y.o., female Today's Date: 03/31/2023   PCP: Octavia Heir, NP REFERRING PROVIDER: Amador Cunas, PA-C  END OF SESSION:  PT End of Session - 03/31/23 1019     Visit Number 4    Number of Visits 13   Plus eval   Date for PT Re-Evaluation 05/13/23    Authorization Type [employer]    PT Start Time 1017    PT Stop Time 1105    PT Time Calculation (min) 48 min    Equipment Utilized During Treatment --    Activity Tolerance Patient tolerated treatment well    Behavior During Therapy WFL for tasks assessed/performed                Past Medical History:  Diagnosis Date   Anemia    Anxiety    Back pain    Constipation    Hashimoto's disease    History of chicken pox    HSV infection    Joint pain    Lactose intolerance    Lower back pain    Palpitations    PCOS (polycystic ovarian syndrome)    PCOS (polycystic ovarian syndrome)    Right hip pain    SOBOE (shortness of breath on exertion)    Thyroid condition    Velamentous insertion of umbilical cord    Vitamin D deficiency    Vitiligo    Past Surgical History:  Procedure Laterality Date   NO PAST SURGERIES     PERINEAL LACERATION REPAIR N/A 03/01/2020   Procedure: SUTURE REPAIR PERINEAL LACERATION;  Surgeon: Candice Camp, MD;  Location: [facility];  Service: Gynecology;  Laterality: N/A;   Patient Active Problem List   Diagnosis Date Noted   Gait abnormality 03/03/2023   Paresthesia 03/03/2023   Seasonal and perennial allergic rhinitis 12/30/2022   Seasonal allergic conjunctivitis 12/30/2022   Poor sleep 07/25/2022   Weight gain 07/25/2022   Pure hypercholesterolemia 03/20/2022   Generalized obesity with starting BMI 32 03/06/2022   Low back pain 03/06/2022   Vitamin D deficiency 03/06/2022   SOBOE (shortness of breath on exertion) 03/06/2022   Other fatigue 03/06/2022   Anxiety 07/31/2021    Gastroesophageal reflux disease without esophagitis 07/31/2021   Genital herpes simplex 07/31/2021   History of abnormal cervical Papanicolaou smear 07/31/2021   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   Subclinical hyperthyroidism 07/31/2021   Twin pregnancy 07/31/2021   Polycystic ovary syndrome 07/31/2021   Hashimoto's disease 05/21/2021   Thyroid antibody positive 01/24/2021   Vitiligo 01/24/2021   Pregnancy 02/29/2020   Vaginal tear resulting from childbirth 02/29/2020   Hyperandrogenemia 02/04/2012   PCOS (polycystic ovarian syndrome) 02/04/2012    ONSET DATE: 03/12/2023 (referral)   REFERRING DIAG: R29.898 (ICD-10-CM) - Bilateral leg weakness R20.2 (ICD-10-CM) - Paresthesia  THERAPY DIAG:  Other abnormalities of gait and mobility  Unsteadiness on feet  Muscle weakness (generalized)  Rationale for Evaluation and Treatment: Rehabilitation  SUBJECTIVE:  SUBJECTIVE STATEMENT:  Pt enters clinic today with her SPC. Reports she saw urology PTA this weekend and was told she is inflamed and retaining fluid. [place] but she did not fall. Has been working on her cat cows and child's pose, likes child's pose. Cat cow does not make her pins and needles worse but also does not help them. No pain today.     Per PT eval: Pt presents w/RW and reports she has been using it for a few weeks. 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 Pt did not think much of it at the time, but by the next day, it had spread to her LLE and up to her umbilicus. Pt reports she has a constant burning sensation distal to her umbilicus and did have foot drop but her strength is slowly returning. States she feels like she has an egg in her  anus and it is extremely uncomfortable to sit. Reports she feels like she has a tourniquet on her legs and tape wrapped around her ankles. BP fluctuates at times and she has random clamminess and sweatiness. Unsure if this is due to blood glucose. Has had multiple imaging and tests performed, including MRI, lumbar puncture and EMG. All came back normal.    Pt accompanied by: self  PERTINENT HISTORY: None relevant on file   PAIN:  Are you having pain? No Pt reports feeling "contracted" every where and a constant burning from her waist down   PRECAUTIONS: Fall  RED FLAGS: None   WEIGHT BEARING RESTRICTIONS: No  FALLS: Has patient fallen in last 6 months? No  LIVING ENVIRONMENT: Lives with: lives with their partner and lives with their daughter Lives in: House/apartment Stairs: Yes: External: 2 steps; none Has following equipment at home: Environmental consultant - 2 wheeled and Tour manager  PLOF: Independent  PATIENT GOALS: "To improve strength and balance the best that I can"   OBJECTIVE:  Note: Objective measures were completed at Evaluation unless otherwise noted.  DIAGNOSTIC FINDINGS: MRI of lumbar spine from 02/27/23  IMPRESSION: 1. No evidence of demyelinating disease in the thoracic spine. 2. No evidence of high-grade spinal canal or neural foraminal stenosis. 3. Eccentric left disc bulge with a superimposed left paracentral disc protrusion at L5-S1 that narrows the left lateral recess and could affect the descending left S1 nerve root.  MRI of brain from 03/02/23  IMPRESSION: Normal MRI appearance of the Brain.   MRI of thoracic spine from 02/27/23  IMPRESSION: 1. No evidence of demyelinating disease in the thoracic spine. 2. No evidence of high-grade spinal canal or neural foraminal stenosis. 3. Eccentric left disc bulge with a superimposed left paracentral disc protrusion at L5-S1 that narrows the left lateral recess and could affect the descending left S1 nerve  root.  EMG of lower extremity 03/11/23 Conclusion: This is a normal study.  There is no electrodiagnostic evidence of large fiber peripheral neuropathy, or intrinsic muscle disease.  COGNITION: Overall cognitive status: Within functional limits for tasks assessed   SENSATION: Pt reports constant burning/tingling from the waist down   POSTURE: No Significant postural limitations  LOWER EXTREMITY ROM:     Active  Right Eval Left Eval  Hip flexion    Hip extension    Hip abduction    Hip adduction    Hip internal rotation    Hip external rotation  Knee flexion    Knee extension    Ankle dorsiflexion    Ankle plantarflexion    Ankle inversion    Ankle eversion     (Blank rows = not tested)  LOWER EXTREMITY MMT:  Tested in seated position   MMT Right Eval Left Eval  Hip flexion 4- 4-  Hip extension    Hip abduction 4 4  Hip adduction 4 4  Hip internal rotation    Hip external rotation    Knee flexion 4 4  Knee extension 4- 4-  Ankle dorsiflexion 4 4  Ankle plantarflexion    Ankle inversion    Ankle eversion    (Blank rows = not tested)  BED MOBILITY:  Pt reports she has been sleeping on the couch due to difficulty sleeping and difficulty getting into/out of the bed   TRANSFERS: Assistive device utilized: Environmental consultant - 2 wheeled  Sit to stand: SBA Stand to sit: SBA Chair to chair: SBA Very wide BOS and difficulty coordinating steps    GAIT: Gait pattern: step through pattern, decreased stride length, lateral hip instability, and wide BOS Distance walked: various clinic distances  Assistive device utilized: Environmental consultant - 2 wheeled Level of assistance: SBA Comments: Pt ambulates w/guarded-pattern and wide BOS   FUNCTIONAL TESTS:  Gait Speed: 2.2 ft/sec (2/17) BBS: 46/56 FGA: 11/30                                                                                                                             TREATMENT:   Ther Act  The following were performed for  improved core stability, hip flexor/quad strength and functional hip strength:  Quadruped bird dogs on mat table, x8 reps per side. Increased difficulty stabilizing on RLE but no LOB noted.  Prone press ups w/10s hold, x6 reps. Pt reported reduced numbness in low back w/lumbar extension, but no reduction in symptoms in legs.   Dead bugs w/BLEs only, x8 reps per side w/PPT. Pt very challenged by this, especially on R side but was able to maintain PPT well  Single leg bridges x5 per side. Increased difficulty stabilizing on RLE  Progressed to x5 per side w/yellow theraball squeezes for added adductor challenge. Pt unable to lift hips well when performing on R side  Standing pallof presses w/orange resistance band, x14 reps per side. Noted trembling of RLE throughout activity and lateral lean preference to R side.  Long-sitting march overs using 10# KB lying on side, x10 reps per side. Significant difficulty performing on R side as pt could not coordinate movement well. However, w/practice, pt able to perform more smoothly on R side.    Gait pattern: decreased step length- Left, decreased stance time- Right, decreased hip/knee flexion- Right, decreased hip/knee flexion- Left, decreased ankle dorsiflexion- Right, decreased ankle dorsiflexion- Left, Right steppage, and wide BOS Distance walked: various clinic distances Assistive device utilized: None Level of assistance: CGA Comments: No LOB this date but pt demonstrates  steppage gait on RLE w/more narrow BOS.    PATIENT EDUCATION: Education details: Continue HEP  Person educated: Patient Education method: Explanation Education comprehension: verbalized understanding  HOME EXERCISE PROGRAM: Access Code: [email or web address] URL: [email or web address] Date: 03/24/2023 Prepared by: Peter Congo  Exercises - Supine Bridge  - 1 x daily - 7 x weekly - 3 sets - 10 reps - 5 sec hold - Clamshell  - 1 x daily - 7 x weekly - 3 sets - 10 reps -  Side Stepping with Resistance at Thighs and Counter Support  - 1 x daily - 7 x weekly - 3 sets - 10 reps - Forward Backward Monster Walk with Band at Thighs and Counter Support  - 1 x daily - 7 x weekly - 3 sets - 10 reps - Cat Cow to Child's Pose  - 1 x daily - 7 x weekly - 3 sets - 10 reps  GOALS: Goals reviewed with patient? Yes  SHORT TERM GOALS: Target date: 04/15/2023    Pt will improve Berg score to 52/56 for decreased fall risk Baseline: 46/56 (2/17) Goal status: INITIAL  2.  Pt will improve gait velocity to at least 2.75 ft/sec for improved gait efficiency and performance at Supervision level  Baseline: 2.2 ft/sec no AD min A (2/17) Goal status: INITIAL  3.  Pt will improve FGA to 19/30 for decreased fall risk  Baseline: 11/30 (2/17) Goal status: INITIAL   LONG TERM GOALS: Target date: 04/29/2023   Pt will improve Berg score to 56/56 for decreased fall risk Baseline: 46/56 (2/17) Goal status: INITIAL  2.  Pt will improve gait velocity to at least 3.0 ft/sec for improved gait efficiency and performance at mod I level  Baseline: 2.2 ft/sec no AD min A (2/17) Goal status: INITIAL  3.  Pt will improve 5 x STS to less than or equal to 12 seconds without UE support to demonstrate improved functional strength and transfer efficiency.  Baseline:  Goal status: INITIAL  4.  Pt will ambulate >200' on level indoor surfaces w/no AD at mod I level for improved independence Baseline: using RW Goal status: INITIAL  5.  Pt will improve FGA to 27/30 for decreased fall risk  Baseline: 11/30 (2/17) Goal status: INITIAL   ASSESSMENT:  CLINICAL IMPRESSION: Emphasis of skilled PT session on functional core stability, hip stability and lumbar extension. Pt reports she does feel a bit more confident w/walking but still trips often and fatigues quickly. Pt did notice reduction of symptoms in low back w/lumbar extension, but no difference in symptoms of feet/lower legs. Pt very  challenged by hip flexor strength in long sit position and demonstrates reduced coordination of RLE > LLE. Continue POC.    OBJECTIVE IMPAIRMENTS: Abnormal gait, decreased activity tolerance, decreased balance, decreased coordination, decreased knowledge of condition, decreased mobility, difficulty walking, decreased strength, impaired sensation, improper body mechanics, and pain.   ACTIVITY LIMITATIONS: carrying, lifting, bending, sitting, standing, squatting, sleeping, stairs, transfers, bed mobility, bathing, hygiene/grooming, locomotion level, and caring for others  PARTICIPATION LIMITATIONS: cleaning, laundry, interpersonal relationship, driving, shopping, community activity, occupation, and yard work  PERSONAL FACTORS: Past/current experiences, Transportation, and 1 comorbidity: impaired sensation in BLEs  are also affecting patient's functional outcome.   REHAB POTENTIAL: Good  CLINICAL DECISION MAKING: Evolving/moderate complexity  EVALUATION COMPLEXITY: Moderate  PLAN:  PT FREQUENCY: 1-2x/week  PT DURATION: 6 weeks  PLANNED INTERVENTIONS: 97164- PT Re-evaluation, 97110-Therapeutic exercises, 97530- Therapeutic activity, 97112- Neuromuscular re-education, 97535- Self Care, 16109- Manual therapy,  40981- Gait training, 19147- Orthotic Fit/training, 82956- Aquatic Therapy, [phone]- Electrical stimulation (manual), Patient/Family education, Balance training, Stair training, Dry Needling, Joint mobilization, Spinal mobilization, and DME instructions  PLAN FOR NEXT SESSION: add to HEP to work on functional strength of BLEs and balance deficits (hip strengthening, resist sit to stands, step taps/step ups and balance based on FGA and BBS deficits - EC, tandem, stepping over obstacles, head turns). Lumbar extension. Core    Jill Alexanders Rasul Decola, PT, DPT 03/31/2023, 11:54 AM

## 2023-04-02 ENCOUNTER — Encounter: Payer: Self-pay | Admitting: Physical Medicine and Rehabilitation

## 2023-04-02 ENCOUNTER — Telehealth: Payer: Self-pay | Admitting: Neurology

## 2023-04-02 ENCOUNTER — Ambulatory Visit: Payer: PRIVATE HEALTH INSURANCE | Admitting: Physical Therapy

## 2023-04-02 DIAGNOSIS — R2681 Unsteadiness on feet: Secondary | ICD-10-CM

## 2023-04-02 DIAGNOSIS — M6281 Muscle weakness (generalized): Secondary | ICD-10-CM

## 2023-04-02 DIAGNOSIS — R2689 Other abnormalities of gait and mobility: Secondary | ICD-10-CM

## 2023-04-02 NOTE — Telephone Encounter (Signed)
 Dr. Terrace Arabia ordered a lumbar puncture for this patient. The patient went to the ER 1/26 and the hospital did one then. The imaging facility advised the patient to contact Dr. Terrace Arabia to see if the one she ordered is till needed.

## 2023-04-02 NOTE — Therapy (Signed)
 OUTPATIENT PHYSICAL THERAPY NEURO TREATMENT   Patient Name: [name] MRN: [id number] DOB:1985/04/05, 38 y.o., female Today's Date: 04/02/2023   PCP: Octavia Heir, NP REFERRING PROVIDER: Amador Cunas, PA-C  END OF SESSION:  PT End of Session - 04/02/23 1104     Visit Number 5    Number of Visits 13   Plus eval   Date for PT Re-Evaluation 05/13/23    Authorization Type [employer]    PT Start Time 1104    PT Stop Time 1145    PT Time Calculation (min) 41 min    Activity Tolerance Patient tolerated treatment well    Behavior During Therapy WFL for tasks assessed/performed                 Past Medical History:  Diagnosis Date   Anemia    Anxiety    Back pain    Constipation    Hashimoto's disease    History of chicken pox    HSV infection    Joint pain    Lactose intolerance    Lower back pain    Palpitations    PCOS (polycystic ovarian syndrome)    PCOS (polycystic ovarian syndrome)    Right hip pain    SOBOE (shortness of breath on exertion)    Thyroid condition    Velamentous insertion of umbilical cord    Vitamin D deficiency    Vitiligo    Past Surgical History:  Procedure Laterality Date   NO PAST SURGERIES     PERINEAL LACERATION REPAIR N/A 03/01/2020   Procedure: SUTURE REPAIR PERINEAL LACERATION;  Surgeon: Candice Camp, MD;  Location: [facility];  Service: Gynecology;  Laterality: N/A;   Patient Active Problem List   Diagnosis Date Noted   Gait abnormality 03/03/2023   Paresthesia 03/03/2023   Seasonal and perennial allergic rhinitis 12/30/2022   Seasonal allergic conjunctivitis 12/30/2022   Poor sleep 07/25/2022   Weight gain 07/25/2022   Pure hypercholesterolemia 03/20/2022   Generalized obesity with starting BMI 32 03/06/2022   Low back pain 03/06/2022   Vitamin D deficiency 03/06/2022   SOBOE (shortness of breath on exertion) 03/06/2022   Other fatigue 03/06/2022   Anxiety 07/31/2021   Gastroesophageal reflux disease without  esophagitis 07/31/2021   Genital herpes simplex 07/31/2021   History of abnormal cervical Papanicolaou smear 07/31/2021   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   Subclinical hyperthyroidism 07/31/2021   Twin pregnancy 07/31/2021   Polycystic ovary syndrome 07/31/2021   Hashimoto's disease 05/21/2021   Thyroid antibody positive 01/24/2021   Vitiligo 01/24/2021   Pregnancy 02/29/2020   Vaginal tear resulting from childbirth 02/29/2020   Hyperandrogenemia 02/04/2012   PCOS (polycystic ovarian syndrome) 02/04/2012    ONSET DATE: 03/12/2023 (referral)   REFERRING DIAG: R29.898 (ICD-10-CM) - Bilateral leg weakness R20.2 (ICD-10-CM) - Paresthesia  THERAPY DIAG:  Other abnormalities of gait and mobility  Unsteadiness on feet  Muscle weakness (generalized)  Rationale for Evaluation and Treatment: Rehabilitation  SUBJECTIVE:  SUBJECTIVE STATEMENT: Pt reports that she drove here today for the first time! Pt reports that she was extra cautious and aware of her R foot while driving, first time driving in 4 weeks. Pt reports that her body still is thrown off by complex movements. Pt does feel like she has gotten stronger in regards to walking, she also went down her back 4 steps (no handrails) into her backyard for the first time in 4 weeks!  Pt reports that her biggest struggle continues to be constipation, when she is constipated it feels like her abdomen seizes up when she is straining. Pt does continue to see a pelvic floor PT as well. Pt reports that she continues to have a lingering impaired sensation in her BLE but now just from knees down.  Pt was also able to schedule with PM&R, sees Dr. Shearon Stalls on 4/7.   Per PT eval: Pt presents w/RW and reports she has been using it for a few weeks. 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 Pt did not think much of it at the time, but by the next day, it had spread to her LLE and up to her umbilicus. Pt reports she has a constant burning sensation distal to her umbilicus and did have foot drop but her strength is slowly returning. States she feels like she has an egg in her anus and it is extremely uncomfortable to sit. Reports she feels like she has a tourniquet on her legs and tape wrapped around her ankles. BP fluctuates at times and she has random clamminess and sweatiness. Unsure if this is due to blood glucose. Has had multiple imaging and tests performed, including MRI, lumbar puncture and EMG. All came back normal.    Pt accompanied by: self  PERTINENT HISTORY: None relevant on file   PAIN:  Are you having pain? No Pt reports feeling "contracted" every where and a constant burning from her waist down   PRECAUTIONS: Fall  RED FLAGS: None   WEIGHT BEARING RESTRICTIONS: No  FALLS: Has patient fallen in last 6 months? No  LIVING ENVIRONMENT: Lives with: lives with their partner and lives with their daughter Lives in: House/apartment Stairs: Yes: External: 2 steps; none Has following equipment at home: Environmental consultant - 2 wheeled and Tour manager  PLOF: Independent  PATIENT GOALS: "To improve strength and balance the best that I can"   OBJECTIVE:  Note: Objective measures were completed at Evaluation unless otherwise noted.  DIAGNOSTIC FINDINGS: MRI of lumbar spine from 02/27/23  IMPRESSION: 1. No evidence of demyelinating disease in the thoracic spine. 2. No evidence of high-grade spinal canal or neural foraminal stenosis. 3. Eccentric left disc bulge with a superimposed left paracentral disc protrusion at L5-S1 that narrows the left lateral recess and could affect the descending left S1 nerve root.  MRI of brain from 03/02/23  IMPRESSION: Normal MRI appearance of the Brain.   MRI  of thoracic spine from 02/27/23  IMPRESSION: 1. No evidence of demyelinating disease in the thoracic spine. 2. No evidence of high-grade spinal canal or neural foraminal stenosis. 3. Eccentric left disc bulge with a superimposed left paracentral disc protrusion at L5-S1 that narrows the left lateral recess and could affect the descending left S1 nerve root.  EMG of lower extremity 03/11/23 Conclusion: This is a normal study.  There is no electrodiagnostic evidence of large fiber peripheral neuropathy, or intrinsic muscle disease.  COGNITION: Overall cognitive status: Within functional limits for tasks assessed   SENSATION: Pt reports constant burning/tingling from the waist down   POSTURE: No Significant postural limitations  LOWER EXTREMITY ROM:     Active  Right Eval Left Eval  Hip flexion    Hip extension    Hip abduction    Hip adduction    Hip internal rotation    Hip external rotation    Knee flexion    Knee extension    Ankle dorsiflexion    Ankle plantarflexion    Ankle inversion    Ankle eversion     (Blank rows = not tested)  LOWER EXTREMITY MMT:  Tested in seated position   MMT Right Eval Left Eval  Hip flexion 4- 4-  Hip extension    Hip abduction 4 4  Hip adduction 4 4  Hip internal rotation    Hip external rotation    Knee flexion 4 4  Knee extension 4- 4-  Ankle dorsiflexion 4 4  Ankle plantarflexion    Ankle inversion    Ankle eversion    (Blank rows = not tested)  BED MOBILITY:  Pt reports she has been sleeping on the couch due to difficulty sleeping and difficulty getting into/out of the bed   TRANSFERS: Assistive device utilized: Environmental consultant - 2 wheeled  Sit to stand: SBA Stand to sit: SBA Chair to chair: SBA Very wide BOS and difficulty coordinating steps    GAIT: Gait pattern: step through pattern, decreased stride length, lateral hip instability, and wide BOS Distance walked: various clinic distances  Assistive device utilized:  Environmental consultant - 2 wheeled Level of assistance: SBA Comments: Pt ambulates w/guarded-pattern and wide BOS   FUNCTIONAL TESTS:  Gait Speed: 2.2 ft/sec (2/17) BBS: 46/56 FGA: 11/30                                                                                                                             TREATMENT:    NMR The following exercises were performed for improved core stability, hip flexor/quad strength and mobility, and functional hip strength: Quadruped alt LE knee extension and hip flexion x 5 reps B Quadruped alt L/R mountain climbers x 10 reps B Prone R hip modified Thomas stretch + knee flexion for quad stretch 3 x 30 sec each with therapist assist Educated patient she can utilize a gait belt or strap around her R foot to perform stretch at home independently Half kneel R and L hip flexor stretch 3 x 30 sec each B Verbally added hip flexor stretches to HEP Half knee 4# weighted ball punchouts x 10 reps on RLE, unweighted ball 2 x 5 reps on LLE Pt with more difficulty balancing on LLE as compared to RLE despite increased weakness in her R side as compared to her L side Tall kneel sidesteps 3 x 5 ft L/R More difficulty with RLE as compared to LLE   PATIENT EDUCATION: Education details:  Continue HEP, verbally added to HEP Person educated: Patient Education method: Medical illustrator Education comprehension: verbalized understanding and returned demonstration  HOME EXERCISE PROGRAM: Access Code: [email or web address] URL: [email or web address] Date: 03/24/2023 Prepared by: Peter Congo  Exercises - Supine Bridge  - 1 x daily - 7 x weekly - 3 sets - 10 reps - 5 sec hold - Clamshell  - 1 x daily - 7 x weekly - 3 sets - 10 reps - Side Stepping with Resistance at Thighs and Counter Support  - 1 x daily - 7 x weekly - 3 sets - 10 reps - Forward Backward Monster Walk with Band at Thighs and Counter Support  - 1 x daily - 7 x weekly - 3 sets - 10 reps - Cat Cow to  Child's Pose  - 1 x daily - 7 x weekly - 3 sets - 10 reps  Verbally added modified thomas stretch and half kneel hip flexor stretch 2/26  GOALS: Goals reviewed with patient? Yes  SHORT TERM GOALS: Target date: 04/15/2023    Pt will improve Berg score to 52/56 for decreased fall risk Baseline: 46/56 (2/17) Goal status: INITIAL  2.  Pt will improve gait velocity to at least 2.75 ft/sec for improved gait efficiency and performance at Supervision level  Baseline: 2.2 ft/sec no AD min A (2/17) Goal status: INITIAL  3.  Pt will improve FGA to 19/30 for decreased fall risk  Baseline: 11/30 (2/17) Goal status: INITIAL   LONG TERM GOALS: Target date: 04/29/2023   Pt will improve Berg score to 56/56 for decreased fall risk Baseline: 46/56 (2/17) Goal status: INITIAL  2.  Pt will improve gait velocity to at least 3.0 ft/sec for improved gait efficiency and performance at mod I level  Baseline: 2.2 ft/sec no AD min A (2/17) Goal status: INITIAL  3.  Pt will improve 5 x STS to less than or equal to 12 seconds without UE support to demonstrate improved functional strength and transfer efficiency.  Baseline:  Goal status: INITIAL  4.  Pt will ambulate >200' on level indoor surfaces w/no AD at mod I level for improved independence Baseline: using RW Goal status: INITIAL  5.  Pt will improve FGA to 27/30 for decreased fall risk  Baseline: 11/30 (2/17) Goal status: INITIAL   ASSESSMENT:  CLINICAL IMPRESSION: Emphasis of skilled PT session on functional core stability, hip stability, and hip mobility. Pt is very challenged in half-kneel position initially but exhibits improved muscle control and stability as session progresses. Pt also exhibits increased B hip flexor tightness, improved following stretching but can benefit from TPDN in future sessions. Pt continues to benefit from skilled PT services to work towards improving her core and hip strength and stability in order to increase  her safety and independence with functional mobility. Continue POC.    OBJECTIVE IMPAIRMENTS: Abnormal gait, decreased activity tolerance, decreased balance, decreased coordination, decreased knowledge of condition, decreased mobility, difficulty walking, decreased strength, impaired sensation, improper body mechanics, and pain.   ACTIVITY LIMITATIONS: carrying, lifting, bending, sitting, standing, squatting, sleeping, stairs, transfers, bed mobility, bathing, hygiene/grooming, locomotion level, and caring for others  PARTICIPATION LIMITATIONS: cleaning, laundry, interpersonal relationship, driving, shopping, community activity, occupation, and yard work  PERSONAL FACTORS: Past/current experiences, Transportation, and 1 comorbidity: impaired sensation in BLEs  are also affecting patient's functional outcome.   REHAB POTENTIAL: Good  CLINICAL DECISION MAKING: Evolving/moderate complexity  EVALUATION COMPLEXITY: Moderate  PLAN:  PT FREQUENCY: 1-2x/week  PT DURATION: 6 weeks  PLANNED INTERVENTIONS: 97164- PT Re-evaluation, 97110-Therapeutic exercises, 97530- Therapeutic activity, 97112- Neuromuscular re-education, [phone]- Self Care, 60454- Manual therapy, [phone]- Gait training, [phone]- Orthotic Fit/training, [phone]- Aquatic Therapy, [phone]- Electrical stimulation (manual), Patient/Family education, Balance training, Stair training, Dry Needling, Joint mobilization, Spinal mobilization, and DME instructions  PLAN FOR NEXT SESSION: add to HEP to work on functional strength of BLEs and balance deficits (hip strengthening - SLS, lunges, resist sit to stands, step taps/step ups and balance based on FGA and BBS deficits - EC, tandem, stepping over obstacles, head turns). Lumbar extension. Core (planks? Superman, supine march); DN R hip flexors next visit with TT   Peter Congo, PT Peter Congo, PT, DPT, CSRS  04/02/2023, 11:54 AM

## 2023-04-02 NOTE — Telephone Encounter (Signed)
 No need to repeat lumbar puncture

## 2023-04-04 NOTE — Telephone Encounter (Signed)
 Paperwork is printed and placed on your desk

## 2023-04-06 ENCOUNTER — Encounter (HOSPITAL_BASED_OUTPATIENT_CLINIC_OR_DEPARTMENT_OTHER): Payer: Self-pay

## 2023-04-07 ENCOUNTER — Ambulatory Visit: Payer: PRIVATE HEALTH INSURANCE | Attending: Neurology | Admitting: Physical Therapy

## 2023-04-07 ENCOUNTER — Encounter: Payer: Self-pay | Admitting: Orthopedic Surgery

## 2023-04-07 VITALS — BP 118/95 | HR 97

## 2023-04-07 DIAGNOSIS — M6281 Muscle weakness (generalized): Secondary | ICD-10-CM | POA: Diagnosis present

## 2023-04-07 DIAGNOSIS — R2681 Unsteadiness on feet: Secondary | ICD-10-CM | POA: Insufficient documentation

## 2023-04-07 DIAGNOSIS — M5459 Other low back pain: Secondary | ICD-10-CM | POA: Insufficient documentation

## 2023-04-07 DIAGNOSIS — R2689 Other abnormalities of gait and mobility: Secondary | ICD-10-CM | POA: Diagnosis present

## 2023-04-07 NOTE — Therapy (Signed)
 OUTPATIENT PHYSICAL THERAPY NEURO TREATMENT   Patient Name: [name] MRN: [id number] DOB:[birth year], 38 y.o., female Today's Date: 04/07/2023   PCP: Octavia Heir, NP REFERRING PROVIDER: Amador Cunas, PA-C  END OF SESSION:  PT End of Session - 04/07/23 1021     Visit Number 6    Number of Visits 13   Plus eval   Date for PT Re-Evaluation 05/13/23    Authorization Type [employer]    PT Start Time 1018    PT Stop Time 1058    PT Time Calculation (min) 40 min    Activity Tolerance Patient tolerated treatment well    Behavior During Therapy WFL for tasks assessed/performed                 Past Medical History:  Diagnosis Date   Anemia    Anxiety    Back pain    Constipation    Hashimoto's disease    History of chicken pox    HSV infection    Joint pain    Lactose intolerance    Lower back pain    Palpitations    PCOS (polycystic ovarian syndrome)    PCOS (polycystic ovarian syndrome)    Right hip pain    SOBOE (shortness of breath on exertion)    Thyroid condition    Velamentous insertion of umbilical cord    Vitamin D deficiency    Vitiligo    Past Surgical History:  Procedure Laterality Date   NO PAST SURGERIES     PERINEAL LACERATION REPAIR N/A 03/01/2020   Procedure: SUTURE REPAIR PERINEAL LACERATION;  Surgeon: Candice Camp, MD;  Location: [facility] ORS;  Service: Gynecology;  Laterality: N/A;   Patient Active Problem List   Diagnosis Date Noted   Gait abnormality 03/03/2023   Paresthesia 03/03/2023   Seasonal and perennial allergic rhinitis 12/30/2022   Seasonal allergic conjunctivitis 12/30/2022   Poor sleep 07/25/2022   Weight gain 07/25/2022   Pure hypercholesterolemia 03/20/2022   Generalized obesity with starting BMI 32 03/06/2022   Low back pain 03/06/2022   Vitamin D deficiency 03/06/2022   SOBOE (shortness of breath on exertion) 03/06/2022   Other fatigue 03/06/2022   Anxiety 07/31/2021   Gastroesophageal reflux disease without  esophagitis 07/31/2021   Genital herpes simplex 07/31/2021   History of abnormal cervical Papanicolaou smear 07/31/2021   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   Subclinical hyperthyroidism 07/31/2021   Twin pregnancy 07/31/2021   Polycystic ovary syndrome 07/31/2021   Hashimoto's disease 05/21/2021   Thyroid antibody positive 01/24/2021   Vitiligo 01/24/2021   Pregnancy 02/29/2020   Vaginal tear resulting from childbirth 02/29/2020   Hyperandrogenemia 02/04/2012   PCOS (polycystic ovarian syndrome) 02/04/2012    ONSET DATE: 03/12/2023 (referral)   REFERRING DIAG: R29.898 (ICD-10-CM) - Bilateral leg weakness R20.2 (ICD-10-CM) - Paresthesia  THERAPY DIAG:  Other abnormalities of gait and mobility  Unsteadiness on feet  Muscle weakness (generalized)  Rationale for Evaluation and Treatment: Rehabilitation  SUBJECTIVE:  SUBJECTIVE STATEMENT: Pt reports that she has spent large part of the weekend researching what her symptoms may be alluding to and is concerned about transverse myelitis. Would like to have more blood tests and an updated lumbar MRI to rule this out.   Received dry needling on Thursday from pelvic health therapist to her low back and hip which did alleviate pain but did not change her symptoms.  Is concerned due to the "constricting" and "tourniquet" feeling affecting both legs evenly now and has not improved in 6 weeks.   Pt sees Dr. Shearon Stalls on 4/7 but plans on calling to see if she can be seen sooner.     Per PT eval: Pt presents w/RW and reports she has been using it for a few weeks. 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 Pt did not think much of it at the time, but by the next day, it had spread to her LLE and up to her  umbilicus. Pt reports she has a constant burning sensation distal to her umbilicus and did have foot drop but her strength is slowly returning. States she feels like she has an egg in her anus and it is extremely uncomfortable to sit. Reports she feels like she has a tourniquet on her legs and tape wrapped around her ankles. BP fluctuates at times and she has random clamminess and sweatiness. Unsure if this is due to blood glucose. Has had multiple imaging and tests performed, including MRI, lumbar puncture and EMG. All came back normal.    Pt accompanied by: self  PERTINENT HISTORY: None relevant on file   PAIN:  Are you having pain? No Pt reports feeling "contracted" every where and a constant burning from her waist down   PRECAUTIONS: Fall  RED FLAGS: None   WEIGHT BEARING RESTRICTIONS: No  FALLS: Has patient fallen in last 6 months? No  LIVING ENVIRONMENT: Lives with: lives with their partner and lives with their daughter Lives in: House/apartment Stairs: Yes: External: 2 steps; none Has following equipment at home: Environmental consultant - 2 wheeled and Tour manager  PLOF: Independent  PATIENT GOALS: "To improve strength and balance the best that I can"   OBJECTIVE:  Note: Objective measures were completed at Evaluation unless otherwise noted.  DIAGNOSTIC FINDINGS: MRI of lumbar spine from 02/27/23  IMPRESSION: 1. No evidence of demyelinating disease in the thoracic spine. 2. No evidence of high-grade spinal canal or neural foraminal stenosis. 3. Eccentric left disc bulge with a superimposed left paracentral disc protrusion at L5-S1 that narrows the left lateral recess and could affect the descending left S1 nerve root.  MRI of brain from 03/02/23  IMPRESSION: Normal MRI appearance of the Brain.   MRI of thoracic spine from 02/27/23  IMPRESSION: 1. No evidence of demyelinating disease in the thoracic spine. 2. No evidence of high-grade spinal canal or neural  foraminal stenosis. 3. Eccentric left disc bulge with a superimposed left paracentral disc protrusion at L5-S1 that narrows the left lateral recess and could affect the descending left S1 nerve root.  EMG of lower extremity 03/11/23 Conclusion: This is a normal study.  There is no electrodiagnostic evidence of large fiber peripheral neuropathy, or intrinsic muscle disease.  COGNITION: Overall cognitive status: Within functional limits for tasks assessed   SENSATION: Pt reports constant burning/tingling from the waist down   POSTURE: No Significant postural limitations  LOWER EXTREMITY ROM:  Active  Right Eval Left Eval  Hip flexion    Hip extension    Hip abduction    Hip adduction    Hip internal rotation    Hip external rotation    Knee flexion    Knee extension    Ankle dorsiflexion    Ankle plantarflexion    Ankle inversion    Ankle eversion     (Blank rows = not tested)  LOWER EXTREMITY MMT:  Tested in seated position   MMT Right Eval Left Eval  Hip flexion 4- 4-  Hip extension    Hip abduction 4 4  Hip adduction 4 4  Hip internal rotation    Hip external rotation    Knee flexion 4 4  Knee extension 4- 4-  Ankle dorsiflexion 4 4  Ankle plantarflexion    Ankle inversion    Ankle eversion    (Blank rows = not tested)  BED MOBILITY:  Pt reports she has been sleeping on the couch due to difficulty sleeping and difficulty getting into/out of the bed   TRANSFERS: Assistive device utilized: Environmental consultant - 2 wheeled  Sit to stand: SBA Stand to sit: SBA Chair to chair: SBA Very wide BOS and difficulty coordinating steps    GAIT: Gait pattern: step through pattern, decreased stride length, lateral hip instability, and wide BOS Distance walked: various clinic distances  Assistive device utilized: Environmental consultant - 2 wheeled Level of assistance: SBA Comments: Pt ambulates w/guarded-pattern and wide BOS   FUNCTIONAL TESTS:  Gait Speed: 2.2 ft/sec (2/17) BBS:  46/56 FGA: 11/30                                                                                                                             TREATMENT:    Self-care/home management  Entirety of session spent discussing options for treatment and providing therapeutic listening as pt discusses frustration with having to wait another month to be seen by PM&R. Pt reports she has researched transverse myelitis and is fearful she may have this, but had a negative ANA when she went to the ER. Pt reports her weight has fluctuated severely and she feels as though she is retaining fluid and could have lymphatic involvement.  Encouraged pt to reach out to PCP and inquire about updated imaging or a blood test, but ultimately she will get the quickest care if she goes to the ER. Pt continues to have saddle numbness, difficulty w/bowel and bladder, impaired sensation of BLEs and abdomen starting ~T8 and difficulty walking. Pt reports she will call PCP and PM&R to request sooner appointment and if things do not get better, she will go to ER.    PATIENT EDUCATION: Education details: Continue HEP, see above Person educated: Patient Education method: Medical illustrator Education comprehension: verbalized understanding and returned demonstration  HOME EXERCISE PROGRAM: Access Code: [email or web address] URL: [email or web address] Date: 03/24/2023 Prepared by: Peter Congo  Exercises - Supine Bridge  - 1 x  daily - 7 x weekly - 3 sets - 10 reps - 5 sec hold - Clamshell  - 1 x daily - 7 x weekly - 3 sets - 10 reps - Side Stepping with Resistance at Thighs and Counter Support  - 1 x daily - 7 x weekly - 3 sets - 10 reps - Forward Backward Monster Walk with Band at Thighs and Counter Support  - 1 x daily - 7 x weekly - 3 sets - 10 reps - Cat Cow to Child's Pose  - 1 x daily - 7 x weekly - 3 sets - 10 reps  Verbally added modified thomas stretch and half kneel hip flexor stretch  2/26  GOALS: Goals reviewed with patient? Yes  SHORT TERM GOALS: Target date: 04/15/2023    Pt will improve Berg score to 52/56 for decreased fall risk Baseline: 46/56 (2/17) Goal status: INITIAL  2.  Pt will improve gait velocity to at least 2.75 ft/sec for improved gait efficiency and performance at Supervision level  Baseline: 2.2 ft/sec no AD min A (2/17) Goal status: INITIAL  3.  Pt will improve FGA to 19/30 for decreased fall risk  Baseline: 11/30 (2/17) Goal status: INITIAL   LONG TERM GOALS: Target date: 04/29/2023   Pt will improve Berg score to 56/56 for decreased fall risk Baseline: 46/56 (2/17) Goal status: INITIAL  2.  Pt will improve gait velocity to at least 3.0 ft/sec for improved gait efficiency and performance at mod I level  Baseline: 2.2 ft/sec no AD min A (2/17) Goal status: INITIAL  3.  Pt will improve 5 x STS to less than or equal to 12 seconds without UE support to demonstrate improved functional strength and transfer efficiency.  Baseline:  Goal status: INITIAL  4.  Pt will ambulate >200' on level indoor surfaces w/no AD at mod I level for improved independence Baseline: using RW Goal status: INITIAL  5.  Pt will improve FGA to 27/30 for decreased fall risk  Baseline: 11/30 (2/17) Goal status: INITIAL   ASSESSMENT:  CLINICAL IMPRESSION: Emphasis of skilled PT session on provided therapeutic listening and advice for pt as she continues to manage her symptoms. Pt reports she continues to have "tourniquet" feeling around her abdomen and down both legs as well as saddle numbness and difficulty having BM. Pt would like to have transverse myelitis ruled out, so encouraged pt to reach out to PCP or go to ER to seek more quick care. Pt verbalized understanding and appreciation. Continue POC.    OBJECTIVE IMPAIRMENTS: Abnormal gait, decreased activity tolerance, decreased balance, decreased coordination, decreased knowledge of condition, decreased  mobility, difficulty walking, decreased strength, impaired sensation, improper body mechanics, and pain.   ACTIVITY LIMITATIONS: carrying, lifting, bending, sitting, standing, squatting, sleeping, stairs, transfers, bed mobility, bathing, hygiene/grooming, locomotion level, and caring for others  PARTICIPATION LIMITATIONS: cleaning, laundry, interpersonal relationship, driving, shopping, community activity, occupation, and yard work  PERSONAL FACTORS: Past/current experiences, Transportation, and 1 comorbidity: impaired sensation in BLEs  are also affecting patient's functional outcome.   REHAB POTENTIAL: Good  CLINICAL DECISION MAKING: Evolving/moderate complexity  EVALUATION COMPLEXITY: Moderate  PLAN:  PT FREQUENCY: 1-2x/week  PT DURATION: 6 weeks  PLANNED INTERVENTIONS: 97164- PT Re-evaluation, 97110-Therapeutic exercises, 97530- Therapeutic activity, 97112- Neuromuscular re-education, 97535- Self Care, 96045- Manual therapy, [phone]- Gait training, [phone]- Orthotic Fit/training, [phone]- Aquatic Therapy, [phone]- Electrical stimulation (manual), Patient/Family education, Balance training, Stair training, Dry Needling, Joint mobilization, Spinal mobilization, and DME instructions  PLAN FOR NEXT  SESSION: Did she go to ER? add to HEP to work on functional strength of BLEs and balance deficits (hip strengthening - SLS, lunges, resist sit to stands, step taps/step ups and balance based on FGA and BBS deficits - EC, tandem, stepping over obstacles, head turns). Lumbar extension. Core (planks? Superman, supine march); DN R hip flexors next visit with TT   Caly Pellum E Dane Bloch, PT, DPT  04/07/2023, 11:02 AM

## 2023-04-11 ENCOUNTER — Ambulatory Visit: Payer: PRIVATE HEALTH INSURANCE | Admitting: Physical Therapy

## 2023-04-11 VITALS — BP 150/86 | HR 78

## 2023-04-11 DIAGNOSIS — R2689 Other abnormalities of gait and mobility: Secondary | ICD-10-CM | POA: Diagnosis not present

## 2023-04-11 DIAGNOSIS — R2681 Unsteadiness on feet: Secondary | ICD-10-CM

## 2023-04-11 DIAGNOSIS — M6281 Muscle weakness (generalized): Secondary | ICD-10-CM

## 2023-04-11 DIAGNOSIS — M5459 Other low back pain: Secondary | ICD-10-CM

## 2023-04-11 NOTE — Therapy (Signed)
 OUTPATIENT PHYSICAL THERAPY NEURO TREATMENT   Patient Name: [name] MRN: [id number] DOB:[birth year], 38 y.o., female Today's Date: 04/11/2023   PCP: Octavia Heir, NP REFERRING PROVIDER: Amador Cunas, PA-C  END OF SESSION:  PT End of Session - 04/11/23 1018     Visit Number 7    Number of Visits 13   Plus eval   Date for PT Re-Evaluation 05/13/23    Authorization Type [employer]    PT Start Time 1015    PT Stop Time 1100    PT Time Calculation (min) 45 min    Equipment Utilized During Treatment Gait belt    Activity Tolerance Patient tolerated treatment well    Behavior During Therapy WFL for tasks assessed/performed                  Past Medical History:  Diagnosis Date   Anemia    Anxiety    Back pain    Constipation    Hashimoto's disease    History of chicken pox    HSV infection    Joint pain    Lactose intolerance    Lower back pain    Palpitations    PCOS (polycystic ovarian syndrome)    PCOS (polycystic ovarian syndrome)    Right hip pain    SOBOE (shortness of breath on exertion)    Thyroid condition    Velamentous insertion of umbilical cord    Vitamin D deficiency    Vitiligo    Past Surgical History:  Procedure Laterality Date   NO PAST SURGERIES     PERINEAL LACERATION REPAIR N/A 03/01/2020   Procedure: SUTURE REPAIR PERINEAL LACERATION;  Surgeon: Candice Camp, MD;  Location: [facility];  Service: Gynecology;  Laterality: N/A;   Patient Active Problem List   Diagnosis Date Noted   Gait abnormality 03/03/2023   Paresthesia 03/03/2023   Seasonal and perennial allergic rhinitis 12/30/2022   Seasonal allergic conjunctivitis 12/30/2022   Poor sleep 07/25/2022   Weight gain 07/25/2022   Pure hypercholesterolemia 03/20/2022   Generalized obesity with starting BMI 32 03/06/2022   Low back pain 03/06/2022   Vitamin D deficiency 03/06/2022   SOBOE (shortness of breath on exertion) 03/06/2022   Other fatigue 03/06/2022   Anxiety  07/31/2021   Gastroesophageal reflux disease without esophagitis 07/31/2021   Genital herpes simplex 07/31/2021   History of abnormal cervical Papanicolaou smear 07/31/2021   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   Subclinical hyperthyroidism 07/31/2021   Twin pregnancy 07/31/2021   Polycystic ovary syndrome 07/31/2021   Hashimoto's disease 05/21/2021   Thyroid antibody positive 01/24/2021   Vitiligo 01/24/2021   Pregnancy 02/29/2020   Vaginal tear resulting from childbirth 02/29/2020   Hyperandrogenemia 02/04/2012   PCOS (polycystic ovarian syndrome) 02/04/2012    ONSET DATE: 03/12/2023 (referral)   REFERRING DIAG: R29.898 (ICD-10-CM) - Bilateral leg weakness R20.2 (ICD-10-CM) - Paresthesia  THERAPY DIAG:  Other abnormalities of gait and mobility  Muscle weakness (generalized)  Other low back pain  Unsteadiness on feet  Rationale for Evaluation and Treatment: Rehabilitation  SUBJECTIVE:  SUBJECTIVE STATEMENT: Pt reports that she ran out of Cymbalta and realized it was masking her symptoms. Pt continues to feel very inflamed, has noticed red and splotchy areas on her skin (specially her legs). Pt also reports a tight band of pain around her thoracolumbar region. Pt also has localized pain there than has gotten worse and is described as pinching and burning.  Pt was referred to rheumatology but was never scheduled as the provider believed they would not be able to treat her symptoms.  Pt continues to also see pelvic floor PT, they DN her R glutes and hip flexors. Pt reports she felt good for a few hours after the DN then everything tightened up again. Pt is walking with a wider BOS due to not feeling steady. She had returned to driving but now has difficulty managing her R foot on the gas/brakes so has  since stopped driving. Pt also reports she is having stronger tingling sensations in her BLE. Pt reports having difficulty regulating her temperature.  Pt sees Dr. Shearon Stalls on 4/7 but keeps calling to see if she can be seen sooner.  Pt still plans on going to the ED to see if she can get further imaging and steroids to manage the inflammation.   Per PT eval: Pt presents w/RW and reports she has been using it for a few weeks. 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 Pt did not think much of it at the time, but by the next day, it had spread to her LLE and up to her umbilicus. Pt reports she has a constant burning sensation distal to her umbilicus and did have foot drop but her strength is slowly returning. States she feels like she has an egg in her anus and it is extremely uncomfortable to sit. Reports she feels like she has a tourniquet on her legs and tape wrapped around her ankles. BP fluctuates at times and she has random clamminess and sweatiness. Unsure if this is due to blood glucose. Has had multiple imaging and tests performed, including MRI, lumbar puncture and EMG. All came back normal.    Pt accompanied by: self  PERTINENT HISTORY: None relevant on file   PAIN:  Are you having pain? No Pt reports feeling "contracted" every where and a constant burning from her waist down   PRECAUTIONS: Fall  RED FLAGS: None   WEIGHT BEARING RESTRICTIONS: No  FALLS: Has patient fallen in last 6 months? No  LIVING ENVIRONMENT: Lives with: lives with their partner and lives with their daughter Lives in: House/apartment Stairs: Yes: External: 2 steps; none Has following equipment at home: Environmental consultant - 2 wheeled and Tour manager  PLOF: Independent  PATIENT GOALS: "To improve strength and balance the best that I can"   OBJECTIVE:  Note: Objective measures were completed at Evaluation unless otherwise noted.  DIAGNOSTIC FINDINGS: MRI of  lumbar spine from 02/27/23  IMPRESSION: 1. No evidence of demyelinating disease in the thoracic spine. 2. No evidence of high-grade spinal canal or neural foraminal stenosis. 3. Eccentric left disc bulge with a superimposed left paracentral disc protrusion at L5-S1 that narrows the left lateral recess and could affect the descending left S1 nerve root.  MRI of brain from 03/02/23  IMPRESSION: Normal MRI appearance of the Brain.   MRI of thoracic spine from 02/27/23  IMPRESSION: 1. No evidence of demyelinating disease in the thoracic spine. 2. No evidence  of high-grade spinal canal or neural foraminal stenosis. 3. Eccentric left disc bulge with a superimposed left paracentral disc protrusion at L5-S1 that narrows the left lateral recess and could affect the descending left S1 nerve root.  EMG of lower extremity 03/11/23 Conclusion: This is a normal study.  There is no electrodiagnostic evidence of large fiber peripheral neuropathy, or intrinsic muscle disease.  COGNITION: Overall cognitive status: Within functional limits for tasks assessed   SENSATION: Pt reports constant burning/tingling from the waist down   POSTURE: No Significant postural limitations  LOWER EXTREMITY ROM:     Active  Right Eval Left Eval  Hip flexion    Hip extension    Hip abduction    Hip adduction    Hip internal rotation    Hip external rotation    Knee flexion    Knee extension    Ankle dorsiflexion    Ankle plantarflexion    Ankle inversion    Ankle eversion     (Blank rows = not tested)  LOWER EXTREMITY MMT:  Tested in seated position   MMT Right Eval Left Eval  Hip flexion 4- 4-  Hip extension    Hip abduction 4 4  Hip adduction 4 4  Hip internal rotation    Hip external rotation    Knee flexion 4 4  Knee extension 4- 4-  Ankle dorsiflexion 4 4  Ankle plantarflexion    Ankle inversion    Ankle eversion    (Blank rows = not tested)  BED MOBILITY:  Pt reports she has  been sleeping on the couch due to difficulty sleeping and difficulty getting into/out of the bed   TRANSFERS: Assistive device utilized: Environmental consultant - 2 wheeled  Sit to stand: SBA Stand to sit: SBA Chair to chair: SBA Very wide BOS and difficulty coordinating steps    GAIT: Gait pattern: step through pattern, decreased stride length, lateral hip instability, and wide BOS Distance walked: various clinic distances  Assistive device utilized: Environmental consultant - 2 wheeled Level of assistance: SBA Comments: Pt ambulates w/guarded-pattern and wide BOS   FUNCTIONAL TESTS:  Gait Speed: 2.2 ft/sec (2/17) BBS: 46/56 FGA: 11/30                                                                                                                             TREATMENT:    Self-Care/Home Management   Vitals:   04/11/23 1042  BP: (!) 150/86  Pulse: 78   Assessed seated in LUE following some standing activity.  Encouraged patient to seek out a 2nd opinion in regards to her diagnosis and possible causes of her symptoms as she does not see PM&R for another month. Pt does still plan to go to the ED, is waiting on childcare and having an advocate to come with her. Provided emotional support to patient throughout session.  NMR In // bars to work on functional LE strengthening: Alt L/R lunges x 5 reps B  Lateral lunges x 5 reps B Decreased R ankle stability  In // bars to work on ankle strategy: Static stance on rocker board A/P direction x 30 sec, 2 x 60 sec no UE support Increased activation of LLE as compared to RLE M/L direction 3 x 60 sec no UE support Increased lateral sway as compared to A/P direction   PATIENT EDUCATION: Education details: Continue HEP, see above Person educated: Patient Education method: Medical illustrator Education comprehension: verbalized understanding and returned demonstration  HOME EXERCISE PROGRAM: Access Code: [email or web address] URL:  [email or web address] Date: 03/24/2023 Prepared by: Peter Congo  Exercises - Supine Bridge  - 1 x daily - 7 x weekly - 3 sets - 10 reps - 5 sec hold - Clamshell  - 1 x daily - 7 x weekly - 3 sets - 10 reps - Side Stepping with Resistance at Thighs and Counter Support  - 1 x daily - 7 x weekly - 3 sets - 10 reps - Forward Backward Monster Walk with Band at Thighs and Counter Support  - 1 x daily - 7 x weekly - 3 sets - 10 reps - Cat Cow to Child's Pose  - 1 x daily - 7 x weekly - 3 sets - 10 reps  Verbally added modified thomas stretch and half kneel hip flexor stretch 2/26  GOALS: Goals reviewed with patient? Yes  SHORT TERM GOALS: Target date: 04/15/2023    Pt will improve Berg score to 52/56 for decreased fall risk Baseline: 46/56 (2/17) Goal status: INITIAL  2.  Pt will improve gait velocity to at least 2.75 ft/sec for improved gait efficiency and performance at Supervision level  Baseline: 2.2 ft/sec no AD min A (2/17) Goal status: INITIAL  3.  Pt will improve FGA to 19/30 for decreased fall risk  Baseline: 11/30 (2/17) Goal status: INITIAL   LONG TERM GOALS: Target date: 04/29/2023   Pt will improve Berg score to 56/56 for decreased fall risk Baseline: 46/56 (2/17) Goal status: INITIAL  2.  Pt will improve gait velocity to at least 3.0 ft/sec for improved gait efficiency and performance at mod I level  Baseline: 2.2 ft/sec no AD min A (2/17) Goal status: INITIAL  3.  Pt will improve 5 x STS to less than or equal to 12 seconds without UE support to demonstrate improved functional strength and transfer efficiency.  Baseline:  Goal status: INITIAL  4.  Pt will ambulate >200' on level indoor surfaces w/no AD at mod I level for improved independence Baseline: using RW Goal status: INITIAL  5.  Pt will improve FGA to 27/30 for decreased fall risk  Baseline: 11/30 (2/17) Goal status: INITIAL   ASSESSMENT:  CLINICAL IMPRESSION: Emphasis of  skilled PT session on providing therapeutic listening and emotional support as patient remains frustrated by her worsening of symptoms and lack of an answer/treatment for her symptoms. Pt still plans on going to the ED for further imaging and steroid treatment, agreeable to work on strengthening in PT this session. Pt remains challenged due to ongoing LE weakness and impaired balance strategies. Pt continues to benefit from skilled PT services to work on functional strengthening to decrease her fall risk and work towards return to safe and independent mobility. Continue POC.    OBJECTIVE IMPAIRMENTS: Abnormal gait, decreased activity tolerance, decreased balance, decreased coordination, decreased knowledge of condition, decreased mobility, difficulty walking, decreased strength, impaired sensation, improper body mechanics, and pain.   ACTIVITY LIMITATIONS: carrying, lifting, bending, sitting, standing, squatting,  sleeping, stairs, transfers, bed mobility, bathing, hygiene/grooming, locomotion level, and caring for others  PARTICIPATION LIMITATIONS: cleaning, laundry, interpersonal relationship, driving, shopping, community activity, occupation, and yard work  PERSONAL FACTORS: Past/current experiences, Transportation, and 1 comorbidity: impaired sensation in BLEs  are also affecting patient's functional outcome.   REHAB POTENTIAL: Good  CLINICAL DECISION MAKING: Evolving/moderate complexity  EVALUATION COMPLEXITY: Moderate  PLAN:  PT FREQUENCY: 1-2x/week  PT DURATION: 6 weeks  PLANNED INTERVENTIONS: 97164- PT Re-evaluation, 97110-Therapeutic exercises, 97530- Therapeutic activity, 97112- Neuromuscular re-education, 97535- Self Care, 16109- Manual therapy, [phone]- Gait training, [phone]- Orthotic Fit/training, [phone]- Aquatic Therapy, [phone]- Electrical stimulation (manual), Patient/Family education, Balance training, Stair training, Dry Needling, Joint mobilization, Spinal mobilization, and DME  instructions  PLAN FOR NEXT SESSION: Did she go to ER? Assess STG, add to HEP to work on functional strength of BLEs and balance deficits (hip strengthening - SLS, lunges, resist sit to stands, step taps/step ups and balance based on FGA and BBS deficits - EC, tandem, stepping over obstacles, head turns). Lumbar extension. Core (planks? Superman, supine march)   Peter Congo, PT Peter Congo, PT, DPT, CSRS   04/11/2023, 11:01 AM

## 2023-04-14 ENCOUNTER — Ambulatory Visit: Payer: PRIVATE HEALTH INSURANCE | Admitting: Physical Therapy

## 2023-04-14 ENCOUNTER — Encounter: Payer: Self-pay | Admitting: Cardiovascular Disease

## 2023-04-14 ENCOUNTER — Encounter
Payer: PRIVATE HEALTH INSURANCE | Attending: Physical Medicine and Rehabilitation | Admitting: Physical Medicine and Rehabilitation

## 2023-04-14 VITALS — BP 121/84 | HR 95 | Ht 63.0 in | Wt 188.0 lb

## 2023-04-14 VITALS — BP 111/86 | HR 85

## 2023-04-14 DIAGNOSIS — R601 Generalized edema: Secondary | ICD-10-CM | POA: Insufficient documentation

## 2023-04-14 DIAGNOSIS — R2681 Unsteadiness on feet: Secondary | ICD-10-CM

## 2023-04-14 DIAGNOSIS — R2689 Other abnormalities of gait and mobility: Secondary | ICD-10-CM | POA: Diagnosis not present

## 2023-04-14 DIAGNOSIS — R200 Anesthesia of skin: Secondary | ICD-10-CM | POA: Insufficient documentation

## 2023-04-14 DIAGNOSIS — M5459 Other low back pain: Secondary | ICD-10-CM

## 2023-04-14 DIAGNOSIS — E063 Autoimmune thyroiditis: Secondary | ICD-10-CM | POA: Diagnosis present

## 2023-04-14 DIAGNOSIS — D219 Benign neoplasm of connective and other soft tissue, unspecified: Secondary | ICD-10-CM | POA: Diagnosis not present

## 2023-04-14 DIAGNOSIS — R269 Unspecified abnormalities of gait and mobility: Secondary | ICD-10-CM | POA: Insufficient documentation

## 2023-04-14 DIAGNOSIS — R32 Unspecified urinary incontinence: Secondary | ICD-10-CM | POA: Diagnosis not present

## 2023-04-14 DIAGNOSIS — M6281 Muscle weakness (generalized): Secondary | ICD-10-CM

## 2023-04-14 MED ORDER — GABAPENTIN 300 MG PO CAPS: ORAL_CAPSULE | ORAL | 3 refills | Status: DC

## 2023-04-14 NOTE — Therapy (Signed)
 OUTPATIENT PHYSICAL THERAPY NEURO TREATMENT   Patient Name: [name] MRN: [id number] DOB:October 08, 1985, 38 y.o., female Today's Date: 04/14/2023   PCP: Octavia Heir, NP REFERRING PROVIDER: Amador Cunas, PA-C  END OF SESSION:  PT End of Session - 04/14/23 1020     Visit Number 8    Number of Visits 13   Plus eval   Date for PT Re-Evaluation 05/13/23    Authorization Type [employer]    PT Start Time 1018    PT Stop Time 1101    PT Time Calculation (min) 43 min    Equipment Utilized During Treatment Gait belt    Activity Tolerance Patient tolerated treatment well    Behavior During Therapy WFL for tasks assessed/performed                   Past Medical History:  Diagnosis Date   Anemia    Anxiety    Back pain    Constipation    Hashimoto's disease    History of chicken pox    HSV infection    Joint pain    Lactose intolerance    Lower back pain    Palpitations    PCOS (polycystic ovarian syndrome)    PCOS (polycystic ovarian syndrome)    Right hip pain    SOBOE (shortness of breath on exertion)    Thyroid condition    Velamentous insertion of umbilical cord    Vitamin D deficiency    Vitiligo    Past Surgical History:  Procedure Laterality Date   NO PAST SURGERIES     PERINEAL LACERATION REPAIR N/A 03/01/2020   Procedure: SUTURE REPAIR PERINEAL LACERATION;  Surgeon: Candice Camp, MD;  Location: [facility];  Service: Gynecology;  Laterality: N/A;   Patient Active Problem List   Diagnosis Date Noted   Gait abnormality 03/03/2023   Paresthesia 03/03/2023   Seasonal and perennial allergic rhinitis 12/30/2022   Seasonal allergic conjunctivitis 12/30/2022   Poor sleep 07/25/2022   Weight gain 07/25/2022   Pure hypercholesterolemia 03/20/2022   Generalized obesity with starting BMI 32 03/06/2022   Low back pain 03/06/2022   Vitamin D deficiency 03/06/2022   SOBOE (shortness of breath on exertion) 03/06/2022   Other fatigue 03/06/2022   Anxiety  07/31/2021   Gastroesophageal reflux disease without esophagitis 07/31/2021   Genital herpes simplex 07/31/2021   History of abnormal cervical Papanicolaou smear 07/31/2021   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   Subclinical hyperthyroidism 07/31/2021   Twin pregnancy 07/31/2021   Polycystic ovary syndrome 07/31/2021   Hashimoto's disease 05/21/2021   Thyroid antibody positive 01/24/2021   Vitiligo 01/24/2021   Pregnancy 02/29/2020   Vaginal tear resulting from childbirth 02/29/2020   Hyperandrogenemia 02/04/2012   PCOS (polycystic ovarian syndrome) 02/04/2012    ONSET DATE: 03/12/2023 (referral)   REFERRING DIAG: R29.898 (ICD-10-CM) - Bilateral leg weakness R20.2 (ICD-10-CM) - Paresthesia  THERAPY DIAG:  Other abnormalities of gait and mobility  Unsteadiness on feet  Muscle weakness (generalized)  Other low back pain  Rationale for Evaluation and Treatment: Rehabilitation  SUBJECTIVE:  SUBJECTIVE STATEMENT: Pt presents w/SPC, wide and unsteady gait pattern. Reports she got in to see Dr. Shearon Stalls today, is nervous but is hopeful.   States she still has a lot of fluid retention. Saw PTA Saturday and was told her armpits are swollen and "humpy". Feels as though she does not void completely.    Per PT eval: Pt presents w/RW and reports she has been using it for a few weeks. 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 Pt did not think much of it at the time, but by the next day, it had spread to her LLE and up to her umbilicus. Pt reports she has a constant burning sensation distal to her umbilicus and did have foot drop but her strength is slowly returning. States she feels like she has an egg in her anus and it is extremely uncomfortable to sit.  Reports she feels like she has a tourniquet on her legs and tape wrapped around her ankles. BP fluctuates at times and she has random clamminess and sweatiness. Unsure if this is due to blood glucose. Has had multiple imaging and tests performed, including MRI, lumbar puncture and EMG. All came back normal.    Pt accompanied by: self  PERTINENT HISTORY: None relevant on file   PAIN:  Are you having pain? No Pt reports feeling "contracted" every where and a constant burning from her waist down   PRECAUTIONS: Fall  RED FLAGS: None   WEIGHT BEARING RESTRICTIONS: No  FALLS: Has patient fallen in last 6 months? No  LIVING ENVIRONMENT: Lives with: lives with their partner and lives with their daughter Lives in: House/apartment Stairs: Yes: External: 2 steps; none Has following equipment at home: Environmental consultant - 2 wheeled and Tour manager  PLOF: Independent  PATIENT GOALS: "To improve strength and balance the best that I can"   OBJECTIVE:  Note: Objective measures were completed at Evaluation unless otherwise noted.  DIAGNOSTIC FINDINGS: MRI of lumbar spine from 02/27/23  IMPRESSION: 1. No evidence of demyelinating disease in the thoracic spine. 2. No evidence of high-grade spinal canal or neural foraminal stenosis. 3. Eccentric left disc bulge with a superimposed left paracentral disc protrusion at L5-S1 that narrows the left lateral recess and could affect the descending left S1 nerve root.  MRI of brain from 03/02/23  IMPRESSION: Normal MRI appearance of the Brain.   MRI of thoracic spine from 02/27/23  IMPRESSION: 1. No evidence of demyelinating disease in the thoracic spine. 2. No evidence of high-grade spinal canal or neural foraminal stenosis. 3. Eccentric left disc bulge with a superimposed left paracentral disc protrusion at L5-S1 that narrows the left lateral recess and could affect the descending left S1 nerve root.  EMG of lower extremity 03/11/23 Conclusion: This  is a normal study.  There is no electrodiagnostic evidence of large fiber peripheral neuropathy, or intrinsic muscle disease.  COGNITION: Overall cognitive status: Within functional limits for tasks assessed   SENSATION: Pt reports constant burning/tingling from the waist down   POSTURE: No Significant postural limitations  LOWER EXTREMITY ROM:     Active  Right Eval Left Eval  Hip flexion    Hip extension    Hip abduction    Hip adduction    Hip internal rotation    Hip external rotation    Knee flexion    Knee extension    Ankle dorsiflexion    Ankle plantarflexion  Ankle inversion    Ankle eversion     (Blank rows = not tested)  LOWER EXTREMITY MMT:  Tested in seated position   MMT Right Eval Left Eval  Hip flexion 4- 4-  Hip extension    Hip abduction 4 4  Hip adduction 4 4  Hip internal rotation    Hip external rotation    Knee flexion 4 4  Knee extension 4- 4-  Ankle dorsiflexion 4 4  Ankle plantarflexion    Ankle inversion    Ankle eversion    (Blank rows = not tested)  BED MOBILITY:  Pt reports she has been sleeping on the couch due to difficulty sleeping and difficulty getting into/out of the bed   TRANSFERS: Assistive device utilized: Environmental consultant - 2 wheeled  Sit to stand: SBA Stand to sit: SBA Chair to chair: SBA Very wide BOS and difficulty coordinating steps    GAIT: Gait pattern: step through pattern, decreased stance time- Right, decreased stride length, lateral hip instability, lateral lean- Left, decreased trunk rotation, and wide BOS Distance walked: various clinic distances  Assistive device utilized: Single point cane Level of assistance: SBA Comments: Pt ambulates w/guarded-pattern and wide BOS, decreased eccentric control of LLE   VITALS  Vitals:   04/14/23 1040  BP: 111/86  Pulse: 85                                                                                                                                TREATMENT:     Self-Care/Home Management   Assessed vitals (see above) and WNL  LLE circumduction measured at knee and mid-thigh are ~1" larger than RLE  Time spent preparing for appointment w/Dr. Shearon Stalls later in day and addressing pt's questions about how much history to provide.   Physical Performance   [facility] PT Assessment - 04/14/23 1044       Ambulation/Gait   Gait velocity 32.8' over 11.78s = 2.78 ft/s   no AD, SBA     Functional Gait  Assessment   Gait assessed  Yes    Gait Level Surface Walks 20 ft, slow speed, abnormal gait pattern, evidence for imbalance or deviates 10-15 in outside of the 12 in walkway width. Requires more than 7 sec to ambulate 20 ft.   8.06s   Change in Gait Speed Able to smoothly change walking speed without loss of balance or gait deviation. Deviate no more than 6 in outside of the 12 in walkway width.    Gait with Horizontal Head Turns Performs head turns smoothly with slight change in gait velocity (eg, minor disruption to smooth gait path), deviates 6-10 in outside 12 in walkway width, or uses an assistive device.    Gait with Vertical Head Turns Performs head turns with no change in gait. Deviates no more than 6 in outside 12 in walkway width.    Gait and Pivot Turn Pivot turns safely within 3 sec and  stops quickly with no loss of balance.    Step Over Obstacle Is able to step over one shoe box (4.5 in total height) but must slow down and adjust steps to clear box safely. May require verbal cueing.    Gait with Narrow Base of Support Ambulates less than 4 steps heel to toe or cannot perform without assistance.    Gait with Eyes Closed Walks 20 ft, slow speed, abnormal gait pattern, evidence for imbalance, deviates 10-15 in outside 12 in walkway width. Requires more than 9 sec to ambulate 20 ft.   12.16s, lateral deviation to R   Ambulating Backwards Walks 20 ft, slow speed, abnormal gait pattern, evidence for imbalance, deviates 10-15 in outside 12 in walkway width.     Steps Two feet to a stair, must use rail.   Step-to pattern, no rail   Total Score 16    FGA comment: High fall risk              Discussed goal results and lack of functional improvement noted on FGA. Improvement seen due to pt acclimation to impaired sensation, but pt continues to ambulate w/slow, guarded gait pattern w/reduced stance time on RLE.     PATIENT EDUCATION: Education details: Goal results, see self-care section  Person educated: Patient Education method: Explanation and Demonstration Education comprehension: verbalized understanding and returned demonstration  HOME EXERCISE PROGRAM: Access Code: [email or web address] URL: [email or web address] Date: 03/24/2023 Prepared by: Peter Congo  Exercises - Supine Bridge  - 1 x daily - 7 x weekly - 3 sets - 10 reps - 5 sec hold - Clamshell  - 1 x daily - 7 x weekly - 3 sets - 10 reps - Side Stepping with Resistance at Thighs and Counter Support  - 1 x daily - 7 x weekly - 3 sets - 10 reps - Forward Backward Monster Walk with Band at Thighs and Counter Support  - 1 x daily - 7 x weekly - 3 sets - 10 reps - Cat Cow to Child's Pose  - 1 x daily - 7 x weekly - 3 sets - 10 reps  Verbally added modified thomas stretch and half kneel hip flexor stretch 2/26  GOALS: Goals reviewed with patient? Yes  SHORT TERM GOALS: Target date: 04/15/2023    Pt will improve Berg score to 52/56 for decreased fall risk Baseline: 46/56 (2/17) Goal status: INITIAL  2.  Pt will improve gait velocity to at least 2.75 ft/sec for improved gait efficiency and performance at Supervision level  Baseline: 2.2 ft/sec no AD min A (2/17); 2.78 ft/s w/SBA no AD  Goal status: MET   3.  Pt will improve FGA to 19/30 for decreased fall risk  Baseline: 11/30 (2/17); 16/30 93/10)  Goal status: NOT MET    LONG TERM GOALS: Target date: 04/29/2023   Pt will improve Berg score to 56/56 for decreased fall risk Baseline: 46/56 (2/17) Goal status:  INITIAL  2.  Pt will improve gait velocity to at least 3.0 ft/sec for improved gait efficiency and performance at mod I level  Baseline: 2.2 ft/sec no AD min A (2/17) Goal status: INITIAL  3.  Pt will improve 5 x STS to less than or equal to 12 seconds without UE support to demonstrate improved functional strength and transfer efficiency.  Baseline:  Goal status: INITIAL  4.  Pt will ambulate >200' on level indoor surfaces w/no AD at mod I level for improved independence Baseline: using RW Goal status: INITIAL  5.  Pt will improve FGA to 23/30 for decreased fall risk  Baseline: 11/30 (2/17); 16/30 (3/10)  Goal status: REVISED   ASSESSMENT:  CLINICAL IMPRESSION: Emphasis of skilled PT session on LTG assessment and addressing pt's concerns regarding MD appointment later today. Pt has met 1 of 3 LTGs, improving her gait speed w/o AD. Pt did improve her score on FGA, but not quite to goal level and more indicative of acclimation rather than functional improvement. Will assess Berg next session. Pt reports feelings of fluid retention, so measured legs and noted LLE about 1" larger in diameter than RLE.  Pt continues to report "binding" sensation around umbilicus as well as around BLEs. Pt reports she would like to continue strengthening in PT as she thinks this is helpful but agrees that her progress is likely more attributed to acclimation rather than true functional improvement. Continue POC.    OBJECTIVE IMPAIRMENTS: Abnormal gait, decreased activity tolerance, decreased balance, decreased coordination, decreased knowledge of condition, decreased mobility, difficulty walking, decreased strength, impaired sensation, improper body mechanics, and pain.   ACTIVITY LIMITATIONS: carrying, lifting, bending, sitting, standing, squatting, sleeping, stairs, transfers, bed mobility, bathing, hygiene/grooming, locomotion level, and caring for others  PARTICIPATION LIMITATIONS: cleaning, laundry,  interpersonal relationship, driving, shopping, community activity, occupation, and yard work  PERSONAL FACTORS: Past/current experiences, Transportation, and 1 comorbidity: impaired sensation in BLEs  are also affecting patient's functional outcome.   REHAB POTENTIAL: Good  CLINICAL DECISION MAKING: Evolving/moderate complexity  EVALUATION COMPLEXITY: Moderate  PLAN:  PT FREQUENCY: 1-2x/week  PT DURATION: 6 weeks  PLANNED INTERVENTIONS: 97164- PT Re-evaluation, 97110-Therapeutic exercises, 97530- Therapeutic activity, 97112- Neuromuscular re-education, [phone]- Self Care, 60454- Manual therapy, [phone]- Gait training, [phone]- Orthotic Fit/training, [phone]- Aquatic Therapy, [phone]- Electrical stimulation (manual), Patient/Family education, Balance training, Stair training, Dry Needling, Joint mobilization, Spinal mobilization, and DME instructions  PLAN FOR NEXT SESSION: Sharlene Motts, How was appointment w/Dr. Shearon Stalls? add to HEP to work on functional strength of BLEs and balance deficits (hip strengthening - SLS, lunges, resist sit to stands, step taps/step ups and balance based on FGA and BBS deficits - EC, tandem, stepping over obstacles, head turns). Lumbar extension. Core (planks? Superman, supine march)   Cashay Manganelli E Aldridge Krzyzanowski, PT, DPT 04/14/2023, 12:00 PM

## 2023-04-14 NOTE — Patient Instructions (Signed)
 I will be reviewing your records to see if we need to repeat your MRI vs. Other referral for your ascending sensory loss in both legs ~ T8. I will message you through mychart in the next few days regarding these recommendations.  I am switching you from Lyrica to gabapentin for sharp burning pain; start with 300 mg at nighttime and after 1 week can add 300 mg in the morning, and then after another week increase to 600 mg at night if no side effects.  Continue duloxetine  Continue PT  Follow up with me in 1 month.

## 2023-04-14 NOTE — Progress Notes (Unsigned)
  Cardiology Office Note:  .   Date:  04/14/2023  ID:  Talene Glastetter, DOB 1986-01-19, MRN [id number] PCP: Octavia Heir, NP  [facility] Providers Cardiologist:  New to [facility]  Click to update primary MD,subspecialty MD or APP then REFRESH:1}   History of Present Illness: .   Pearle Wandler is a 38 y.o. female with hx of CP, hashimotos   We were asked to see her for further evaluation and management   She was swimming in May, had been swimming since March  Developed CP , was pushing a bit harder that day  Went to the ER  Work up was negative in the ER   She tried swimming several weeks later but developed more cp  Pain is not related to twisting or turning her torso  Now , she is not able to exercise much without having cp  Now , has CP walking up hill   LDL was 171 in May, 2024  Eats a fairly healthy diet  Avoid processed foods  Does not drink Non smoker    Had a normal pregnancy several years ago ( had a twin loss)   Fam hx : Mother had HTN, AS Father :  HTN Aunt : died at 51 of a valvular issue ( congenital heart issues )    Works in occupational therapy   April 15, 2023 Anicia is seen for follow up of her chest pain  Coronary CTA showed a CAC score of 0 Minimal coronary plaque , no obstructive lesions  Echo 09/03/22:  normal LV systolic and diastolic function Trivial MR      ROS:   Studies Reviewed: .                       Risk Assessment/Calculations:             Physical Exam:     Physical Exam: unknown if currently breastfeeding.       GEN:  Well nourished, well developed in no acute distress HEENT: Normal NECK: No JVD; No carotid bruits LYMPHATICS: No lymphadenopathy CARDIAC: RRR ***, no murmurs, rubs, gallops RESPIRATORY:  Clear to auscultation without rales, wheezing or rhonchi  ABDOMEN: Soft, non-tender, non-distended MUSCULOSKELETAL:  No edema; No deformity  SKIN: Warm and dry NEUROLOGIC:  Alert and oriented x  3        ASSESSMENT AND PLAN: .     1.  Exertional chest pain:    2.  Hyperlipidemia:             Dispo:   Signed, Kristeen Miss, MD

## 2023-04-14 NOTE — Progress Notes (Signed)
 Subjective:    Patient ID: [name], female    DOB: 07/19/1985, 38 y.o.   MRN: [id number]  HPI HPI  [name] is a 38 y.o. year old female  who  has a past medical history of Anemia, Anxiety, Back pain, Constipation, Hashimoto's disease, History of chicken pox, HSV infection, Joint pain, Lactose intolerance, Lower back pain, Palpitations, PCOS (polycystic ovarian syndrome), PCOS (polycystic ovarian syndrome), Right hip pain, SOBOE (shortness of breath on exertion), Thyroid condition, Velamentous insertion of umbilical cord, Vitamin D deficiency, and Vitiligo.   They are presenting to PM&R clinic as a new patient for pain management evaluation. They were referred by Hazle Nordmann, NP for treatment of bilateral LE neuropathic pain.   Per her last note:  Neuropathic symptoms began on February 26, 2023, with right leg numbness while walking at work. Symptoms worsened the next day, spreading to the left side and lower abdomen, with numbness, tingling, a gripping or spasming feeling, burning sensation, and internal swelling. She is having trouble ambulating on her own and using a walker this time. No loss of bowel or bladder function. She is concerned of muscle wasting and foot drop. She has visited the emergency room three times in January 2025, where MRIs of the brain, cervical/thoracic/lumbar spine no demyelinating disease or abnormal cervical spinal cord processes. Cbc/diff, CMP, TSH, HIV, Lyme disease, RPR, ANA, CPR and sed rate normal. Blood cultures no growth. CSF unremarkable. She was referred to neurology and orthopedics and started on Cymbalta for ongoing paresthesia's/ neuropathy. A prednisone taper was tried without improvement.   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 She was sitting and typing, stood up and her R leg was "all pins and needles"; she didn't think much of it but it did not get better. The next day, she completely lost  movement and got foot drop in her right leg. It spread to the right side of her abdomen and tingling in her left knee and foot. She borrowed a walker from work and went to the ED. Over 3-4 weeks, it transitioned to complete symmetrical burning/tingling/tightness to her mid-abdomen and back.  She also developed bowel issues where "I couldn't even feel it coming down"; this has gotten better, but she took a lot of laxatives and things eventually seemed to get better. Urination has intermittent incontinence at this point, especially when changing positions.   She denies any recent travel, vaccinations, stressors; she has a 38 year old who gave her hand/foot/mouth in September.   "I have always suspected I had some autoimmune stuff brewing...ever since I had my son I have not felt right with fatigue, fluid retention, and lymph node swelling". Got Dx with Hashimoto's thyroiditis right after childbirth and has vitiligo.   Family Hx of mother's-sister having ALS; mom had FM and Lyme disease.   Description of pain: constant, burning, and aching Exacerbating factors: laying flat, sitting, standing, activity, and heat (hot pack made her right flank much worse and gave her nausea).  Remitting factors: nothing Red flag symptoms: saddle anesthesia, loss of bowel or bladder continence, new weakness, new numbness/tingling, and pain waking up at nighttime. Wakes up with burning.   Medications tried: Topical medications (never tried) :  Nsaids (no effect) : Ibuprofen Tylenol  (no effect) : Tylenol  Opiates  (no effect) : Took a few tramadols and it "did nothing".   Gabapentin / Lyrica  (no effect) : Lyrica 75 mg  twice daily - took for a week and didn't do anything. Never tried gabapentin.   TCAs  (never tried) :   SNRIs  (unsure of effect) : Duloxetine may have been helping, she is not sure. Has not bene on antidepressants other than cymbalta.   Other  (no effect) :  Prednisone taper for 6 days  - did  not do much. Muscle relaxer made her tired--she denies spasms but says he whole body wants to go into extension if she yawns and stretches.   Other treatments: PT/OT  (mild effect) : Is currently in PT; she says it is going great for leg strengthening but she only improved a few points on functional movement. Sees a pelvic floor PT OOP.   She has done some lymphatic release with PT as well, which helps relax her abdomen and with constipation.  Accupuncture/chiropractor/massage  (mild effect) : Massage and chiropractor in the past for low back pain.  Has done dry needling in her right low back and hip which helps but is temporary.  TENs unit (never tried) :   Injections (never tried) : She asked her ortho doctor for [facility] and was not offered it.   Surgery (mild effect) : 4th degree tear with childbirth.   History right iliopsoas and quadratus lumborum inflammation/pain following a dead lift injury 6 years ago, managed nonsurgically with strengthening and dry needling.  States this feels more "visceral".  Has history of fibroids, managed nonoperatively, treated with prescription to minimize bleeding  (patient has not tried prescription yet).   Other  () : rheumatology reviewed her referral and determined she was not appropriate for them; she had a endocrinologist at [facility] but her.   Notes history of HSV-2.  She does dry brushing before showers, has not noticed the benefit.  Goals for pain control: She currently cannot work and has a 38 year old at home that is difficult to take care of. "I just want to figure it out." She's stopped driving because she can't feel the gas or the brake in her car very well.   Prior UDS results: No results found for: "LABOPIA", "COCAINSCRNUR", "LABBENZ", "AMPHETMU", "THCU", "LABBARB"    Pain Inventory Average Pain 9 Pain Right Now 8 My pain is constant, sharp, burning, tingling, and aching  In the last 24 hours, has pain interfered with the  following? General activity 10 Relation with others 10 Enjoyment of life 10 What TIME of day is your pain at its worst? morning  and night Sleep (in general) Poor  Pain is worse with: sitting, inactivity, and some activites Pain improves with: pacing activities and constant repositioning Relief from Meds:  .  walk with assistance use a cane how many minutes can you walk? 10 ability to climb steps?  yes do you drive?  yes  employed # of hrs/week 40 what is your job? OT I need assistance with the following:  dressing, meal prep, household duties, and shopping  bladder control problems bowel control problems weakness numbness tingling trouble walking depression anxiety  New pt  New pt    Family History  Problem Relation Age of Onset   Allergic rhinitis Mother    Urticaria Mother    Asthma Mother    Thyroid disease Mother    Hypertension Mother    Cancer Mother        Cervical Cancer   Heart disease Mother    Depression Mother    Anxiety disorder Mother    Obesity Mother  Hypertension Father    Hepatitis C Father    Allergic rhinitis Brother    Asthma Brother    Allergic rhinitis Brother    Urticaria Brother    Allergic rhinitis Brother    Thyroid disease Maternal Aunt    Thyroid disease Maternal Grandmother    Stroke Neg Hx    Hyperlipidemia Neg Hx    Early death Neg Hx    Social History   Socioeconomic History   Marital status: Significant Other    Spouse name: Tenny Craw   Number of children: Not on file   Years of education: 16+   Highest education level: Not on file  Occupational History   Occupation: Occupational therapist   Occupation: Acupuncturist  Tobacco Use   Smoking status: Never    Passive exposure: Past   Smokeless tobacco: Never  Vaping Use   Vaping status: Never Used  Substance and Sexual Activity   Alcohol use: Not Currently    Alcohol/week: 1.0 standard drink of alcohol    Types: 1 Glasses of wine per week   Drug  use: No   Sexual activity: Not on file  Other Topics Concern   Not on file  Social History Narrative   Regular exercise-yes Caffeine Use-yes   Social Drivers of Health   Financial Resource Strain: Not on file  Food Insecurity: Not on file  Transportation Needs: Not on file  Physical Activity: Not on file  Stress: Not on file  Social Connections: Unknown (06/03/2021)   [facility]   Social Network    Social Network: Not on file   Past Surgical History:  Procedure Laterality Date   NO PAST SURGERIES     PERINEAL LACERATION REPAIR N/A 03/01/2020   Procedure: SUTURE REPAIR PERINEAL LACERATION;  Surgeon: Candice Camp, MD;  Location: [facility] LD ORS;  Service: Gynecology;  Laterality: N/A;   Past Medical History:  Diagnosis Date   Anemia    Anxiety    Back pain    Constipation    Hashimoto's disease    History of chicken pox    HSV infection    Joint pain    Lactose intolerance    Lower back pain    Palpitations    PCOS (polycystic ovarian syndrome)    PCOS (polycystic ovarian syndrome)    Right hip pain    SOBOE (shortness of breath on exertion)    Thyroid condition    Velamentous insertion of umbilical cord    Vitamin D deficiency    Vitiligo    BP 121/84   Pulse 95   Ht 5\' 3"  (1.6 m)   Wt 188 lb (85.3 kg)   SpO2 98%   BMI 33.30 kg/m   Opioid Risk Score:   Fall Risk Score:  `1  Depression screen [facility] 2/9     04/14/2023    2:32 PM 03/13/2023    2:31 PM 03/03/2023   10:42 AM 03/06/2022    7:39 AM 01/13/2012    1:27 PM  Depression screen PHQ 2/9  Decreased Interest 0 0 0 3 0  Down, Depressed, Hopeless 1 0 0 3 0  PHQ - 2 Score 1 0 0 6 0  Altered sleeping 3   2   Tired, decreased energy 3   2   Change in appetite 0   3   Feeling bad or failure about yourself  0   1   Trouble concentrating 0   2   Moving slowly  or fidgety/restless 3   0   Suicidal thoughts 0   0   PHQ-9 Score 10   16   Difficult doing work/chores Extremely dIfficult    Somewhat difficult       Review of Systems  Musculoskeletal:        Pain, tingling, pin and needles from waist down  All other systems reviewed and are negative.     Objective:   Physical Exam  PE: Constitution: Appropriate appearance for age. No apparent distress  +Obese Resp: No respiratory distress. No accessory muscle usage. on RA Cardio: Well perfused appearance. Trace bilateral peripheral edema. Abdomen: Nondistended. Nontender.   Psych: Appropriate mood and affect. Neuro: AAOx4. No apparent cognitive deficits   Neurologic Exam:   DTRs: Reflexes were decreased in the left patella and bilateral achilles; 3+ right patella, 1 beat clonus vs. MAS 1 spasticity with R ankle jerk. Babinsky: absent bilaterally Sensory exam: revealed normal sensation in all dermatomal regions until about T7-8 anterior and posteriorly; distally there is reduced sensation to light touch and absent light touch/pinprick discrimination throughout (perceives most light touch as sharp).  Motor exam: strength 5/5 throughout bilateral upper extremities and with exception of 5-/5 bilateral hip flexors; 5-/5 left knee extensor; 5/5DF, PF, and EHL Coordination: Fine motor coordination was slightly delayed in the RLE.  Gait: Wide based and slightly meandering; no foot catching or trendelenberg.        Assessment & Plan:   [name] is a 38 y.o. year old female  who  has a past medical history of Anemia, Anxiety, Back pain, Constipation, Hashimoto's disease, History of chicken pox, HSV infection, Joint pain, Lactose intolerance, Lower back pain, Palpitations, PCOS (polycystic ovarian syndrome), PCOS (polycystic ovarian syndrome), Right hip pain, SOBOE (shortness of breath on exertion), Thyroid condition, Velamentous insertion of umbilical cord, Vitamin D deficiency, and Vitiligo.   They are presenting to PM&R clinic as a new patient for treatment of bilateral lower extremity sensory loss starting February 26, 2023 on the right, then progressing to the left and superiorly to to T7-8. They were referred by Hazle Nordmann, NP .   Sensory loss (T7/8 and below) Gait abnormality  I will be reviewing your records to see if we need to repeat your MRI vs. Other referral for your ascending sensory loss in both legs ~ T8. I will message you through mychart in the next few days regarding these recommendations.  I am switching you from Lyrica to gabapentin for sharp burning pain; start with 300 mg at nighttime and after 1 week can add 300 mg in the morning, and then after another week increase to 600 mg at night if no side effects.  Per patient, Ortho refused ESI for left S1 radiculopathy.  Continue duloxetine  Follow up with me in 1 month.   Continue PT; currently using a walker for gait and agree with ongoing use of this due to instability.     Thus far, workup significant for the below:   -MRI lumbar and thoracic spine without contrast 02-27-2023, with findings of left paracentral disc perfusion/disc bulge at L5-S1 with possible left S1 nerve impingement.  Multilevel small degenerative disc bulges.   -CT renal stone study, MR cervical spine with and without contrast, and MRI brain with and without contrast 03/02/23 significant for small left incidental calcified uterine fibroid, lower cervical disc degeneration with small disc protrusions and facet hypertrophy atrophy at cervicothoracic junction; foraminal stenosis moderate left C6, mild left C7 and mild  left C8   -Neurology evaluation by Dr. Terrace Arabia 03-03-2023:   - EMG done 2-25, 3 limb NCS with bilateral lower extremity EMG including bilateral lumbar paraspinals, all normal   - LP: Done 1-29,.  WBC 1, total protein 27, considered normal.     -CT angiogram with and without contrast 03-05-2023 with findings of posterior wall enhancement suggesting fibroid; no vascular abnormalities.   -HIV, RPR, Lyme panel, drug screen, ESR, CRP, B12, ANA, heavy metal screen  normal; TPO 206 from 367 02/19/23   -- Note, elevated WBCs from 11.1-14.8 between 1-25 and 2-4; have normalized as of 2-11.  -----------------------------------------------------------------------  Patient had a fairly extensive workup for her bilateral lower extremity weakness and sensory loss.  These include noncontrast MRI thoracic and lumbar spine, contrast cervical and brain MRI, CT renal stone study, CT angiogram, inflammatory studies, screenings for infectious causes of peripheral neuropathy, heavy metal testing, and appropriately extensive EMG and LP by neurology.   Symptoms are improving with PT gradually, which is hopeful.  Noted workup has not included B1, B3, B6 or folate testing, contrast MRI of thoracic and lumbar spine ( note Artery of Adamkiewicz is in about the same dermatome as her symptoms).  Since physical exam is suggestive of neurologic changes with findings suggestive of upper motor neuron changes on the right leg, do feel that ongoing aggressive workup is warranted at this time.  Urinary incontinence, unspecified type -     Ambulatory referral to Urology Ongoing bowel bladder incontinence since sensory loss started; agree with previous recommendations for pelvic PT, feel she would benefit from urodynamic studies to evaluate for neurogenic bladder versus overflow incontinence.  Urology referral made.   Fibroids/PCOS Generalized edema Uterine fibroid seen on multiple MRIs and followed with GYN; has medication to stop bleeding but has not started taking this yet.  No lymphadenopathy seen on imaging studies as above.  Possible symptoms related to congestion in the pelvis; patient notes concern for endometriosis, and has had issues with disruptive uterine bleeding before and ER documentation notes symptoms started during her period.  While MRI can be useful in evaluation of endometriosis, gold standard remains laparoscopy.  Will get GYNs contact information from patient and  reach out to her regarding this concern..  Hashimoto's disease Has been referred to endocrinology but currently not following with anyone; TSO remains elevated but downtrending.  Managed by PCP.  Other orders -     Gabapentin; Start with 300 mg at nighttime; if well tolerated at 1 week, can increase to 300 mg in the morning and 300 mg at nighttime, then in another week 300 mg in the morning and 600 mg at nighttime  Dispense: 90 capsule; Refill: 3

## 2023-04-15 ENCOUNTER — Ambulatory Visit: Payer: PRIVATE HEALTH INSURANCE | Attending: Cardiovascular Disease | Admitting: Cardiovascular Disease

## 2023-04-15 ENCOUNTER — Encounter: Payer: Self-pay | Admitting: Cardiovascular Disease

## 2023-04-15 VITALS — BP 126/88 | HR 91 | Ht 63.0 in | Wt 189.2 lb

## 2023-04-15 DIAGNOSIS — R079 Chest pain, unspecified: Secondary | ICD-10-CM

## 2023-04-15 NOTE — Patient Instructions (Signed)
 Follow-Up: At [facility], you and your health needs are our priority.  As part of our continuing mission to provide you with exceptional heart care, we have created designated Provider Care Teams.  These Care Teams include your primary Cardiologist (physician) and Advanced Practice Providers (APPs -  Physician Assistants and Nurse Practitioners) who all work together to provide you with the care you need, when you need it.  Your next appointment:   As Needed  Provider:   Kristeen Miss, MD      1st Floor: - Lobby - Registration  - Pharmacy  - Lab - Cafe  2nd Floor: - PV Lab - Diagnostic Testing (echo, CT, nuclear med)  3rd Floor: - Vacant  4th Floor: - TCTS (cardiothoracic surgery) - AFib Clinic - Structural Heart Clinic - Vascular Surgery  - Vascular Ultrasound  5th Floor: - [facility] Cardiology (general and EP) - Clinical Pharmacy for coumadin, hypertension, lipid, weight-loss medications, and med management appointments    Valet parking services will be available as well.

## 2023-04-16 ENCOUNTER — Encounter: Payer: Self-pay | Admitting: Physical Medicine and Rehabilitation

## 2023-04-18 ENCOUNTER — Telehealth: Payer: Self-pay | Admitting: Physical Therapy

## 2023-04-18 ENCOUNTER — Ambulatory Visit: Payer: PRIVATE HEALTH INSURANCE | Admitting: Physical Therapy

## 2023-04-21 ENCOUNTER — Ambulatory Visit: Payer: PRIVATE HEALTH INSURANCE | Admitting: Physical Therapy

## 2023-04-21 DIAGNOSIS — D219 Benign neoplasm of connective and other soft tissue, unspecified: Secondary | ICD-10-CM | POA: Insufficient documentation

## 2023-04-25 ENCOUNTER — Ambulatory Visit: Payer: PRIVATE HEALTH INSURANCE | Admitting: Physical Therapy

## 2023-04-25 VITALS — BP 132/89 | HR 82

## 2023-04-25 DIAGNOSIS — R2689 Other abnormalities of gait and mobility: Secondary | ICD-10-CM

## 2023-04-25 DIAGNOSIS — M6281 Muscle weakness (generalized): Secondary | ICD-10-CM

## 2023-04-25 DIAGNOSIS — M5459 Other low back pain: Secondary | ICD-10-CM

## 2023-04-25 DIAGNOSIS — R2681 Unsteadiness on feet: Secondary | ICD-10-CM

## 2023-04-25 NOTE — Therapy (Signed)
 OUTPATIENT PHYSICAL THERAPY NEURO TREATMENT   Patient Name: [name] MRN: [id number] DOB:08/23/1985, 38 y.o., female Today's Date: 04/25/2023   PCP: Octavia Heir, NP REFERRING PROVIDER: Amador Cunas, PA-C  END OF SESSION:  PT End of Session - 04/25/23 1024     Visit Number 9    Number of Visits 13   Plus eval   Date for PT Re-Evaluation 05/13/23    Authorization Type [employer]    PT Start Time 1022   pt arrived late   PT Stop Time 1101    PT Time Calculation (min) 39 min    Equipment Utilized During Treatment Gait belt    Activity Tolerance Patient tolerated treatment well    Behavior During Therapy WFL for tasks assessed/performed                    Past Medical History:  Diagnosis Date   Anemia    Anxiety    Back pain    Constipation    Hashimoto's disease    History of chicken pox    HSV infection    Joint pain    Lactose intolerance    Lower back pain    Palpitations    PCOS (polycystic ovarian syndrome)    PCOS (polycystic ovarian syndrome)    Right hip pain    SOBOE (shortness of breath on exertion)    Thyroid condition    Velamentous insertion of umbilical cord    Vitamin D deficiency    Vitiligo    Past Surgical History:  Procedure Laterality Date   NO PAST SURGERIES     PERINEAL LACERATION REPAIR N/A 03/01/2020   Procedure: SUTURE REPAIR PERINEAL LACERATION;  Surgeon: Candice Camp, MD;  Location: [facility];  Service: Gynecology;  Laterality: N/A;   Patient Active Problem List   Diagnosis Date Noted   Fibroids 04/21/2023   Urinary incontinence 04/14/2023   Generalized edema 04/14/2023   Sensory loss 04/14/2023   Gait abnormality 03/03/2023   Paresthesia 03/03/2023   Seasonal and perennial allergic rhinitis 12/30/2022   Seasonal allergic conjunctivitis 12/30/2022   Poor sleep 07/25/2022   Weight gain 07/25/2022   Pure hypercholesterolemia 03/20/2022   Generalized obesity with starting BMI 32 03/06/2022   Low back pain  03/06/2022   Vitamin D deficiency 03/06/2022   SOBOE (shortness of breath on exertion) 03/06/2022   Other fatigue 03/06/2022   Anxiety 07/31/2021   Gastroesophageal reflux disease without esophagitis 07/31/2021   Genital herpes simplex 07/31/2021   History of abnormal cervical Papanicolaou smear 07/31/2021   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   Subclinical hyperthyroidism 07/31/2021   Twin pregnancy 07/31/2021   Polycystic ovary syndrome 07/31/2021   Hashimoto's disease 05/21/2021   Thyroid antibody positive 01/24/2021   Vitiligo 01/24/2021   Pregnancy 02/29/2020   Vaginal tear resulting from childbirth 02/29/2020   Hyperandrogenemia 02/04/2012   PCOS (polycystic ovarian syndrome) 02/04/2012    ONSET DATE: 03/12/2023 (referral)   REFERRING DIAG: R29.898 (ICD-10-CM) - Bilateral leg weakness R20.2 (ICD-10-CM) - Paresthesia  THERAPY DIAG:  Other abnormalities of gait and mobility  Muscle weakness (generalized)  Unsteadiness on feet  Other low back pain  Rationale for Evaluation and Treatment: Rehabilitation  SUBJECTIVE:  SUBJECTIVE STATEMENT: Pt reports that she and her household were sick last week, they are doing better now. Pt had her appointment with Dr. Shearon Stalls since last visit, going to do a repeat MRI of her thoracic and lumbar spine and going to have more bloodwork done. Pt has not yet heard from [facility] about scheduling her MRI, is going to call them today to try and schedule. Pt has also started gabapentin, she feels like this is helping her symptoms a little bit and especially at night.  Pt reports that from her knee down her leg still feels tight but overall she is feeling better and stronger until she gets tired. Pt has been doing more around the house and doing yardwork, on  strenuous days it can take a few days to recover. Pt also signed up for the [employer] again and is looking forward to getting into the pool. Pt has been working on her PT exercises and walking up/down the incline in her backyard as well as working on lateral walking.  Pt is feeling better mentally having more of a plan and physically she does feel stronger but has ongoing "spasticy" feeling especially in her R leg.   Per PT eval: Pt presents w/RW and reports she has been using it for a few weeks. 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 Pt did not think much of it at the time, but by the next day, it had spread to her LLE and up to her umbilicus. Pt reports she has a constant burning sensation distal to her umbilicus and did have foot drop but her strength is slowly returning. States she feels like she has an egg in her anus and it is extremely uncomfortable to sit. Reports she feels like she has a tourniquet on her legs and tape wrapped around her ankles. BP fluctuates at times and she has random clamminess and sweatiness. Unsure if this is due to blood glucose. Has had multiple imaging and tests performed, including MRI, lumbar puncture and EMG. All came back normal.    Pt accompanied by: self  PERTINENT HISTORY: None relevant on file   PAIN:  Are you having pain? No Pt reports feeling "contracted" every where and a constant burning from her waist down   PRECAUTIONS: Fall  RED FLAGS: None   WEIGHT BEARING RESTRICTIONS: No  FALLS: Has patient fallen in last 6 months? No  LIVING ENVIRONMENT: Lives with: lives with their partner and lives with their daughter Lives in: House/apartment Stairs: Yes: External: 2 steps; none Has following equipment at home: Environmental consultant - 2 wheeled and Tour manager  PLOF: Independent  PATIENT GOALS: "To improve strength and balance the best that I can"   OBJECTIVE:  Note: Objective measures were completed  at Evaluation unless otherwise noted.  DIAGNOSTIC FINDINGS: MRI of lumbar spine from 02/27/23  IMPRESSION: 1. No evidence of demyelinating disease in the thoracic spine. 2. No evidence of high-grade spinal canal or neural foraminal stenosis. 3. Eccentric left disc bulge with a superimposed left paracentral disc protrusion at L5-S1 that narrows the left lateral recess and could affect the descending left S1 nerve root.  MRI of brain from 03/02/23  IMPRESSION: Normal MRI appearance of the Brain.   MRI of thoracic spine from 02/27/23  IMPRESSION: 1. No evidence of demyelinating disease in the thoracic spine. 2. No evidence of high-grade spinal canal or neural foraminal stenosis. 3. Eccentric left  disc bulge with a superimposed left paracentral disc protrusion at L5-S1 that narrows the left lateral recess and could affect the descending left S1 nerve root.  EMG of lower extremity 03/11/23 Conclusion: This is a normal study.  There is no electrodiagnostic evidence of large fiber peripheral neuropathy, or intrinsic muscle disease.  COGNITION: Overall cognitive status: Within functional limits for tasks assessed   SENSATION: Pt reports constant burning/tingling from the waist down   POSTURE: No Significant postural limitations  LOWER EXTREMITY ROM:     Active  Right Eval Left Eval  Hip flexion    Hip extension    Hip abduction    Hip adduction    Hip internal rotation    Hip external rotation    Knee flexion    Knee extension    Ankle dorsiflexion    Ankle plantarflexion    Ankle inversion    Ankle eversion     (Blank rows = not tested)  LOWER EXTREMITY MMT:  Tested in seated position   MMT Right Eval Left Eval  Hip flexion 4- 4-  Hip extension    Hip abduction 4 4  Hip adduction 4 4  Hip internal rotation    Hip external rotation    Knee flexion 4 4  Knee extension 4- 4-  Ankle dorsiflexion 4 4  Ankle plantarflexion    Ankle inversion    Ankle eversion     (Blank rows = not tested)  BED MOBILITY:  Pt reports she has been sleeping on the couch due to difficulty sleeping and difficulty getting into/out of the bed   TRANSFERS: Assistive device utilized: Environmental consultant - 2 wheeled  Sit to stand: SBA Stand to sit: SBA Chair to chair: SBA Very wide BOS and difficulty coordinating steps    GAIT: Gait pattern: step through pattern, decreased stance time- Right, decreased stride length, lateral hip instability, lateral lean- Left, decreased trunk rotation, and wide BOS Distance walked: various clinic distances  Assistive device utilized: Single point cane Level of assistance: SBA Comments: Pt ambulates w/guarded-pattern and wide BOS, decreased eccentric control of LLE   VITALS  Vitals:   04/25/23 1036  BP: 132/89  Pulse: 82                                                                                                                                 TREATMENT:    [employer]  [facility] PT Assessment - 04/25/23 1044       Berg Balance Test   Sit to Stand Able to stand without using hands and stabilize independently    Standing Unsupported Able to stand safely 2 minutes    Sitting with Back Unsupported but Feet Supported on Floor or Stool Able to sit safely and securely 2 minutes    Stand to Sit Sits safely with minimal use of hands    Transfers Able to transfer safely, minor use of hands    Standing  Unsupported with Eyes Closed Able to stand 10 seconds safely    Standing Unsupported with Feet Together Able to place feet together independently and stand for 1 minute with supervision    From Standing, Reach Forward with Outstretched Arm Can reach forward >12 cm safely (5")    From Standing Position, Pick up Object from Floor Able to pick up shoe, needs supervision    From Standing Position, Turn to Look Behind Over each Shoulder Looks behind from both sides and weight shifts well    Turn 360 Degrees Able to turn 360 degrees safely in 4 seconds or  less    Standing Unsupported, Alternately Place Feet on Step/Stool Able to stand independently and safely and complete 8 steps in 20 seconds    Standing Unsupported, One Foot in Front Able to place foot tandem independently and hold 30 seconds    Standing on One Leg Able to lift leg independently and hold > 10 seconds    Total Score 53    Berg comment: 53/56, low fall risk             NMR In half kneel on red mat on floor to work on closed chain LE strengthening: R half kneel:  Chest press 2# dowel rod x 10 reps OH lift 2# dowel rod x 10 reps L half kneel Static balance 2 x 30 sec while holding 2# weighted dowel More difficulty balancing with RLE up and difficulty maintain weight shift in midline without shifting weight onto LLE Fatigues very quickly in this position   PATIENT EDUCATION: Education details: results of Berg, PT POC with plan to likely extend POC Person educated: Patient Education method: Explanation and Demonstration Education comprehension: verbalized understanding and returned demonstration  HOME EXERCISE PROGRAM: Access Code: [email or web address] URL: [email or web address] Date: 03/24/2023 Prepared by: Peter Congo  Exercises - Supine Bridge  - 1 x daily - 7 x weekly - 3 sets - 10 reps - 5 sec hold - Clamshell  - 1 x daily - 7 x weekly - 3 sets - 10 reps - Side Stepping with Resistance at Thighs and Counter Support  - 1 x daily - 7 x weekly - 3 sets - 10 reps - Forward Backward Monster Walk with Band at Thighs and Counter Support  - 1 x daily - 7 x weekly - 3 sets - 10 reps - Cat Cow to Child's Pose  - 1 x daily - 7 x weekly - 3 sets - 10 reps  Verbally added modified thomas stretch and half kneel hip flexor stretch 2/26  GOALS: Goals reviewed with patient? Yes  SHORT TERM GOALS: Target date: 04/15/2023    Pt will improve Berg score to 52/56 for decreased fall risk Baseline: 46/56 (2/17), 53/56 (3/21) Goal status: MET  2.  Pt will improve  gait velocity to at least 2.75 ft/sec for improved gait efficiency and performance at Supervision level  Baseline: 2.2 ft/sec no AD min A (2/17); 2.78 ft/s w/SBA no AD  Goal status: MET   3.  Pt will improve FGA to 19/30 for decreased fall risk  Baseline: 11/30 (2/17); 16/30 93/10)  Goal status: NOT MET    LONG TERM GOALS: Target date: 04/29/2023   Pt will improve Berg score to 56/56 for decreased fall risk Baseline: 46/56 (2/17), 53/56 (3/21) Goal status: INITIAL  2.  Pt will improve gait velocity to at least 3.0 ft/sec for improved gait efficiency and performance at mod I level  Baseline: 2.2 ft/sec no  AD min A (2/17), 2.78 ft/s w/SBA no AD  Goal status: INITIAL  3.  Pt will improve 5 x STS to less than or equal to 12 seconds without UE support to demonstrate improved functional strength and transfer efficiency.  Baseline:  Goal status: INITIAL  4.  Pt will ambulate >200' on level indoor surfaces w/no AD at mod I level for improved independence Baseline: using RW Goal status: INITIAL  5.  Pt will improve FGA to 23/30 for decreased fall risk  Baseline: 11/30 (2/17); 16/30 (3/10)  Goal status: REVISED   ASSESSMENT:  CLINICAL IMPRESSION: Emphasis of skilled PT session on assessing remaining STG not assessed last visit, discussing updates since last visit as patient was able to see Dr. Shearon Stalls since last visit, and continuing to work on BLE in a closed-chain position. Pt has met 1/1 STG this date due to improving her Berg score from 46/56 to 53/56, indicating improved balance and decreased fall risk. Pt needs to call [facility] to her thoracic and lumbar MRIs scheduled and go to [facility] to have more blood work done, will follow-up with Dr. Shearon Stalls on 4/7. Pt continues to benefit from skilled PT services to work towards increasing LE strength, coordination, and endurance in order to increase her independence with functional mobility and decrease her fall risk. Continue  POC.    OBJECTIVE IMPAIRMENTS: Abnormal gait, decreased activity tolerance, decreased balance, decreased coordination, decreased knowledge of condition, decreased mobility, difficulty walking, decreased strength, impaired sensation, improper body mechanics, and pain.   ACTIVITY LIMITATIONS: carrying, lifting, bending, sitting, standing, squatting, sleeping, stairs, transfers, bed mobility, bathing, hygiene/grooming, locomotion level, and caring for others  PARTICIPATION LIMITATIONS: cleaning, laundry, interpersonal relationship, driving, shopping, community activity, occupation, and yard work  PERSONAL FACTORS: Past/current experiences, Transportation, and 1 comorbidity: impaired sensation in BLEs  are also affecting patient's functional outcome.   REHAB POTENTIAL: Good  CLINICAL DECISION MAKING: Evolving/moderate complexity  EVALUATION COMPLEXITY: Moderate  PLAN:  PT FREQUENCY: 1-2x/week  PT DURATION: 6 weeks  PLANNED INTERVENTIONS: 97164- PT Re-evaluation, 97110-Therapeutic exercises, 97530- Therapeutic activity, 97112- Neuromuscular re-education, 97535- Self Care, 09811- Manual therapy, [phone]- Gait training, [phone]- Orthotic Fit/training, [phone]- Aquatic Therapy, [phone]- Electrical stimulation (manual), Patient/Family education, Balance training, Stair training, Dry Needling, Joint mobilization, Spinal mobilization, and DME instructions  PLAN FOR NEXT SESSION: 10th PN, did she get thoracic and lumbar MRI scheduled? Blood work? add to HEP to work on functional strength of BLEs and balance deficits (hip strengthening - SLS, lunges, resist sit to stands, step taps/step ups and balance based on FGA and BBS deficits - EC, tandem, stepping over obstacles, head turns). Lumbar extension. Core (planks? Superman, supine march), talked about extending POC and adding visits after she sees Dr. Shearon Stalls again and has her imaging performed   Peter Congo, PT Peter Congo, PT, DPT,  CSRS  04/25/2023, 11:02 AM

## 2023-04-28 ENCOUNTER — Ambulatory Visit: Payer: PRIVATE HEALTH INSURANCE | Admitting: Physical Therapy

## 2023-04-28 DIAGNOSIS — R2689 Other abnormalities of gait and mobility: Secondary | ICD-10-CM | POA: Diagnosis not present

## 2023-04-28 DIAGNOSIS — R2681 Unsteadiness on feet: Secondary | ICD-10-CM

## 2023-04-28 DIAGNOSIS — M6281 Muscle weakness (generalized): Secondary | ICD-10-CM

## 2023-04-28 NOTE — Therapy (Signed)
 OUTPATIENT PHYSICAL THERAPY NEURO TREATMENT- 10TH VISIT PROGRESS NOTE AND RECERTIFICATION   Patient Name: [name] MRN: [id number] DOB:Mar 29, 1985, 38 y.o., female Today's Date: 04/28/2023   PCP: Octavia Heir, NP REFERRING PROVIDER: Amador Cunas, PA-C  Physical Therapy Progress Note   Dates of Reporting Period:03/18/23 - 04/28/23  See Note below for Objective Data and Assessment of Progress/Goals.    END OF SESSION:  PT End of Session - 04/28/23 1021     Visit Number 10    Number of Visits 25   Recert   Date for PT Re-Evaluation 06/09/23    Authorization Type [facility]    PT Start Time 1019    PT Stop Time 1059    PT Time Calculation (min) 40 min    Equipment Utilized During Treatment --    Activity Tolerance Patient tolerated treatment well    Behavior During Therapy WFL for tasks assessed/performed                Past Medical History:  Diagnosis Date   Anemia    Anxiety    Back pain    Constipation    Hashimoto's disease    History of chicken pox    HSV infection    Joint pain    Lactose intolerance    Lower back pain    Palpitations    PCOS (polycystic ovarian syndrome)    PCOS (polycystic ovarian syndrome)    Right hip pain    SOBOE (shortness of breath on exertion)    Thyroid condition    Velamentous insertion of umbilical cord    Vitamin D deficiency    Vitiligo    Past Surgical History:  Procedure Laterality Date   NO PAST SURGERIES     PERINEAL LACERATION REPAIR N/A 03/01/2020   Procedure: SUTURE REPAIR PERINEAL LACERATION;  Surgeon: Candice Camp, MD;  Location: [facility];  Service: Gynecology;  Laterality: N/A;   Patient Active Problem List   Diagnosis Date Noted   Fibroids 04/21/2023   Urinary incontinence 04/14/2023   Generalized edema 04/14/2023   Sensory loss 04/14/2023   Gait abnormality 03/03/2023   Paresthesia 03/03/2023   Seasonal and perennial allergic rhinitis 12/30/2022   Seasonal allergic conjunctivitis 12/30/2022    Poor sleep 07/25/2022   Weight gain 07/25/2022   Pure hypercholesterolemia 03/20/2022   Generalized obesity with starting BMI 32 03/06/2022   Low back pain 03/06/2022   Vitamin D deficiency 03/06/2022   SOBOE (shortness of breath on exertion) 03/06/2022   Other fatigue 03/06/2022   Anxiety 07/31/2021   Gastroesophageal reflux disease without esophagitis 07/31/2021   Genital herpes simplex 07/31/2021   History of abnormal cervical Papanicolaou smear 07/31/2021   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   Subclinical hyperthyroidism 07/31/2021   Twin pregnancy 07/31/2021   Polycystic ovary syndrome 07/31/2021   Hashimoto's disease 05/21/2021   Thyroid antibody positive 01/24/2021   Vitiligo 01/24/2021   Pregnancy 02/29/2020   Vaginal tear resulting from childbirth 02/29/2020   Hyperandrogenemia 02/04/2012   PCOS (polycystic ovarian syndrome) 02/04/2012    ONSET DATE: 03/12/2023 (referral)   REFERRING DIAG: R29.898 (ICD-10-CM) - Bilateral leg weakness R20.2 (ICD-10-CM) - Paresthesia  THERAPY DIAG:  Other abnormalities of gait and mobility  Muscle weakness (generalized)  Unsteadiness on feet  Rationale for Evaluation and Treatment: Rehabilitation  SUBJECTIVE:  SUBJECTIVE STATEMENT: Pt reports that she started taking full dosage of Gabapentin a couple days early, feels a bit groggy when she first wakes up but ultimately Gabapentin is helping.   Is going to go back to work on 4/15, was told the fewest hours she could work is 36 so she is "gonna see what happens".   Pt reports she still feels spasms in her R hip and ankle as well as a "tightness" around her legs.   Pt reports she was able to drive her child to the grocery store yesterday for the first time, but is aware that she has good days and bad  days and will not drive if her legs are feeling really bad.    Per PT eval: Pt presents w/RW and reports she has been using it for a few weeks. 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 Pt did not think much of it at the time, but by the next day, it had spread to her LLE and up to her umbilicus. Pt reports she has a constant burning sensation distal to her umbilicus and did have foot drop but her strength is slowly returning. States she feels like she has an egg in her anus and it is extremely uncomfortable to sit. Reports she feels like she has a tourniquet on her legs and tape wrapped around her ankles. BP fluctuates at times and she has random clamminess and sweatiness. Unsure if this is due to blood glucose. Has had multiple imaging and tests performed, including MRI, lumbar puncture and EMG. All came back normal.    Pt accompanied by: self  PERTINENT HISTORY: None relevant on file   PAIN:  Are you having pain? No Pt reports feeling "contracted" every where and a constant burning from her waist down   PRECAUTIONS: Fall  RED FLAGS: None   WEIGHT BEARING RESTRICTIONS: No  FALLS: Has patient fallen in last 6 months? No  LIVING ENVIRONMENT: Lives with: lives with their partner and lives with their daughter Lives in: House/apartment Stairs: Yes: External: 2 steps; none Has following equipment at home: Environmental consultant - 2 wheeled and Tour manager  PLOF: Independent  PATIENT GOALS: "To improve strength and balance the best that I can"   OBJECTIVE:  Note: Objective measures were completed at Evaluation unless otherwise noted.  DIAGNOSTIC FINDINGS: MRI of lumbar spine from 02/27/23  IMPRESSION: 1. No evidence of demyelinating disease in the thoracic spine. 2. No evidence of high-grade spinal canal or neural foraminal stenosis. 3. Eccentric left disc bulge with a superimposed left paracentral disc protrusion at L5-S1 that narrows  the left lateral recess and could affect the descending left S1 nerve root.  MRI of brain from 03/02/23  IMPRESSION: Normal MRI appearance of the Brain.   MRI of thoracic spine from 02/27/23  IMPRESSION: 1. No evidence of demyelinating disease in the thoracic spine. 2. No evidence of high-grade spinal canal or neural foraminal stenosis. 3. Eccentric left disc bulge with a superimposed left paracentral disc protrusion at L5-S1 that narrows the left lateral recess and could affect the descending left S1 nerve root.  EMG of lower extremity 03/11/23 Conclusion: This is a normal study.  There is no electrodiagnostic evidence of large fiber peripheral neuropathy, or intrinsic muscle disease.  COGNITION: Overall cognitive status: Within functional limits for tasks assessed   SENSATION: Pt reports constant burning/tingling from the waist down   POSTURE: No Significant  postural limitations  LOWER EXTREMITY ROM:     Active  Right Eval Left Eval  Hip flexion    Hip extension    Hip abduction    Hip adduction    Hip internal rotation    Hip external rotation    Knee flexion    Knee extension    Ankle dorsiflexion    Ankle plantarflexion    Ankle inversion    Ankle eversion     (Blank rows = not tested)  LOWER EXTREMITY MMT:  Tested in seated position   MMT Right Eval Left Eval  Hip flexion 4- 4-  Hip extension    Hip abduction 4 4  Hip adduction 4 4  Hip internal rotation    Hip external rotation    Knee flexion 4 4  Knee extension 4- 4-  Ankle dorsiflexion 4 4  Ankle plantarflexion    Ankle inversion    Ankle eversion    (Blank rows = not tested)  BED MOBILITY:  Pt reports she has been sleeping on the couch due to difficulty sleeping and difficulty getting into/out of the bed   TRANSFERS: Assistive device utilized: Environmental consultant - 2 wheeled  Sit to stand: SBA Stand to sit: SBA Chair to chair: SBA Very wide BOS and difficulty coordinating steps    GAIT: Gait  pattern: step through pattern, decreased stance time- Right, decreased stride length, lateral hip instability, lateral lean- Left, decreased trunk rotation, and wide BOS Distance walked: various clinic distances  Assistive device utilized: Single point cane Level of assistance: SBA Comments: Pt ambulates w/guarded-pattern and wide BOS, decreased eccentric control of LLE   VITALS  There were no vitals filed for this visit.                                                                                                                                TREATMENT:    Self-care/home management  Discussed POC moving forward and pt in agreement to add 6 more weeks of appointments at a frequency of 1x/week. Will increase back to 2x/week if needed.  Discussed return to work and goals moving forward. Pt reports she can tolerate up to 4 hours of activity at a time so will need to work on her endurance and stability while transferring pt. Pt reports she would also like to work on return to jogging if possible, so will address in clinic. Goals updated to reflect pt's personal goals.    Physical Performance - LTG assessment   [facility] PT Assessment - 04/28/23 1040       Transfers   Five time sit to stand comments  12.38s   no UE support     Functional Gait  Assessment   Gait assessed  Yes    Gait Level Surface Walks 20 ft in less than 7 sec but greater than 5.5 sec, uses assistive device, slower speed, mild gait deviations, or deviates 6-10 in outside of  the 12 in walkway width.   7s   Change in Gait Speed Able to smoothly change walking speed without loss of balance or gait deviation. Deviate no more than 6 in outside of the 12 in walkway width.    Gait with Horizontal Head Turns Performs head turns smoothly with no change in gait. Deviates no more than 6 in outside 12 in walkway width    Gait with Vertical Head Turns Performs head turns with no change in gait. Deviates no more than 6 in outside 12 in  walkway width.    Gait and Pivot Turn Pivot turns safely within 3 sec and stops quickly with no loss of balance.    Step Over Obstacle Is able to step over 2 stacked shoe boxes taped together (9 in total height) without changing gait speed. No evidence of imbalance.    Gait with Narrow Base of Support Is able to ambulate for 10 steps heel to toe with no staggering.    Gait with Eyes Closed Walks 20 ft, slow speed, abnormal gait pattern, evidence for imbalance, deviates 10-15 in outside 12 in walkway width. Requires more than 9 sec to ambulate 20 ft.   11.5s   Ambulating Backwards Walks 20 ft, slow speed, abnormal gait pattern, evidence for imbalance, deviates 10-15 in outside 12 in walkway width.   19s   Steps Alternating feet, no rail.    Total Score 25    FGA comment: low fall risk               PATIENT EDUCATION: Education details: results of goals, new goals for extended POC  Person educated: Patient Education method: Explanation and Demonstration Education comprehension: verbalized understanding and returned demonstration  HOME EXERCISE PROGRAM: Access Code: [email or web address] URL: [email or web address] Date: 03/24/2023 Prepared by: Peter Congo  Exercises - Supine Bridge  - 1 x daily - 7 x weekly - 3 sets - 10 reps - 5 sec hold - Clamshell  - 1 x daily - 7 x weekly - 3 sets - 10 reps - Side Stepping with Resistance at Thighs and Counter Support  - 1 x daily - 7 x weekly - 3 sets - 10 reps - Forward Backward Monster Walk with Band at Thighs and Counter Support  - 1 x daily - 7 x weekly - 3 sets - 10 reps - Cat Cow to Child's Pose  - 1 x daily - 7 x weekly - 3 sets - 10 reps  Verbally added modified thomas stretch and half kneel hip flexor stretch 2/26  GOALS: Goals reviewed with patient? Yes  SHORT TERM GOALS: Target date: 04/15/2023    Pt will improve Berg score to 52/56 for decreased fall risk Baseline: 46/56 (2/17), 53/56 (3/21) Goal status: MET  2.  Pt  will improve gait velocity to at least 2.75 ft/sec for improved gait efficiency and performance at Supervision level  Baseline: 2.2 ft/sec no AD min A (2/17); 2.78 ft/s w/SBA no AD  Goal status: MET   3.  Pt will improve FGA to 19/30 for decreased fall risk  Baseline: 11/30 (2/17); 16/30 93/10)  Goal status: NOT MET    LONG TERM GOALS: Target date: 04/29/2023   Pt will improve Berg score to 56/56 for decreased fall risk Baseline: 46/56 (2/17), 53/56 (3/21) Goal status: NOT MET  2.  Pt will improve gait velocity to at least 3.0 ft/sec for improved gait efficiency and performance at mod I level  Baseline: 2.2 ft/sec no AD min  A (2/17), 2.78 ft/s w/SBA no AD  Goal status: IN PROGRESS   3.  Pt will improve 5 x STS to less than or equal to 12 seconds without UE support to demonstrate improved functional strength and transfer efficiency.  Baseline: 12.38s no UE support  Goal status: MET  4.  Pt will ambulate >200' on level indoor surfaces w/no AD at mod I level for improved independence Baseline: using RW Goal status: MET  5.  Pt will improve FGA to 23/30 for decreased fall risk  Baseline: 11/30 (2/17); 16/30 (3/10); 25/30  Goal status: MET  NEW SHORT TERM GOALS FOR EXTENDED POC:   Target date: 05/26/2023  Pt will demonstrate proper lifting technique of 25# object from floor w/no instability for improved stability and return to work  Baseline:  Goal status: INITIAL  2.  Pt will initiate return to running on TM for improved high amplitude movement, LE coordination and return to PLOF Baseline:  Goal status: INITIAL  3.  Pt will perform squat pivot transfer of therapist independently for improved safety at work and stability w/reaching out of BOS Baseline:  Goal status: INITIAL  NEW LONG TERM GOALS FOR EXTENDED POC:  Target date: 06/09/2023  Pt will improve gait velocity to at least 3.0 ft/sec for improved gait efficiency and performance at mod I level  Baseline: 2.2 ft/sec no AD  min A (2/17), 2.78 ft/s w/SBA no AD  Goal status: IN PROGRESS  2.  Pt will ambulate >500' over unlevel, outdoor surfaces mod I w/no AD for improved stability on unlevel terrain and independence   Baseline:  Goal status: INITIAL  3.  Pt will demonstrate proper lifting technique of 50# object from floor w/no instability for improved stability and return to work  Baseline:  Goal status: INITIAL     ASSESSMENT:  CLINICAL IMPRESSION: Emphasis of skilled PT session on LTG assessment and discussing POC moving forward. Pt has met 3 of 5 LTGs and continues to progress towards the remaining two. Pt has significantly improved her score on FGA, placing her at a low fall risk, and improved her stability on 5x STS. Pt reports that Gabapentin has been extremely helpful, to the point where she felt safe driving her son to the grocery store last week. Pt states her mobility continues to fluctuate and she will be returning to work on 4/15, so wants to work on stability w/transfers and reaching out of BOS to ensure she can perform her job duties. Pt also states wish to return to jogging if able, so updated goals to reflect pt's personal goals. Pt in agreement to drop PT frequency to 1x/week moving forward as she is able to do more at home. Continue POC.      OBJECTIVE IMPAIRMENTS: Abnormal gait, decreased activity tolerance, decreased balance, decreased coordination, decreased knowledge of condition, decreased mobility, difficulty walking, decreased strength, impaired sensation, improper body mechanics, and pain.   ACTIVITY LIMITATIONS: carrying, lifting, bending, sitting, standing, squatting, sleeping, stairs, transfers, bed mobility, bathing, hygiene/grooming, locomotion level, and caring for others  PARTICIPATION LIMITATIONS: cleaning, laundry, interpersonal relationship, driving, shopping, community activity, occupation, and yard work  PERSONAL FACTORS: Past/current experiences, Transportation, and 1  comorbidity: impaired sensation in BLEs  are also affecting patient's functional outcome.   REHAB POTENTIAL: Good  CLINICAL DECISION MAKING: Evolving/moderate complexity  EVALUATION COMPLEXITY: Moderate  PLAN:  PT FREQUENCY: 1-2x/week  PT DURATION: 6 weeks + 6 weeks (recert)   PLANNED INTERVENTIONS: 84132- PT Re-evaluation, 97110-Therapeutic exercises, 97530- Therapeutic activity,  24401- Neuromuscular re-education, [phone]- Self Care, 36644- Manual therapy, [phone]- Gait training, [phone]- Orthotic Fit/training, [phone]- Aquatic Therapy, [phone]- Electrical stimulation (manual), Patient/Family education, Balance training, Stair training, Dry Needling, Joint mobilization, Spinal mobilization, and DME instructions  PLAN FOR NEXT SESSION:  add to HEP to work on functional strength of BLEs and balance deficits (hip strengthening - SLS, lunges, resist sit to stands, step taps/step ups and balance based on FGA and BBS deficits - EC, tandem, stepping over obstacles, head turns). Lumbar extension. Core (planks? Superman, supine march), lifting, transfers    Jill Alexanders Keri Tavella, PT, DPT  04/28/2023, 11:02 AM

## 2023-05-01 ENCOUNTER — Ambulatory Visit: Payer: PRIVATE HEALTH INSURANCE | Attending: Student | Admitting: Physical Therapy

## 2023-05-01 ENCOUNTER — Other Ambulatory Visit: Payer: Self-pay

## 2023-05-01 ENCOUNTER — Encounter: Payer: Self-pay | Admitting: Physical Therapy

## 2023-05-01 DIAGNOSIS — R202 Paresthesia of skin: Secondary | ICD-10-CM | POA: Insufficient documentation

## 2023-05-01 DIAGNOSIS — R293 Abnormal posture: Secondary | ICD-10-CM | POA: Diagnosis present

## 2023-05-01 DIAGNOSIS — R279 Unspecified lack of coordination: Secondary | ICD-10-CM | POA: Insufficient documentation

## 2023-05-01 DIAGNOSIS — M6281 Muscle weakness (generalized): Secondary | ICD-10-CM | POA: Diagnosis present

## 2023-05-01 DIAGNOSIS — R29898 Other symptoms and signs involving the musculoskeletal system: Secondary | ICD-10-CM | POA: Insufficient documentation

## 2023-05-01 LAB — VITAMIN E
Vitamin E (Alpha Tocopherol): 16 mg/L (ref 5.9–19.4)
Vitamin E(Gamma Tocopherol): 0.8 mg/L (ref 0.7–4.9)

## 2023-05-01 LAB — VITAMIN B3: Nicotinamide: 7.7 ng/mL (ref 5.2–72.1)

## 2023-05-01 LAB — FOLATE: Folate: 13.9 ng/mL (ref >3.0)

## 2023-05-01 LAB — VITAMIN B1: Thiamine: 97.7 nmol/L (ref 66.5–200.0)

## 2023-05-01 LAB — VITAMIN B6: Vitamin B6: 10.2 ug/L (ref 3.4–65.2)

## 2023-05-01 NOTE — Therapy (Signed)
 OUTPATIENT PHYSICAL THERAPY FEMALE PELVIC EVALUATION   Patient Name: [name] MRN: [id number] DOB:07-Dec-1985, 38 y.o., female Today's Date: 05/01/2023  END OF SESSION:  PT End of Session - 05/01/23 0949     Visit Number 1    Number of Visits 10    Authorization Type [employer] multiplan    Authorization - Visit Number --    Authorization - Number of Visits 25    PT Start Time [id number]    PT Stop Time 0930    PT Time Calculation (min) 35 min    Activity Tolerance Patient tolerated treatment well    Behavior During Therapy WFL for tasks assessed/performed             Past Medical History:  Diagnosis Date   Anemia    Anxiety    Back pain    Constipation    Hashimoto's disease    History of chicken pox    HSV infection    Joint pain    Lactose intolerance    Lower back pain    Palpitations    PCOS (polycystic ovarian syndrome)    PCOS (polycystic ovarian syndrome)    Right hip pain    SOBOE (shortness of breath on exertion)    Thyroid condition    Velamentous insertion of umbilical cord    Vitamin D deficiency    Vitiligo    Past Surgical History:  Procedure Laterality Date   NO PAST SURGERIES     PERINEAL LACERATION REPAIR N/A 03/01/2020   Procedure: SUTURE REPAIR PERINEAL LACERATION;  Surgeon: Candice Camp, MD;  Location: [facility];  Service: Gynecology;  Laterality: N/A;   Patient Active Problem List   Diagnosis Date Noted   Fibroids 04/21/2023   Urinary incontinence 04/14/2023   Generalized edema 04/14/2023   Sensory loss 04/14/2023   Gait abnormality 03/03/2023   Paresthesia 03/03/2023   Seasonal and perennial allergic rhinitis 12/30/2022   Seasonal allergic conjunctivitis 12/30/2022   Poor sleep 07/25/2022   Weight gain 07/25/2022   Pure hypercholesterolemia 03/20/2022   Generalized obesity with starting BMI 32 03/06/2022   Low back pain 03/06/2022   Vitamin D deficiency 03/06/2022   SOBOE (shortness of breath on exertion) 03/06/2022   Other fatigue  03/06/2022   Anxiety 07/31/2021   Gastroesophageal reflux disease without esophagitis 07/31/2021   Genital herpes simplex 07/31/2021   History of abnormal cervical Papanicolaou smear 07/31/2021   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   Subclinical hyperthyroidism 07/31/2021   Twin pregnancy 07/31/2021   Polycystic ovary syndrome 07/31/2021   Hashimoto's disease 05/21/2021   Thyroid antibody positive 01/24/2021   Vitiligo 01/24/2021   Pregnancy 02/29/2020   Vaginal tear resulting from childbirth 02/29/2020   Hyperandrogenemia 02/04/2012   PCOS (polycystic ovarian syndrome) 02/04/2012    PCP: Octavia Heir, NP   REFERRING PROVIDER: Angelina Sheriff, DO   REFERRING DIAG: R32 (ICD-10-CM) - Urinary incontinence, unspecified type  THERAPY DIAG:  Unspecified lack of coordination  Muscle weakness (generalized)  Abnormal posture  Bilateral leg weakness  Paresthesia  Rationale for Evaluation and Treatment: Rehabilitation  ONSET DATE: January 22nd, 2025   SUBJECTIVE:  SUBJECTIVE STATEMENT: Patient reports to PT with urinary incontinence. She started experiencing pins and needles in her left leg on January 22nd of this year, and since then her numbness and tingling has gone into both lower extremities and up to thoracic spine (T7-T8). Urinary incontinence began after childbirth with post-void dribble, but now she is peeing more and leaking more, unsure when. She feels like she doesn't fully empty her bladder.  Fluid intake: 2 40oz stanleys daily of water  PAIN:  Are you having pain? No NPRS scale: 0/10  PRECAUTIONS: None  RED FLAGS: None   WEIGHT BEARING RESTRICTIONS: No  FALLS:  Has patient fallen in last 6 months? 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   ACTIVITY LEVEL :  enjoys walking for 20-30 minutes, yoga, core exercises at home   PLOF: Independent  PATIENT GOALS: to rule in/out muscular involvement, figure out why she is experiencing urinary symptoms   PERTINENT HISTORY:  Anemia, Anxiety, Back pain, Constipation, Hashimoto's disease, History of chicken pox, HSV infection, Joint pain, Lactose intolerance, Lower back pain, Palpitations, PCOS (polycystic ovarian syndrome), PCOS (polycystic ovarian syndrome), Right hip pain, SOBOE (shortness of breath on exertion), Thyroid condition, Velamentous insertion of umbilical cord, Vitamin D deficiency, and Vitiligo.  Sexual abuse: No  BOWEL MOVEMENT: Pain with bowel movement: No Type of bowel movement:Type (Bristol Stool Scale) 1-4, Frequency 1x/day, Strain not usually, and Splinting no Fully empty rectum: No Leakage: No Pads: No Fiber supplement/laxative Yes sometimes takes fiber supplements if constipated   URINATION: Pain with urination: No Fully empty bladder: Nohas post-void dribble Stream: Strong into weak flow  Urgency: Yes - hard to hold urgency  Frequency: every 2 hours at minimum, 1-2x/night  Leakage:  unsure when it happens, but her underwear at wet at end of day  Pads: Yes: occasionally   INTERCOURSE: currently sexually active   Ability to have vaginal penetration Yes  Pain with intercourse: Initial Penetration, During Penetration, and Deep Penetration DrynessYes  Climax: yes Marinoff Scale: 2/3  PREGNANCY: Vaginal deliveries 1 Tearing Yes: 4th degree tear   PROLAPSE: Pressure  OBJECTIVE:  Note: Objective measures were completed at Evaluation unless otherwise noted.  DIAGNOSTIC FINDINGS:    -MRI lumbar and thoracic spine without contrast 02-27-2023, with findings of left paracentral disc perfusion/disc bulge at L5-S1 with possible left S1 nerve impingement.  Multilevel small degenerative disc bulges.               -CT renal stone study, MR cervical spine with and without contrast,  and MRI brain with and without contrast 03/02/23 significant for small left incidental calcified uterine fibroid, lower cervical disc degeneration with small disc protrusions and facet hypertrophy atrophy at cervicothoracic junction; foraminal stenosis moderate left C6, mild left C7 and mild left C8    -Neurology evaluation by Dr. Terrace Arabia 03-03-2023:   - EMG done 2-25, 3 limb NCS with bilateral lower extremity EMG including bilateral lumbar paraspinals, all normal   - LP: Done 1-29,.  WBC 1, total protein 27, considered normal.                           -CT angiogram with and without contrast 03-05-2023 with findings of posterior wall enhancement suggesting fibroid; no vascular abnormalities.               -HIV, RPR, Lyme panel, drug screen, ESR, CRP, B12, ANA, heavy metal screen normal; TPO 206 from  367 02/19/23                         -- Note, elevated WBCs from 11.1-14.8 between 1-25 and 2-4; have normalized as of 2-11.  PATIENT SURVEYS:   PFIQ-7: 14  COGNITION: Overall cognitive status: Within functional limits for tasks assessed     SENSATION: Light touch: Deficits right side L2/L3   LUMBAR SPECIAL TESTS:  Single leg stance test: Positive  FUNCTIONAL TESTS:  5 times sit to stand: 20 seconds, unsteadiness   GAIT: Assistive device utilized: Single point cane Comments: mild trendelenburg   POSTURE: rounded shoulders, forward head, increased lumbar lordosis, and increased thoracic kyphosis   LUMBARAROM/PROM: within normal limits   A/PROM A/PROM  eval  Flexion   Extension   Right lateral flexion   Left lateral flexion   Right rotation   Left rotation    (Blank rows = not tested)  LOWER EXTREMITY ROM: within normal limits   Active ROM Right eval Left eval  Hip flexion    Hip extension    Hip abduction    Hip adduction    Hip internal rotation    Hip external rotation    Knee flexion    Knee extension    Ankle dorsiflexion    Ankle plantarflexion    Ankle inversion     Ankle eversion     (Blank rows = not tested)  LOWER EXTREMITY MMT:  MMT Right eval Left eval  Hip flexion 4 5  Hip extension    Hip abduction 4 4  Hip adduction 4 4  Hip internal rotation    Hip external rotation    Knee flexion 4 4  Knee extension 5 5  Ankle dorsiflexion    Ankle plantarflexion    Ankle inversion    Ankle eversion     (Blank rows = not tested) PALPATION:   General: stiffness present with squat testing, increased muscle tension noted in bilateral glutes and lumbar paraspinals/QL   Pelvic Alignment: within normal limits   Abdominal: upper chest breathing present, abdominal bracing at rest, decreased rib excursion with inhalation                 External Perineal Exam: deferred due to time                             Internal Pelvic Floor: deferred due to time   Patient confirms identification and approves PT to assess internal pelvic floor and treatment Yes - at follow up visit  No emotional/communication barriers or cognitive limitation. Patient is motivated to learn. Patient understands and agrees with treatment goals and plan. PT explains patient will be examined in standing, sitting, and lying down to see how their muscles and joints work. When they are ready, they will be asked to remove their underwear so PT can examine their perineum. The patient is also given the option of providing their own chaperone as one is not provided in our facility. The patient also has the right and is explained the right to defer or refuse any part of the evaluation or treatment including the internal exam. With the patient's consent, PT will use one gloved finger to gently assess the muscles of the pelvic floor, seeing how well it contracts and relaxes and if there is muscle symmetry. After, the patient will get dressed and PT and patient will discuss exam findings and plan  of care. PT and patient discuss plan of care, schedule, attendance policy and HEP activities.  PELVIC  MMT: deferred due to time until future visit    MMT eval  Vaginal   Internal Anal Sphincter   External Anal Sphincter   Puborectalis   Diastasis Recti   (Blank rows = not tested)        TONE: Patient reports increased tone throughout right lower extremity, some spasticity present as well with walking.  PROLAPSE: N/A  TODAY'S TREATMENT:                                                                                                                              DATE:   EVAL 05/01/23: Examination completed, findings reviewed, pt educated on POC, HEP, and self care. Pt motivated to participate in PT and agreeable to attempt recommendations.   Neuro re-ed/self care: Urge suppression technique and how to perform the urge drill when urgency is high  Bladder diary log to track urinary habits, urgency, and leakage patterns  Vaginal moisturizers: samples provided (water-based)  PATIENT EDUCATION:  Education details: Urge suppression technique and how to perform the urge drill when urgency is high, Bladder diary log to track urinary habits, urgency, and leakage patterns  Person educated: Patient Education method: Explanation, Demonstration, Tactile cues, Verbal cues, and Handouts Education comprehension: verbalized understanding, returned demonstration, verbal cues required, tactile cues required, and needs further education  HOME EXERCISE PROGRAM: Access Code: [email or web address] URL: [email or web address] Date: 05/01/2023 Prepared by: Earna Coder  Patient Education - Urinary Urge Control Techniques - Urinary Urge Control Techniques  ASSESSMENT:  CLINICAL IMPRESSION: Patient is a 38 y.o. female  who was seen today for physical therapy evaluation and treatment for urinary incontinence. She had a child 3 years ago and started noticing post-void dribble after having baby. In January, patient experienced an episode of numbness and tingling down her left leg. This has since progressed  to both legs and the lower half of her torso, from T7 down. She has sensation in the pelvis and is able to achieve orgasm and feel urgency to urinate. When her urgency is high, she will struggle to maintain continence. With sensation testing, patient felt all dermatomes in the lower extremities except in the L2/L3 nerve pathway on the right leg. Patient also walks with a single point cane and demonstrates moderate trendelenburg gait pattern. Due to time limitations, patient was provided with urge drill to decrease urge incontinence and a bladder diary log to keep track of urinary habits for 1 week. Patient reports thorough understanding of today's interventions and Pt would benefit from additional PT to further address deficits.    OBJECTIVE IMPAIRMENTS: decreased balance, decreased coordination, decreased endurance, decreased mobility, and difficulty walking.   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   are also affecting patient's functional outcome.   REHAB POTENTIAL: Good  CLINICAL DECISION MAKING: Evolving/moderate complexity  EVALUATION COMPLEXITY: Moderate   GOALS: Goals reviewed with patient? Yes  SHORT TERM GOALS: Target date: 05/29/2023  Pt will be independent with HEP.  Baseline: Goal status: INITIAL  2.  Pt will be independent  with the knack, urge suppression technique, and double voiding in order to improve bladder habits and decrease urinary incontinence.   Baseline:  Goal status: INITIAL  3.  Pt will be independent with diaphragmatic breathing and down training activities in order to improve pelvic floor relaxation.  Baseline:  Goal status: INITIAL  LONG TERM GOALS: Target date: 11/01/2023  Pt will be independent with advanced HEP.  Baseline:  Goal status: INITIAL  2.  Pt to demonstrate improved coordination of pelvic floor and breathing mechanics with 10# squat with appropriate synergistic patterns to decrease leakage at least 75% of the time.   Baseline:  Goal status: INITIAL  3.  Pt will demonstrate normal pelvic floor muscle tone and A/ROM, able to achieve 4/5 strength with contractions and 10 sec endurance, in order to provide appropriate lumbopelvic support in functional activities.   Baseline:  Goal status: INITIAL  4.  Pt will decrease frequency of nightly trips to the bathroom to 1 or less in order to get restful sleep and suggest improved urinary habits and control. Baseline:  Goal status: INITIAL  PLAN:  PT FREQUENCY: 1-2x/week  PT DURATION: 12 weeks  PLANNED INTERVENTIONS: 97110-Therapeutic exercises, 97530- Therapeutic activity, 97112- Neuromuscular re-education, 97535- Self Care, 82956- Manual therapy, Taping, Dry Needling, Joint mobilization, Spinal mobilization, Scar mobilization, Cryotherapy, and Moist heat  PLAN FOR NEXT SESSION: see how bladder diary went, perform internal pelvic floor examination, introduce pelvic floor AROM, see how urge drill went  Omar Person, PT 05/01/2023, 9:51 AM

## 2023-05-01 NOTE — Patient Instructions (Signed)
 Urge Incontinence  Ideal urination frequency is every 2-4 wakeful hours, which equates to 5-8 times within a 24-hour period.   Urge incontinence is leakage that occurs when the bladder muscle contracts, creating a sudden need to go before getting to the bathroom.   Going too often when your bladder isn't actually full can disrupt the body's automatic signals to store and hold urine longer, which will increase urgency/frequency.  In this case, the bladder "is running the show" and strategies can be learned to retrain this pattern.   One should be able to control the first urge to urinate, at around .  The bladder can hold up to a "grande latte," or . To help you gain control, practice the Urge Drill below when urgency strikes.  This drill will help retrain your bladder signals and allow you to store and hold urine longer.  The overall goal is to stretch out your time between voids to reach a more manageable voiding schedule.    Practice your "quick flicks" often throughout the day (each waking hour) even when you don't need feel the urge to go.  This will help strengthen your pelvic floor muscles, making them more effective in controlling leakage.  Urge Drill  When you feel an urge to go, follow these steps to regain control: Stop what you are doing and be still Take one deep breath, directing your air into your abdomen Think an affirming thought, such as "I've got this." Do 5 quick flicks of your pelvic floor Walk with control to the bathroom to void, or delay voiding   Record of Bowel and Bladder Function Name         Date____________  Time of Day   Type & Amount of Food & Fluid Intake Elimination U = Urinate BM= bowel movement type Amount of Leakage S /M /L  Was Urge Present 1 /2 /3 Activity With Leakage/ notes  Midnight       1:00 am       2:00 am       3:00 am        4:00 am       5:00 am       6:00 am       7:00 am       8:00 am       9:00 am       10:00 am        11:00 am       Noon       1:00 pm       2:00 pm       3:00 pm       4:00 pm       5:00 pm       6:00 pm       7:00 pm       8:00 pm       9:00 pm       10:00 pm       11:00 pm        Comments                Number of pads used today

## 2023-05-02 ENCOUNTER — Ambulatory Visit: Payer: PRIVATE HEALTH INSURANCE | Admitting: Physical Therapy

## 2023-05-02 DIAGNOSIS — R2681 Unsteadiness on feet: Secondary | ICD-10-CM

## 2023-05-02 DIAGNOSIS — M6281 Muscle weakness (generalized): Secondary | ICD-10-CM

## 2023-05-02 DIAGNOSIS — R2689 Other abnormalities of gait and mobility: Secondary | ICD-10-CM

## 2023-05-02 NOTE — Therapy (Signed)
 OUTPATIENT PHYSICAL THERAPY NEURO TREATMENT   Patient Name: [name] MRN: [id number] DOB:1985-03-21, 38 y.o., female Today's Date: 05/02/2023   PCP: Octavia Heir, NP REFERRING PROVIDER: Amador Cunas, PA-C     END OF SESSION:  PT End of Session - 05/02/23 1026     Visit Number 11    Number of Visits 25   Recert   Date for PT Re-Evaluation 06/09/23    Authorization Type [employer]    PT Start Time 1025   Pt arrived late   PT Stop Time 1100    PT Time Calculation (min) 35 min    Activity Tolerance Patient tolerated treatment well    Behavior During Therapy WFL for tasks assessed/performed                 Past Medical History:  Diagnosis Date   Anemia    Anxiety    Back pain    Constipation    Hashimoto's disease    History of chicken pox    HSV infection    Joint pain    Lactose intolerance    Lower back pain    Palpitations    PCOS (polycystic ovarian syndrome)    PCOS (polycystic ovarian syndrome)    Right hip pain    SOBOE (shortness of breath on exertion)    Thyroid condition    Velamentous insertion of umbilical cord    Vitamin D deficiency    Vitiligo    Past Surgical History:  Procedure Laterality Date   NO PAST SURGERIES     PERINEAL LACERATION REPAIR N/A 03/01/2020   Procedure: SUTURE REPAIR PERINEAL LACERATION;  Surgeon: Candice Camp, MD;  Location: [facility];  Service: Gynecology;  Laterality: N/A;   Patient Active Problem List   Diagnosis Date Noted   Fibroids 04/21/2023   Urinary incontinence 04/14/2023   Generalized edema 04/14/2023   Sensory loss 04/14/2023   Gait abnormality 03/03/2023   Paresthesia 03/03/2023   Seasonal and perennial allergic rhinitis 12/30/2022   Seasonal allergic conjunctivitis 12/30/2022   Poor sleep 07/25/2022   Weight gain 07/25/2022   Pure hypercholesterolemia 03/20/2022   Generalized obesity with starting BMI 32 03/06/2022   Low back pain 03/06/2022   Vitamin D deficiency 03/06/2022   SOBOE  (shortness of breath on exertion) 03/06/2022   Other fatigue 03/06/2022   Anxiety 07/31/2021   Gastroesophageal reflux disease without esophagitis 07/31/2021   Genital herpes simplex 07/31/2021   History of abnormal cervical Papanicolaou smear 07/31/2021   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   Subclinical hyperthyroidism 07/31/2021   Twin pregnancy 07/31/2021   Polycystic ovary syndrome 07/31/2021   Hashimoto's disease 05/21/2021   Thyroid antibody positive 01/24/2021   Vitiligo 01/24/2021   Pregnancy 02/29/2020   Vaginal tear resulting from childbirth 02/29/2020   Hyperandrogenemia 02/04/2012   PCOS (polycystic ovarian syndrome) 02/04/2012    ONSET DATE: 03/12/2023 (referral)   REFERRING DIAG: R29.898 (ICD-10-CM) - Bilateral leg weakness R20.2 (ICD-10-CM) - Paresthesia  THERAPY DIAG:  Muscle weakness (generalized)  Other abnormalities of gait and mobility  Unsteadiness on feet  Rationale for Evaluation and Treatment: Rehabilitation  SUBJECTIVE:  SUBJECTIVE STATEMENT: Pt reports she tried to take a shower prior to session and took a longer time than she anticipated, causing her to be late. Still having good days and bad days. Is worried about her ability to return to work as she has days she wakes up and just needs  to go to bed and does not feel safe driving.     Per PT eval: Pt presents w/RW and reports she has been using it for a few weeks. 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 Pt did not think much of it at the time, but by the next day, it had spread to her LLE and up to her umbilicus. Pt reports she has a constant burning sensation distal to her umbilicus and did have foot drop but her strength is slowly returning. States she feels like  she has an egg in her anus and it is extremely uncomfortable to sit. Reports she feels like she has a tourniquet on her legs and tape wrapped around her ankles. BP fluctuates at times and she has random clamminess and sweatiness. Unsure if this is due to blood glucose. Has had multiple imaging and tests performed, including MRI, lumbar puncture and EMG. All came back normal.    Pt accompanied by: self  PERTINENT HISTORY: None relevant on file   PAIN:  Are you having pain? No Pt reports feeling "contracted" every where and a constant burning from her waist down   PRECAUTIONS: Fall  RED FLAGS: None   WEIGHT BEARING RESTRICTIONS: No  FALLS: Has patient fallen in last 6 months? No  LIVING ENVIRONMENT: Lives with: lives with their partner and lives with their daughter Lives in: House/apartment Stairs: Yes: External: 2 steps; none Has following equipment at home: Environmental consultant - 2 wheeled and Tour manager  PLOF: Independent  PATIENT GOALS: "To improve strength and balance the best that I can"   OBJECTIVE:  Note: Objective measures were completed at Evaluation unless otherwise noted.  DIAGNOSTIC FINDINGS: MRI of lumbar spine from 02/27/23  IMPRESSION: 1. No evidence of demyelinating disease in the thoracic spine. 2. No evidence of high-grade spinal canal or neural foraminal stenosis. 3. Eccentric left disc bulge with a superimposed left paracentral disc protrusion at L5-S1 that narrows the left lateral recess and could affect the descending left S1 nerve root.  MRI of brain from 03/02/23  IMPRESSION: Normal MRI appearance of the Brain.   MRI of thoracic spine from 02/27/23  IMPRESSION: 1. No evidence of demyelinating disease in the thoracic spine. 2. No evidence of high-grade spinal canal or neural foraminal stenosis. 3. Eccentric left disc bulge with a superimposed left paracentral disc protrusion at L5-S1 that narrows the left lateral recess and could affect the descending  left S1 nerve root.  EMG of lower extremity 03/11/23 Conclusion: This is a normal study.  There is no electrodiagnostic evidence of large fiber peripheral neuropathy, or intrinsic muscle disease.  COGNITION: Overall cognitive status: Within functional limits for tasks assessed   SENSATION: Pt reports constant burning/tingling from the waist down   POSTURE: No Significant postural limitations  LOWER EXTREMITY ROM:     Active  Right Eval Left Eval  Hip flexion    Hip extension    Hip abduction    Hip adduction    Hip internal rotation    Hip external rotation    Knee flexion    Knee extension  Ankle dorsiflexion    Ankle plantarflexion    Ankle inversion    Ankle eversion     (Blank rows = not tested)  LOWER EXTREMITY MMT:  Tested in seated position   MMT Right Eval Left Eval  Hip flexion 4- 4-  Hip extension    Hip abduction 4 4  Hip adduction 4 4  Hip internal rotation    Hip external rotation    Knee flexion 4 4  Knee extension 4- 4-  Ankle dorsiflexion 4 4  Ankle plantarflexion    Ankle inversion    Ankle eversion    (Blank rows = not tested)  BED MOBILITY:  Pt reports she has been sleeping on the couch due to difficulty sleeping and difficulty getting into/out of the bed   TRANSFERS: Assistive device utilized: Environmental consultant - 2 wheeled  Sit to stand: SBA Stand to sit: SBA Chair to chair: SBA Very wide BOS and difficulty coordinating steps    GAIT: Gait pattern: step through pattern, decreased stance time- Right, decreased stride length, lateral hip instability, lateral lean- Left, decreased trunk rotation, and wide BOS Distance walked: various clinic distances  Assistive device utilized: Single point cane Level of assistance: SBA Comments: Pt ambulates w/guarded-pattern and wide BOS, decreased eccentric control of LLE   VITALS  There were no vitals filed for this visit.                                                                                                                                 TREATMENT:    Self-care/home management/There Act Had pt be leading therapist and perform transfers on therapist (log roll, stand pivot, gait w/CGA, managing WC parts) to prepare for return to work activities and assess safety w/transfers. Pt able to perform a min-mod A transfer on therapist but became very fatigued quickly and reported not feeling very stable w/stand pivots. Lengthy discussion regarding ways to work on stability w/transfers at home (using boyfriend) and pt verbalized understanding. Provided therapeutic listening as pt discussed anxiety w/return to work and what her upcomming MRIs will show.    PATIENT EDUCATION: Education details: Practicing transfers on bf at home  Person educated: Patient Education method: Medical illustrator Education comprehension: verbalized understanding and returned demonstration  HOME EXERCISE PROGRAM: Access Code: [email or web address] URL: [email or web address] Date: 03/24/2023 Prepared by: Peter Congo  Exercises - Supine Bridge  - 1 x daily - 7 x weekly - 3 sets - 10 reps - 5 sec hold - Clamshell  - 1 x daily - 7 x weekly - 3 sets - 10 reps - Side Stepping with Resistance at Thighs and Counter Support  - 1 x daily - 7 x weekly - 3 sets - 10 reps - Forward Backward Monster Walk with Band at Thighs and Counter Support  - 1 x daily - 7 x weekly - 3 sets - 10 reps - Cat Cow to Child's Pose  - 1  x daily - 7 x weekly - 3 sets - 10 reps  Verbally added modified thomas stretch and half kneel hip flexor stretch 2/26  GOALS: Goals reviewed with patient? Yes  SHORT TERM GOALS: Target date: 04/15/2023    Pt will improve Berg score to 52/56 for decreased fall risk Baseline: 46/56 (2/17), 53/56 (3/21) Goal status: MET  2.  Pt will improve gait velocity to at least 2.75 ft/sec for improved gait efficiency and performance at Supervision level  Baseline: 2.2 ft/sec no AD min A (2/17);  2.78 ft/s w/SBA no AD  Goal status: MET   3.  Pt will improve FGA to 19/30 for decreased fall risk  Baseline: 11/30 (2/17); 16/30 93/10)  Goal status: NOT MET    LONG TERM GOALS: Target date: 04/29/2023   Pt will improve Berg score to 56/56 for decreased fall risk Baseline: 46/56 (2/17), 53/56 (3/21) Goal status: NOT MET  2.  Pt will improve gait velocity to at least 3.0 ft/sec for improved gait efficiency and performance at mod I level  Baseline: 2.2 ft/sec no AD min A (2/17), 2.78 ft/s w/SBA no AD  Goal status: IN PROGRESS   3.  Pt will improve 5 x STS to less than or equal to 12 seconds without UE support to demonstrate improved functional strength and transfer efficiency.  Baseline: 12.38s no UE support  Goal status: MET  4.  Pt will ambulate >200' on level indoor surfaces w/no AD at mod I level for improved independence Baseline: using RW Goal status: MET  5.  Pt will improve FGA to 23/30 for decreased fall risk  Baseline: 11/30 (2/17); 16/30 (3/10); 25/30  Goal status: MET  NEW SHORT TERM GOALS FOR EXTENDED POC:   Target date: 05/26/2023  Pt will demonstrate proper lifting technique of 25# object from floor w/no instability for improved stability and return to work  Baseline:  Goal status: INITIAL  2.  Pt will initiate return to running on TM for improved high amplitude movement, LE coordination and return to PLOF Baseline:  Goal status: INITIAL  3.  Pt will perform squat pivot transfer of therapist independently for improved safety at work and stability w/reaching out of BOS Baseline:  Goal status: INITIAL  NEW LONG TERM GOALS FOR EXTENDED POC:  Target date: 06/09/2023  Pt will improve gait velocity to at least 3.0 ft/sec for improved gait efficiency and performance at mod I level  Baseline: 2.2 ft/sec no AD min A (2/17), 2.78 ft/s w/SBA no AD  Goal status: IN PROGRESS  2.  Pt will ambulate >500' over unlevel, outdoor surfaces mod I w/no AD for improved stability  on unlevel terrain and independence   Baseline:  Goal status: INITIAL  3.  Pt will demonstrate proper lifting technique of 50# object from floor w/no instability for improved stability and return to work  Baseline:  Goal status: INITIAL     ASSESSMENT:  CLINICAL IMPRESSION: Session limited due to pt's late arrival. Emphasis of skilled PT session on having pt practice transfers on therapist to prepare for return to work and determine safety w/lifts. Pt able to perform lifts at a min-light mod level but fatigued quickly and required seated rest break following pushing therapist in WC ~100'. Pt concerned about her safety w/going to work so provided therapeutic listening as appropriate. Encouraged pt to work on transfers at home using boyfriend to boost confidence and work on stability. Continue POC.     OBJECTIVE IMPAIRMENTS: Abnormal gait, decreased activity  tolerance, decreased balance, decreased coordination, decreased knowledge of condition, decreased mobility, difficulty walking, decreased strength, impaired sensation, improper body mechanics, and pain.   ACTIVITY LIMITATIONS: carrying, lifting, bending, sitting, standing, squatting, sleeping, stairs, transfers, bed mobility, bathing, hygiene/grooming, locomotion level, and caring for others  PARTICIPATION LIMITATIONS: cleaning, laundry, interpersonal relationship, driving, shopping, community activity, occupation, and yard work  PERSONAL FACTORS: Past/current experiences, Transportation, and 1 comorbidity: impaired sensation in BLEs  are also affecting patient's functional outcome.   REHAB POTENTIAL: Good  CLINICAL DECISION MAKING: Evolving/moderate complexity  EVALUATION COMPLEXITY: Moderate  PLAN:  PT FREQUENCY: 1-2x/week  PT DURATION: 6 weeks + 6 weeks (recert)   PLANNED INTERVENTIONS: 40981- PT Re-evaluation, 97110-Therapeutic exercises, 97530- Therapeutic activity, 97112- Neuromuscular re-education, 97535- Self Care,  97140- Manual therapy, [phone]- Gait training, 82956- Orthotic Fit/training, 21308- Aquatic Therapy, [phone]- Electrical stimulation (manual), Patient/Family education, Balance training, Stair training, Dry Needling, Joint mobilization, Spinal mobilization, and DME instructions  PLAN FOR NEXT SESSION:  add to HEP to work on functional strength of BLEs and balance deficits (hip strengthening - SLS, lunges, resist sit to stands, step taps/step ups and balance based on FGA and BBS deficits - EC, tandem, stepping over obstacles, head turns). Lumbar extension. Core (planks? Superman, supine march), lifting, transfers    Jill Alexanders Janitza Revuelta, PT, DPT  05/02/2023, 11:16 AM

## 2023-05-07 ENCOUNTER — Ambulatory Visit
Admission: RE | Admit: 2023-05-07 | Discharge: 2023-05-07 | Disposition: A | Discharge status: Home / Self Care | Payer: PRIVATE HEALTH INSURANCE | Source: Ambulatory Visit | Attending: Physical Medicine and Rehabilitation

## 2023-05-07 ENCOUNTER — Ambulatory Visit
Admission: RE | Admit: 2023-05-07 | Discharge: 2023-05-07 | Disposition: A | Discharge status: Home / Self Care | Payer: PRIVATE HEALTH INSURANCE | Source: Ambulatory Visit | Attending: Physical Medicine and Rehabilitation | Admitting: Physical Medicine and Rehabilitation

## 2023-05-07 DIAGNOSIS — R32 Unspecified urinary incontinence: Secondary | ICD-10-CM

## 2023-05-07 DIAGNOSIS — R269 Unspecified abnormalities of gait and mobility: Secondary | ICD-10-CM

## 2023-05-07 DIAGNOSIS — R200 Anesthesia of skin: Secondary | ICD-10-CM

## 2023-05-07 MED ORDER — GADOPICLENOL 0.5 MMOL/ML IV SOLN
8.0000 mL | Freq: Once | INTRAVENOUS | Status: AC | PRN
Start: 2023-05-07 — End: 2023-05-07
  Administered 2023-05-07: 8 mL via INTRAVENOUS

## 2023-05-08 ENCOUNTER — Telehealth: Payer: Self-pay

## 2023-05-08 NOTE — Telephone Encounter (Signed)
 Spoke to patient. Referral has been redirected to a different dermatology, Terri Piedra.

## 2023-05-08 NOTE — Telephone Encounter (Signed)
 Copied from CRM [id number]. Topic: Referral - Question >> May 08, 2023  8:27 AM Patsy Lager T wrote: Reason for CRM: Boyd Kerbs from [facility] stated the referral sent over looks like the toner was low so was not easy to read but doesn't need all that was sent. Boyd Kerbs requested that only to send a few office notes, insurance info, labs and medication list. Please fax to attn: [facility] at [phone].   Message routed to referral coordinator

## 2023-05-09 ENCOUNTER — Ambulatory Visit: Payer: PRIVATE HEALTH INSURANCE | Attending: Neurology | Admitting: Physical Therapy

## 2023-05-09 DIAGNOSIS — R2681 Unsteadiness on feet: Secondary | ICD-10-CM | POA: Insufficient documentation

## 2023-05-09 DIAGNOSIS — R2689 Other abnormalities of gait and mobility: Secondary | ICD-10-CM | POA: Diagnosis present

## 2023-05-09 DIAGNOSIS — M6281 Muscle weakness (generalized): Secondary | ICD-10-CM | POA: Diagnosis present

## 2023-05-09 NOTE — Therapy (Signed)
 OUTPATIENT PHYSICAL THERAPY NEURO TREATMENT   Patient Name: [name] MRN: [id number] DOB:Jul 05, 1985, 38 y.o., female Today's Date: 05/09/2023   PCP: Octavia Heir, NP REFERRING PROVIDER: Amador Cunas, PA-C     END OF SESSION:  PT End of Session - 05/09/23 0941     Visit Number 12    Number of Visits 25   Recert   Date for PT Re-Evaluation 06/09/23    Authorization Type [employer]    PT Start Time 0939   Pt arrived late   PT Stop Time 1018    PT Time Calculation (min) 39 min    Activity Tolerance Patient tolerated treatment well    Behavior During Therapy WFL for tasks assessed/performed                 Past Medical History:  Diagnosis Date   Anemia    Anxiety    Back pain    Constipation    Hashimoto's disease    History of chicken pox    HSV infection    Joint pain    Lactose intolerance    Lower back pain    Palpitations    PCOS (polycystic ovarian syndrome)    PCOS (polycystic ovarian syndrome)    Right hip pain    SOBOE (shortness of breath on exertion)    Thyroid condition    Velamentous insertion of umbilical cord    Vitamin D deficiency    Vitiligo    Past Surgical History:  Procedure Laterality Date   NO PAST SURGERIES     PERINEAL LACERATION REPAIR N/A 03/01/2020   Procedure: SUTURE REPAIR PERINEAL LACERATION;  Surgeon: Candice Camp, MD;  Location: [facility];  Service: Gynecology;  Laterality: N/A;   Patient Active Problem List   Diagnosis Date Noted   Fibroids 04/21/2023   Urinary incontinence 04/14/2023   Generalized edema 04/14/2023   Sensory loss 04/14/2023   Gait abnormality 03/03/2023   Paresthesia 03/03/2023   Seasonal and perennial allergic rhinitis 12/30/2022   Seasonal allergic conjunctivitis 12/30/2022   Poor sleep 07/25/2022   Weight gain 07/25/2022   Pure hypercholesterolemia 03/20/2022   Generalized obesity with starting BMI 32 03/06/2022   Low back pain 03/06/2022   Vitamin D deficiency 03/06/2022   SOBOE  (shortness of breath on exertion) 03/06/2022   Other fatigue 03/06/2022   Anxiety 07/31/2021   Gastroesophageal reflux disease without esophagitis 07/31/2021   Genital herpes simplex 07/31/2021   History of abnormal cervical Papanicolaou smear 07/31/2021   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   Subclinical hyperthyroidism 07/31/2021   Twin pregnancy 07/31/2021   Polycystic ovary syndrome 07/31/2021   Hashimoto's disease 05/21/2021   Thyroid antibody positive 01/24/2021   Vitiligo 01/24/2021   Pregnancy 02/29/2020   Vaginal tear resulting from childbirth 02/29/2020   Hyperandrogenemia 02/04/2012   PCOS (polycystic ovarian syndrome) 02/04/2012    ONSET DATE: 03/12/2023 (referral)   REFERRING DIAG: R29.898 (ICD-10-CM) - Bilateral leg weakness R20.2 (ICD-10-CM) - Paresthesia  THERAPY DIAG:  Muscle weakness (generalized)  Other abnormalities of gait and mobility  Unsteadiness on feet  Rationale for Evaluation and Treatment: Rehabilitation  SUBJECTIVE:  SUBJECTIVE STATEMENT: Pt presents without AD. States she is feeling more agile and better today. Legs still feel tight. Still does not have the results of her MRI but is happy she got them. Has decided she is not returning to work yet, is extending her short term disability and then might go back prn.    Per PT eval: Pt presents w/RW and reports she has been using it for a few weeks. 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 Pt did not think much of it at the time, but by the next day, it had spread to her LLE and up to her umbilicus. Pt reports she has a constant burning sensation distal to her umbilicus and did have foot drop but her strength is slowly returning. States she feels like she has an egg in  her anus and it is extremely uncomfortable to sit. Reports she feels like she has a tourniquet on her legs and tape wrapped around her ankles. BP fluctuates at times and she has random clamminess and sweatiness. Unsure if this is due to blood glucose. Has had multiple imaging and tests performed, including MRI, lumbar puncture and EMG. All came back normal.    Pt accompanied by: self  PERTINENT HISTORY: None relevant on file   PAIN:  Are you having pain? No Pt reports feeling "contracted" every where and a constant burning from her waist down   PRECAUTIONS: Fall  RED FLAGS: None   WEIGHT BEARING RESTRICTIONS: No  FALLS: Has patient fallen in last 6 months? No  LIVING ENVIRONMENT: Lives with: lives with their partner and lives with their daughter Lives in: House/apartment Stairs: Yes: External: 2 steps; none Has following equipment at home: Environmental consultant - 2 wheeled and Tour manager  PLOF: Independent  PATIENT GOALS: "To improve strength and balance the best that I can"   OBJECTIVE:  Note: Objective measures were completed at Evaluation unless otherwise noted.  DIAGNOSTIC FINDINGS: MRI of lumbar spine from 02/27/23  IMPRESSION: 1. No evidence of demyelinating disease in the thoracic spine. 2. No evidence of high-grade spinal canal or neural foraminal stenosis. 3. Eccentric left disc bulge with a superimposed left paracentral disc protrusion at L5-S1 that narrows the left lateral recess and could affect the descending left S1 nerve root.  MRI of brain from 03/02/23  IMPRESSION: Normal MRI appearance of the Brain.   MRI of thoracic spine from 02/27/23  IMPRESSION: 1. No evidence of demyelinating disease in the thoracic spine. 2. No evidence of high-grade spinal canal or neural foraminal stenosis. 3. Eccentric left disc bulge with a superimposed left paracentral disc protrusion at L5-S1 that narrows the left lateral recess and could affect the descending left S1 nerve  root.  EMG of lower extremity 03/11/23 Conclusion: This is a normal study.  There is no electrodiagnostic evidence of large fiber peripheral neuropathy, or intrinsic muscle disease.  COGNITION: Overall cognitive status: Within functional limits for tasks assessed   SENSATION: Pt reports constant burning/tingling from the waist down   POSTURE: No Significant postural limitations  LOWER EXTREMITY ROM:     Active  Right Eval Left Eval  Hip flexion    Hip extension    Hip abduction    Hip adduction    Hip internal rotation    Hip external rotation    Knee flexion    Knee extension    Ankle dorsiflexion    Ankle plantarflexion  Ankle inversion    Ankle eversion     (Blank rows = not tested)  LOWER EXTREMITY MMT:  Tested in seated position   MMT Right Eval Left Eval  Hip flexion 4- 4-  Hip extension    Hip abduction 4 4  Hip adduction 4 4  Hip internal rotation    Hip external rotation    Knee flexion 4 4  Knee extension 4- 4-  Ankle dorsiflexion 4 4  Ankle plantarflexion    Ankle inversion    Ankle eversion    (Blank rows = not tested)  BED MOBILITY:  Pt reports she has been sleeping on the couch due to difficulty sleeping and difficulty getting into/out of the bed   TRANSFERS: Assistive device utilized: Environmental consultant - 2 wheeled  Sit to stand: SBA Stand to sit: SBA Chair to chair: SBA Very wide BOS and difficulty coordinating steps    GAIT: Gait pattern: step through pattern, decreased stance time- Right, decreased stride length, lateral hip instability, lateral lean- Left, decreased trunk rotation, and wide BOS Distance walked: various clinic distances  Assistive device utilized: Single point cane Level of assistance: SBA Comments: Pt ambulates w/guarded-pattern and wide BOS, decreased eccentric control of LLE   VITALS  There were no vitals filed for this visit.                                                                                                                                 TREATMENT:    Self-care/home management Briefly reviewed imaging (results have not been read) and POC moving forward. Pt reports she feels better just having updated imaging done and will see Dr. Shearon Stalls and Urologist next week.   NMR  The following treadmill training was completed for aerobic/neural priming, endurance, and gait speed.  - Warmup: 2:00 up to 1.0 mph w/BUE support. Noted decreased step clearance w/RLE  - HIIT: 5:00 30sec ON/OFF alternating large blue theraball kicks and normal gait at 1.0-1.6 mph. Pt reported feeling "really good" w/increased speed and enjoyed biofeedback provided by ball   -cool down: 90 seconds at 1.6 mph.  - pt reported feeling fatigued following activity but was able to step off treadmill independently  On trampoline for improved LE coordination, high amplitude movement and single leg stability to facilitate return to running. All performed w/BUE support and SBA:  Jump in place, x10 reps   Alt in/outs, x7 reps per side  Scissor jumps, x6 reps per side   Jump w/quarter turn, x1 rep w/multiple small jumps required to fully complete turn  Jump forward/back, x10 reps Alt single leg jumps w/march, x8 reps per side   PATIENT EDUCATION: Education details: Continue working on walking at home  Person educated: Patient Education method: Medical illustrator Education comprehension: verbalized understanding and returned demonstration  HOME EXERCISE PROGRAM: Access Code: [email or web address] URL: [email or web address] Date: 03/24/2023 Prepared by: Peter Congo  Exercises - Supine Bridge  - 1  x daily - 7 x weekly - 3 sets - 10 reps - 5 sec hold - Clamshell  - 1 x daily - 7 x weekly - 3 sets - 10 reps - Side Stepping with Resistance at Thighs and Counter Support  - 1 x daily - 7 x weekly - 3 sets - 10 reps - Forward Backward Monster Walk with Band at Thighs and Counter Support  - 1 x daily - 7 x weekly - 3 sets  - 10 reps - Cat Cow to Child's Pose  - 1 x daily - 7 x weekly - 3 sets - 10 reps  Verbally added modified thomas stretch and half kneel hip flexor stretch 2/26  GOALS: Goals reviewed with patient? Yes  SHORT TERM GOALS: Target date: 04/15/2023    Pt will improve Berg score to 52/56 for decreased fall risk Baseline: 46/56 (2/17), 53/56 (3/21) Goal status: MET  2.  Pt will improve gait velocity to at least 2.75 ft/sec for improved gait efficiency and performance at Supervision level  Baseline: 2.2 ft/sec no AD min A (2/17); 2.78 ft/s w/SBA no AD  Goal status: MET   3.  Pt will improve FGA to 19/30 for decreased fall risk  Baseline: 11/30 (2/17); 16/30 93/10)  Goal status: NOT MET    LONG TERM GOALS: Target date: 04/29/2023   Pt will improve Berg score to 56/56 for decreased fall risk Baseline: 46/56 (2/17), 53/56 (3/21) Goal status: NOT MET  2.  Pt will improve gait velocity to at least 3.0 ft/sec for improved gait efficiency and performance at mod I level  Baseline: 2.2 ft/sec no AD min A (2/17), 2.78 ft/s w/SBA no AD  Goal status: IN PROGRESS   3.  Pt will improve 5 x STS to less than or equal to 12 seconds without UE support to demonstrate improved functional strength and transfer efficiency.  Baseline: 12.38s no UE support  Goal status: MET  4.  Pt will ambulate >200' on level indoor surfaces w/no AD at mod I level for improved independence Baseline: using RW Goal status: MET  5.  Pt will improve FGA to 23/30 for decreased fall risk  Baseline: 11/30 (2/17); 16/30 (3/10); 25/30  Goal status: MET  NEW SHORT TERM GOALS FOR EXTENDED POC:   Target date: 05/26/2023  Pt will demonstrate proper lifting technique of 25# object from floor w/no instability for improved stability and return to work  Baseline:  Goal status: INITIAL  2.  Pt will initiate return to running on TM for improved high amplitude movement, LE coordination and return to PLOF Baseline:  Goal status:  INITIAL  3.  Pt will perform squat pivot transfer of therapist independently for improved safety at work and stability w/reaching out of BOS Baseline:  Goal status: INITIAL  NEW LONG TERM GOALS FOR EXTENDED POC:  Target date: 06/09/2023  Pt will improve gait velocity to at least 3.0 ft/sec for improved gait efficiency and performance at mod I level  Baseline: 2.2 ft/sec no AD min A (2/17), 2.78 ft/s w/SBA no AD  Goal status: IN PROGRESS  2.  Pt will ambulate >500' over unlevel, outdoor surfaces mod I w/no AD for improved stability on unlevel terrain and independence   Baseline:  Goal status: INITIAL  3.  Pt will demonstrate proper lifting technique of 50# object from floor w/no instability for improved stability and return to work  Baseline:  Goal status: INITIAL     ASSESSMENT:  CLINICAL IMPRESSION: Emphasis of  skilled PT session on high amplitude movement, agility and facilitation of return to running. Pt reports she feels a lot of relief knowing she has updated spine imaging even if they do not show anything. Pt tolerated session well but required short seated rest breaks intermittently due to fatigue. Pt most challenged by jumping in place on trampoline, but no leg instability noted. Pt reports she has poor stamina but enjoyed the biofeedback provided by theraball and trampoline during session. Continue POC.     OBJECTIVE IMPAIRMENTS: Abnormal gait, decreased activity tolerance, decreased balance, decreased coordination, decreased knowledge of condition, decreased mobility, difficulty walking, decreased strength, impaired sensation, improper body mechanics, and pain.   ACTIVITY LIMITATIONS: carrying, lifting, bending, sitting, standing, squatting, sleeping, stairs, transfers, bed mobility, bathing, hygiene/grooming, locomotion level, and caring for others  PARTICIPATION LIMITATIONS: cleaning, laundry, interpersonal relationship, driving, shopping, community activity, occupation,  and yard work  PERSONAL FACTORS: Past/current experiences, Transportation, and 1 comorbidity: impaired sensation in BLEs  are also affecting patient's functional outcome.   REHAB POTENTIAL: Good  CLINICAL DECISION MAKING: Evolving/moderate complexity  EVALUATION COMPLEXITY: Moderate  PLAN:  PT FREQUENCY: 1-2x/week  PT DURATION: 6 weeks + 6 weeks (recert)   PLANNED INTERVENTIONS: 16109- PT Re-evaluation, 97110-Therapeutic exercises, 97530- Therapeutic activity, 97112- Neuromuscular re-education, 97535- Self Care, 60454- Manual therapy, [phone]- Gait training, [phone]- Orthotic Fit/training, [phone]- Aquatic Therapy, [phone]- Electrical stimulation (manual), Patient/Family education, Balance training, Stair training, Dry Needling, Joint mobilization, Spinal mobilization, and DME instructions  PLAN FOR NEXT SESSION:  add to HEP to work on functional strength of BLEs and balance deficits (hip strengthening - SLS, lunges, resist sit to stands, step taps/step ups and balance based on FGA and BBS deficits - EC, tandem, stepping over obstacles, head turns). Lumbar extension. Core (planks? Superman, supine march), lifting, transfers, jumping    Dhyan Noah E Tonantzin Mimnaugh, PT, DPT  05/09/2023, 10:19 AM

## 2023-05-12 ENCOUNTER — Encounter: Payer: Self-pay | Admitting: Physical Medicine and Rehabilitation

## 2023-05-12 ENCOUNTER — Encounter: Payer: No Typology Code available for payment source | Admitting: Physical Medicine and Rehabilitation

## 2023-05-12 ENCOUNTER — Encounter
Payer: PRIVATE HEALTH INSURANCE | Attending: Physical Medicine and Rehabilitation | Admitting: Physical Medicine and Rehabilitation

## 2023-05-12 VITALS — BP 107/72 | HR 88 | Ht 63.0 in | Wt 193.6 lb

## 2023-05-12 DIAGNOSIS — R200 Anesthesia of skin: Secondary | ICD-10-CM

## 2023-05-12 DIAGNOSIS — R4189 Other symptoms and signs involving cognitive functions and awareness: Secondary | ICD-10-CM | POA: Diagnosis not present

## 2023-05-12 DIAGNOSIS — R269 Unspecified abnormalities of gait and mobility: Secondary | ICD-10-CM | POA: Diagnosis present

## 2023-05-12 DIAGNOSIS — F341 Dysthymic disorder: Secondary | ICD-10-CM | POA: Diagnosis not present

## 2023-05-12 MED ORDER — AMITRIPTYLINE HCL 25 MG PO TABS: ORAL_TABLET | ORAL | 2 refills | Status: DC

## 2023-05-12 MED ORDER — GABAPENTIN 300 MG PO CAPS: ORAL_CAPSULE | ORAL | 3 refills | Status: DC

## 2023-05-12 NOTE — Progress Notes (Signed)
 Subjective:    Patient ID: [name], female    DOB: 1985/06/16, 38 y.o.   MRN: [id number]  HPI  [name] is a 38 y.o. year old female  who  has a past medical history of Anemia, Anxiety, Back pain, Constipation, Hashimoto's disease, History of chicken pox, HSV infection, Joint pain, Lactose intolerance, Lower back pain, Palpitations, PCOS (polycystic ovarian syndrome), PCOS (polycystic ovarian syndrome), Right hip pain, SOBOE (shortness of breath on exertion), Thyroid condition, Velamentous insertion of umbilical cord, Vitamin D deficiency, and Vitiligo.   They are presenting to PM&R clinic for follow up related to bilateral lower extremity sensory loss starting February 26, 2023 on the right, then progressing to the left and superiorly to to T7-8.  [name]  Plan from last visit:  Sensory loss (T7/8 and below) Gait abnormality   I will be reviewing your records to see if we need to repeat your MRI vs. Other referral for your ascending sensory loss in both legs ~ T8. I will message you through mychart in the next few days regarding these recommendations.   I am switching you from Lyrica to gabapentin for sharp burning pain; start with 300 mg at nighttime and after 1 week can add 300 mg in the morning, and then after another week increase to 600 mg at night if no side effects.   Per patient, Ortho refused ESI for left S1 radiculopathy.   Continue duloxetine   Follow up with me in 1 month.     Continue PT; currently using a walker for gait and agree with ongoing use of this due to instability.         Thus far, workup significant for the below:               -MRI lumbar and thoracic spine w/ and w/o contrast 02-27-2023, with findings of left paracentral disc perfusion/disc bulge at L5-S1 with possible left S1 nerve impingement.  Multilevel small degenerative disc bulges.               -CT renal stone study, MR cervical spine with and without contrast, and MRI brain with and  without contrast 03/02/23 significant for small left incidental calcified uterine fibroid, lower cervical disc degeneration with small disc protrusions and facet hypertrophy atrophy at cervicothoracic junction; foraminal stenosis moderate left C6, mild left C7 and mild left C8    -Neurology evaluation by Dr. [name] 03-03-2023:   - EMG done 2-25, 3 limb NCS with bilateral lower extremity EMG including bilateral lumbar paraspinals, all normal   - LP: Done 1-29,.  WBC 1, total protein 27, considered normal.                           -CT angiogram with and without contrast 03-05-2023 with findings of posterior wall enhancement suggesting fibroid; no vascular abnormalities.               -HIV, RPR, Lyme panel, drug screen, ESR, CRP, B12, ANA, heavy metal screen normal; TPO 206 from 367 02/19/23                         -- Note, elevated WBCs from 11.1-14.8 between 1-25 and 2-4; have normalized as of 2-11.   -----------------------------------------------------------------------   Patient had a fairly extensive workup for her bilateral lower extremity weakness and sensory loss.  These include noncontrast MRI thoracic and lumbar spine, contrast cervical and brain  MRI, CT renal stone study, CT angiogram, inflammatory studies, screenings for infectious causes of peripheral neuropathy, heavy metal testing, and appropriately extensive EMG and LP by neurology.    Symptoms are improving with PT gradually, which is hopeful.  Noted workup has not included B1, B3, B6 or folate testing, contrast MRI of thoracic and lumbar spine ( note Artery of Adamkiewicz is in about the same dermatome as her symptoms).  Since physical exam is suggestive of neurologic changes with findings suggestive of upper motor neuron changes on the right leg, do feel that ongoing aggressive workup is warranted at this time.   Urinary incontinence, unspecified type -     Ambulatory referral to Urology Ongoing bowel bladder incontinence since sensory  loss started; agree with previous recommendations for pelvic PT, feel she would benefit from urodynamic studies to evaluate for neurogenic bladder versus overflow incontinence.  Urology referral made.     Fibroids/PCOS Generalized edema Uterine fibroid seen on multiple MRIs and followed with GYN; has medication to stop bleeding but has not started taking this yet.   No lymphadenopathy seen on imaging studies as above.   Possible symptoms related to congestion in the pelvis; patient notes concern for endometriosis, and has had issues with disruptive uterine bleeding before and ER documentation notes symptoms started during her period.  While MRI can be useful in evaluation of endometriosis, gold standard remains laparoscopy.  Will get GYNs contact information from patient and reach out to her regarding this concern..   Hashimoto's disease Has been referred to endocrinology but currently not following with anyone; TSO remains elevated but downtrending.  Managed by PCP.     Interval Hx:  - Therapies: "I continue to get stronger simply because I'm making myself". Still in PT. She can get her 38 year old in his car seat but cannot lift more than that. She is still taking lots of breaks, can tolerate 30 minutes on her feet before they start "burning"; she will sit in her car to take breaks between errands and her limit it about 3 hours.   She started Pelvic PT since last visit; she has limited appointment availability but is looking for more openings. Currently on a voiding diary. No home exercises yet.    - Follow ups: She recently referred her to a new rheumatology office. She has not talked with her Gyn recently but was meaning to call about a mammogram.    - Falls: none   - DME: She is using her single point cane for long distances but overall is independent.    - Medications: Gabapentin "takes the edge off", doesn't feel good. She feels "like my word-finding isn't great" but thinks this was  happening before the gabapentin, just worse since.   She stopped taking cymbalta, didn't notice a difference.    - Other concerns: none; is having some increased anxiety but overall is doing OK. She's sleeping "not great, but once I fall asleep I'm probably getting 5-6 hours". She's very concerned about losing her benefits and health insurance.    Pain Inventory Average Pain 6 Pain Right Now 6 My pain is constant, burning, dull, tingling, and aching  In the last 24 hours, has pain interfered with the following? General activity 10 Relation with others 10 Enjoyment of life 10 What TIME of day is your pain at its worst? evening Sleep (in general) Poor  Pain is worse with: walking, bending, sitting, and standing Pain improves with:  na Relief from Meds: 2  Family History  Problem Relation Age of Onset   Allergic rhinitis Mother    Urticaria Mother    Asthma Mother    Thyroid disease Mother    Hypertension Mother    Cancer Mother        Cervical Cancer   Heart disease Mother    Depression Mother    Anxiety disorder Mother    Obesity Mother    Hypertension Father    Hepatitis C Father    Allergic rhinitis Brother    Asthma Brother    Allergic rhinitis Brother    Urticaria Brother    Allergic rhinitis Brother    Thyroid disease Maternal Aunt    Thyroid disease Maternal Grandmother    Stroke Neg Hx    Hyperlipidemia Neg Hx    Early death Neg Hx    Social History   Socioeconomic History   Marital status: Significant Other    Spouse name: Avanell Bob   Number of children: Not on file   Years of education: 16+   Highest education level: Not on file  Occupational History   Occupation: Occupational therapist   Occupation: Acupuncturist  Tobacco Use   Smoking status: Never    Passive exposure: Past   Smokeless tobacco: Never  Vaping Use   Vaping status: Never Used  Substance and Sexual Activity   Alcohol use: Not Currently    Alcohol/week: 1.0 standard drink  of alcohol    Types: 1 Glasses of wine per week   Drug use: No   Sexual activity: Not on file  Other Topics Concern   Not on file  Social History Narrative   Regular exercise-yes Caffeine Use-yes   Social Drivers of Health   Financial Resource Strain: Not on file  Food Insecurity: Not on file  Transportation Needs: Not on file  Physical Activity: Not on file  Stress: Not on file  Social Connections: Unknown (06/03/2021)   [facility]   Social Network    Social Network: Not on file   Past Surgical History:  Procedure Laterality Date   NO PAST SURGERIES     PERINEAL LACERATION REPAIR N/A 03/01/2020   Procedure: SUTURE REPAIR PERINEAL LACERATION;  Surgeon: Belle Box, MD;  Location: [facility] LD ORS;  Service: Gynecology;  Laterality: N/A;   Past Surgical History:  Procedure Laterality Date   NO PAST SURGERIES     PERINEAL LACERATION REPAIR N/A 03/01/2020   Procedure: SUTURE REPAIR PERINEAL LACERATION;  Surgeon: Belle Box, MD;  Location: [facility] LD ORS;  Service: Gynecology;  Laterality: N/A;   Past Medical History:  Diagnosis Date   Anemia    Anxiety    Back pain    Constipation    Hashimoto's disease    History of chicken pox    HSV infection    Joint pain    Lactose intolerance    Lower back pain    Palpitations    PCOS (polycystic ovarian syndrome)    PCOS (polycystic ovarian syndrome)    Right hip pain    SOBOE (shortness of breath on exertion)    Thyroid condition    Velamentous insertion of umbilical cord    Vitamin D deficiency    Vitiligo    BP 107/72   Pulse 88   Ht 5\' 3"  (1.6 m)   Wt 193 lb 9.6 oz (87.8 kg)   SpO2 99%   BMI 34.29 kg/m   Opioid Risk Score:   Fall Risk Score:  `1  Depression screen [facility] 2/9     05/12/2023   11:12 AM 04/14/2023    2:32 PM 03/13/2023    2:31 PM 03/03/2023   10:42 AM 03/06/2022    7:39 AM 01/13/2012    1:27 PM  Depression screen PHQ 2/9  Decreased Interest 1 0 0 0 3 0  Down, Depressed,  Hopeless 1 1 0 0 3 0  PHQ - 2 Score 2 1 0 0 6 0  Altered sleeping  3   2   Tired, decreased energy  3   2   Change in appetite  0   3   Feeling bad or failure about yourself   0   1   Trouble concentrating  0   2   Moving slowly or fidgety/restless  3   0   Suicidal thoughts  0   0   PHQ-9 Score  10   16   Difficult doing work/chores  Extremely dIfficult   Somewhat difficult      Review of Systems  Musculoskeletal:  Positive for back pain.  All other systems reviewed and are negative.      Objective:   Physical Exam   PE: Constitution: Appropriate appearance for age. No apparent distress  +Obese Resp: No respiratory distress. No accessory muscle usage. on RA Cardio: Well perfused appearance. Trace bilateral peripheral edema. Abdomen: Nondistended. Nontender.   Psych: Appropriate mood and affect. Neuro: AAOx4. No apparent cognitive deficits    Neurologic Exam:   DTRs: Reflexes were decreased in the left patella and bilateral achilles; 2+ right patella, 1 beat clonus on R ankle jerk  - improved from last exam Babinsky: absent bilaterally Sensory exam: revealed normal sensation in all dermatomal regions until about T7-8 anterior and posteriorly; distally there is reduced sensation to light touch and absent light touch/pinprick discrimination throughout (perceives most light touch as sharp).  4/7: Sensory changes starting posteriorly ~T8 on the left, T5 on the right; bilateral medial ankles and calves   Motor exam: strength 5/5 throughout bilateral upper extremities and 5-/5 in R lower extremity HF, KE; 5/5 otherwise bilaterally  Coordination: Fine motor coordination was normal bilaterally Gait: Wide based, decreased stance with right and off-shifting weight quickly on that side.       Assessment & Plan:   Ziyah Cordoba is a 38 y.o. year old female  who  has a past medical history of Anemia, Anxiety, Back pain, Constipation, Hashimoto's disease, History of chicken pox,  HSV infection, Joint pain, Lactose intolerance, Lower back pain, Palpitations, PCOS (polycystic ovarian syndrome), PCOS (polycystic ovarian syndrome), Right hip pain, SOBOE (shortness of breath on exertion), Thyroid condition, Velamentous insertion of umbilical cord, Vitamin D deficiency, and Vitiligo.   They are presenting to PM&R clinic for follow up related to bilateral lower extremity sensory loss starting February 26, 2023 on the right, then progressing to the left and superiorly to to T7-8.  [name]  Sensory loss Gait abnormality -     Ambulatory referral to Neuropsychology  Continue physical therapy, as you are making clear improvements in functional gains.  Stop duloxetine given 4 weeks on this medication with no benefit; start Elavil 25 mg at night for 1 week.  Then increase to 2 tablets at night for 1 week.  Then, if no side effects, increase to 3 tablets at nighttime.  Message me in 2 to 3 weeks to let me know how you are tolerating this medication.  Continue gabapentin 300 mg in the morning and 600  mg at night; I am sending a refill for this today  Follow-up with me in 2 to 3 months.    I will message you when we have the results of your MRI back   Persistent depressive disorder Cognitive changes -     Ambulatory referral to Neuropsychology  I am sending a neuropsychology referral for cognitive changes/difficulty word finding and sensory deficits, anxiety, and possible testing for somatization disorder (patient understands and is agreeable to this).    Other orders -     Amitriptyline HCl; Take 1 tablet (25 mg total) by mouth at bedtime for 7 days, THEN 2 tablets (50 mg total) at bedtime for 7 days, THEN 3 tablets (75 mg total) at bedtime for 16 days.  Dispense: 90 tablet; Refill: 2 -     Gabapentin; Take 1 capsule (300 mg total) by mouth daily AND 2 capsules (600 mg total) at bedtime. Start with 300 mg at nighttime; if well tolerated at 1 week, can increase to 300 mg in the morning and  300 mg at nighttime, then in another week 300 mg in the morning and 600 mg at nighttime.  Dispense: 90 capsule; Refill: 3

## 2023-05-12 NOTE — Telephone Encounter (Signed)
 [name] from [facility] called for update. Advised pt requested referral to go elsewhere.

## 2023-05-12 NOTE — Patient Instructions (Signed)
 Continue physical therapy, as you are making clear improvements in functional gains.  Stop duloxetine given 4 weeks on this medication with no benefit; start Elavil 25 mg at night for 1 week.  Then increase to 2 tablets at night for 1 week.  Then, if no side effects, increase to 3 tablets at nighttime.  Message me in 2 to 3 weeks to let me know how you are tolerating this medication.  Continue gabapentin 300 mg in the morning and 600 mg at night; I am sending a refill for this today  I am sending a neuropsychology referral for cognitive changes/difficulty word finding and sensory deficits, anxiety, and possible testing for somatizations disorder.  Please schedule this with the front desk on your way out.  Follow-up with me in 2 to 3 months.    I will message you when we have the results of your MRI back

## 2023-05-13 ENCOUNTER — Ambulatory Visit (INDEPENDENT_AMBULATORY_CARE_PROVIDER_SITE_OTHER): Payer: PRIVATE HEALTH INSURANCE | Admitting: Urology

## 2023-05-13 ENCOUNTER — Encounter: Payer: Self-pay | Admitting: Urology

## 2023-05-13 VITALS — BP 115/67 | HR 86 | Ht 63.0 in | Wt 190.0 lb

## 2023-05-13 DIAGNOSIS — N3946 Mixed incontinence: Secondary | ICD-10-CM | POA: Diagnosis not present

## 2023-05-13 LAB — URINALYSIS, ROUTINE W REFLEX MICROSCOPIC
Bilirubin, UA: NEGATIVE
Glucose, UA: NEGATIVE
Ketones, UA: NEGATIVE
Leukocytes,UA: NEGATIVE
Nitrite, UA: NEGATIVE
Protein,UA: NEGATIVE
RBC, UA: NEGATIVE
Specific Gravity, UA: 1.01 (ref 1.005–1.030)
Urobilinogen, Ur: 0.2 mg/dL (ref 0.2–1.0)
pH, UA: 7 (ref 5.0–7.5)

## 2023-05-13 NOTE — Progress Notes (Signed)
 Assessment: 1. Mixed stress and urge urinary incontinence      Plan: Today I had a long discussion with the patient regarding her mixed urinary incontinence and options for further evaluation and treatment.  I have recommended that she see our incontinence specialist Dr. Alfredo Martinez.  Will make referral.  Chief Complaint: leakage of urine  History of Present Illness:  [name] is a 38 y.o. female with past medical history as noted below who is seen in consultation from [place], [place] E, NP for evaluation of urinary incontinence. Patient states that for a number of years she has had increased frequency and urgency and occasional urge incontinence.  Over the last 6 months coinciding with the onset of her recent neuropathic symptoms (extensive neg w/u thus far) she has also developed some worsening stress urinary incontinence.  She typically has leakage with exercise.  She has seen physical therapist for her neuropathy as well as a pelvic floor physical therapist but continues to have issues with incontinence.  She has had 1 vaginal birth 3 years ago.    Past Medical History:  Past Medical History:  Diagnosis Date   Anemia    Anxiety    Back pain    Constipation    Hashimoto's disease    History of chicken pox    HSV infection    Joint pain    Lactose intolerance    Lower back pain    Palpitations    PCOS (polycystic ovarian syndrome)    PCOS (polycystic ovarian syndrome)    Right hip pain    SOBOE (shortness of breath on exertion)    Thyroid condition    Velamentous insertion of umbilical cord    Vitamin D deficiency    Vitiligo     Past Surgical History:  Past Surgical History:  Procedure Laterality Date   NO PAST SURGERIES     PERINEAL LACERATION REPAIR N/A 03/01/2020   Procedure: SUTURE REPAIR PERINEAL LACERATION;  Surgeon: Candice Camp, MD;  Location: [facility] LD ORS;  Service: Gynecology;  Laterality: N/A;    Allergies:  Allergies  Allergen Reactions   Banana  Nausea And Vomiting   Spinach Nausea And Vomiting    Stomach cramps    Family History:  Family History  Problem Relation Age of Onset   Allergic rhinitis Mother    Urticaria Mother    Asthma Mother    Thyroid disease Mother    Hypertension Mother    Cancer Mother        Cervical Cancer   Heart disease Mother    Depression Mother    Anxiety disorder Mother    Obesity Mother    Hypertension Father    Hepatitis C Father    Allergic rhinitis Brother    Asthma Brother    Allergic rhinitis Brother    Urticaria Brother    Allergic rhinitis Brother    Thyroid disease Maternal Aunt    Thyroid disease Maternal Grandmother    Stroke Neg Hx    Hyperlipidemia Neg Hx    Early death Neg Hx     Social History:  Social History   Tobacco Use   Smoking status: Never    Passive exposure: Past   Smokeless tobacco: Never  Vaping Use   Vaping status: Never Used  Substance Use Topics   Alcohol use: Not Currently    Alcohol/week: 1.0 standard drink of alcohol    Types: 1 Glasses of wine per week   Drug use: No  Review of symptoms:  Constitutional:  Negative for unexplained weight loss, night sweats, fever, chills ENT:  Negative for nose bleeds, sinus pain, painful swallowing CV:  Negative for chest pain, shortness of breath, exercise intolerance, palpitations, loss of consciousness Resp:  Negative for cough, wheezing, shortness of breath GI:  Negative for nausea, vomiting, diarrhea, bloody stools GU:  Positives noted in HPI; otherwise negative for gross hematuria, dysuria, urinary incontinence Neuro:  Negative for seizures, poor balance, limb weakness, slurred speech Psych:  Negative for lack of energy, depression, anxiety Endocrine:  Negative for polydipsia, polyuria, symptoms of hypoglycemia (dizziness, hunger, sweating) Hematologic:  Negative for anemia, purpura, petechia, prolonged or excessive bleeding, use of anticoagulants  Allergic:  Negative for difficulty breathing or  choking as a result of exposure to anything; no shellfish allergy; no allergic response (rash/itch) to materials, foods  Physical exam: BP 115/67   Pulse 86   Ht 5\' 3"  (1.6 m)   Wt 190 lb (86.2 kg)   BMI 33.66 kg/m  GENERAL APPEARANCE:  Well appearing, well developed, well nourished, NAD   Results: UA clear

## 2023-05-14 ENCOUNTER — Ambulatory Visit: Payer: PRIVATE HEALTH INSURANCE | Admitting: Physical Therapy

## 2023-05-14 DIAGNOSIS — M6281 Muscle weakness (generalized): Secondary | ICD-10-CM

## 2023-05-14 DIAGNOSIS — R2681 Unsteadiness on feet: Secondary | ICD-10-CM

## 2023-05-14 DIAGNOSIS — R2689 Other abnormalities of gait and mobility: Secondary | ICD-10-CM

## 2023-05-14 NOTE — Therapy (Signed)
 OUTPATIENT PHYSICAL THERAPY NEURO TREATMENT   Patient Name: Jon Kasparek MRN: [id number] DOB:1985-10-22, 38 y.o., female Today's Date: 05/14/2023   PCP: Octavia Heir, NP REFERRING PROVIDER: Amador Cunas, PA-C     END OF SESSION:  PT End of Session - 05/14/23 1020     Visit Number 13    Number of Visits 25   Recert   Date for PT Re-Evaluation 06/09/23    Authorization Type [employer]    PT Start Time 1019    PT Stop Time 1059    PT Time Calculation (min) 40 min    Equipment Utilized During Treatment Gait belt    Activity Tolerance Patient tolerated treatment well    Behavior During Therapy WFL for tasks assessed/performed                 Past Medical History:  Diagnosis Date   Anemia    Anxiety    Back pain    Constipation    Hashimoto's disease    History of chicken pox    HSV infection    Joint pain    Lactose intolerance    Lower back pain    Palpitations    PCOS (polycystic ovarian syndrome)    PCOS (polycystic ovarian syndrome)    Right hip pain    SOBOE (shortness of breath on exertion)    Thyroid condition    Velamentous insertion of umbilical cord    Vitamin D deficiency    Vitiligo    Past Surgical History:  Procedure Laterality Date   NO PAST SURGERIES     PERINEAL LACERATION REPAIR N/A 03/01/2020   Procedure: SUTURE REPAIR PERINEAL LACERATION;  Surgeon: Candice Camp, MD;  Location: [facility] OR[facility];  Service: Gynecology;  Laterality: N/A;   Patient Active Problem List   Diagnosis Date Noted   Persistent depressive disorder 05/12/2023   Cognitive changes 05/12/2023   Fibroids 04/21/2023   Urinary incontinence 04/14/2023   Generalized edema 04/14/2023   Sensory loss 04/14/2023   Gait abnormality 03/03/2023   Paresthesia 03/03/2023   Seasonal and perennial allergic rhinitis 12/30/2022   Seasonal allergic conjunctivitis 12/30/2022   Poor sleep 07/25/2022   Weight gain 07/25/2022   Pure hypercholesterolemia 03/20/2022   Generalized  obesity with starting BMI 32 03/06/2022   Low back pain 03/06/2022   Vitamin D deficiency 03/06/2022   SOBOE (shortness of breath on exertion) 03/06/2022   Other fatigue 03/06/2022   Anxiety 07/31/2021   Gastroesophageal reflux disease without esophagitis 07/31/2021   Genital herpes simplex 07/31/2021   History of abnormal cervical Papanicolaou smear 07/31/2021   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   Subclinical hyperthyroidism 07/31/2021   Twin pregnancy 07/31/2021   Polycystic ovary syndrome 07/31/2021   Hashimoto's disease 05/21/2021   Thyroid antibody positive 01/24/2021   Vitiligo 01/24/2021   Pregnancy 02/29/2020   Vaginal tear resulting from childbirth 02/29/2020   Hyperandrogenemia 02/04/2012   PCOS (polycystic ovarian syndrome) 02/04/2012    ONSET DATE: 03/12/2023 (referral)   REFERRING DIAG: R29.898 (ICD-10-CM) - Bilateral leg weakness R20.2 (ICD-10-CM) - Paresthesia  THERAPY DIAG:  Muscle weakness (generalized)  Other abnormalities of gait and mobility  Unsteadiness on feet  Rationale for Evaluation and Treatment: Rehabilitation  SUBJECTIVE:  SUBJECTIVE STATEMENT: Pt presents without AD. Saw urologist, is being referred to an incontinence specialist. Feels like she is getting stronger but is still stressed about potentially losing her benefits in a few weeks.    Per PT eval: Pt presents w/RW and reports she has been using it for a few weeks. 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 Pt did not think much of it at the time, but by the next day, it had spread to her LLE and up to her umbilicus. Pt reports she has a constant burning sensation distal to her umbilicus and did have foot drop but her strength is slowly returning. States  she feels like she has an egg in her anus and it is extremely uncomfortable to sit. Reports she feels like she has a tourniquet on her legs and tape wrapped around her ankles. BP fluctuates at times and she has random clamminess and sweatiness. Unsure if this is due to blood glucose. Has had multiple imaging and tests performed, including MRI, lumbar puncture and EMG. All came back normal.    Pt accompanied by: self  PERTINENT HISTORY: None relevant on file   PAIN:  Are you having pain? No Pt reports feeling "contracted" every where and a constant burning from her waist down   PRECAUTIONS: Fall  RED FLAGS: None   WEIGHT BEARING RESTRICTIONS: No  FALLS: Has patient fallen in last 6 months? No  LIVING ENVIRONMENT: Lives with: lives with their partner and lives with their daughter Lives in: House/apartment Stairs: Yes: External: 2 steps; none Has following equipment at home: Environmental consultant - 2 wheeled and Tour manager  PLOF: Independent  PATIENT GOALS: "To improve strength and balance the best that I can"   OBJECTIVE:  Note: Objective measures were completed at Evaluation unless otherwise noted.  DIAGNOSTIC FINDINGS: MRI of lumbar spine from 02/27/23  IMPRESSION: 1. No evidence of demyelinating disease in the thoracic spine. 2. No evidence of high-grade spinal canal or neural foraminal stenosis. 3. Eccentric left disc bulge with a superimposed left paracentral disc protrusion at L5-S1 that narrows the left lateral recess and could affect the descending left S1 nerve root.  MRI of brain from 03/02/23  IMPRESSION: Normal MRI appearance of the Brain.   MRI of thoracic spine from 02/27/23  IMPRESSION: 1. No evidence of demyelinating disease in the thoracic spine. 2. No evidence of high-grade spinal canal or neural foraminal stenosis. 3. Eccentric left disc bulge with a superimposed left paracentral disc protrusion at L5-S1 that narrows the left lateral recess and could affect  the descending left S1 nerve root.  EMG of lower extremity 03/11/23 Conclusion: This is a normal study.  There is no electrodiagnostic evidence of large fiber peripheral neuropathy, or intrinsic muscle disease.  COGNITION: Overall cognitive status: Within functional limits for tasks assessed   SENSATION: Pt reports constant burning/tingling from the waist down   POSTURE: No Significant postural limitations  LOWER EXTREMITY ROM:     Active  Right Eval Left Eval  Hip flexion    Hip extension    Hip abduction    Hip adduction    Hip internal rotation    Hip external rotation    Knee flexion    Knee extension    Ankle dorsiflexion    Ankle plantarflexion    Ankle inversion    Ankle eversion     (Blank rows = not tested)  LOWER EXTREMITY  MMT:  Tested in seated position   MMT Right Eval Left Eval  Hip flexion 4- 4-  Hip extension    Hip abduction 4 4  Hip adduction 4 4  Hip internal rotation    Hip external rotation    Knee flexion 4 4  Knee extension 4- 4-  Ankle dorsiflexion 4 4  Ankle plantarflexion    Ankle inversion    Ankle eversion    (Blank rows = not tested)  BED MOBILITY:  Pt reports she has been sleeping on the couch due to difficulty sleeping and difficulty getting into/out of the bed   TRANSFERS: Assistive device utilized: Environmental consultant - 2 wheeled  Sit to stand: SBA Stand to sit: SBA Chair to chair: SBA Very wide BOS and difficulty coordinating steps    GAIT: Gait pattern: step through pattern, decreased stance time- Right, decreased stride length, lateral hip instability, lateral lean- Left, decreased trunk rotation, and wide BOS Distance walked: various clinic distances  Assistive device utilized: Single point cane Level of assistance: SBA Comments: Pt ambulates w/guarded-pattern and wide BOS, decreased eccentric control of LLE   VITALS  There were no vitals filed for this visit.                                                                                                                                 TREATMENT:    Self-care/home management Lengthy discussion regarding recent MD visits and POC moving forward. Provided therapeutic listening as appropriate and encouraged pt to focus on things that she can control rather than what she cannot. Pt verbalized agreement and reports she will keep therapist updated on whether or not she can continue attending sessions based on insurance coverage.   NMR  6 Blaze pods on random reach setting for improved agility, LE coordination and functional BLE strength.  Performed on 2 minute intervals and pt requires SBA guarding. Round 1:  lateral setup over 15'.  23 hits. Applied a 5s timeout to promote speed and add element of rushing to assess pt's balance w/added stressor. Increased difficulty stepping to R side but no overt LOB noted and pt laughing throughout activity.  4 Blaze pods on random reach setting for improved posterior stepping, stability w/reaching out of BOS and functional LE strength.  Performed on 2 minute intervals SBA guarding. Round 1:  4 pods placed in semicircle w/orange resistance band around pt's pelvis anchored to ballet bar.  23 hits.    PATIENT EDUCATION: Education details: Continue HEP Person educated: Patient Education method: Medical illustrator Education comprehension: verbalized understanding and returned demonstration  HOME EXERCISE PROGRAM: Access Code: [email or web address] URL: [email or web address] Date: 03/24/2023 Prepared by: Peter Congo  Exercises - Supine Bridge  - 1 x daily - 7 x weekly - 3 sets - 10 reps - 5 sec hold - Clamshell  - 1 x daily - 7 x weekly - 3 sets - 10 reps - Side Stepping with  Resistance at [employer] and Counter Support  - 1 x daily - 7 x weekly - 3 sets - 10 reps - Forward Backward Monster Walk with Band at [employer] and Counter Support  - 1 x daily - 7 x weekly - 3 sets - 10 reps - Cat Cow to Child's Pose  - 1 x daily -  7 x weekly - 3 sets - 10 reps  Verbally added modified thomas stretch and half kneel hip flexor stretch 2/26  GOALS: Goals reviewed with patient? Yes  NEW SHORT TERM GOALS FOR EXTENDED POC:   Target date: 05/26/2023  Pt will demonstrate proper lifting technique of 25# object from floor w/no instability for improved stability and return to work  Baseline:  Goal status: INITIAL  2.  Pt will initiate return to running on TM for improved high amplitude movement, LE coordination and return to PLOF Baseline:  Goal status: INITIAL  3.  Pt will perform squat pivot transfer of therapist independently for improved safety at work and stability w/reaching out of BOS Baseline:  Goal status: INITIAL  NEW LONG TERM GOALS FOR EXTENDED POC:  Target date: 06/09/2023  Pt will improve gait velocity to at least 3.0 ft/sec for improved gait efficiency and performance at mod I level  Baseline: 2.2 ft/sec no AD min A (2/17), 2.78 ft/s w/SBA no AD  Goal status: IN PROGRESS  2.  Pt will ambulate >500' over unlevel, outdoor surfaces mod I w/no AD for improved stability on unlevel terrain and independence   Baseline:  Goal status: INITIAL  3.  Pt will demonstrate proper lifting technique of 50# object from floor w/no instability for improved stability and return to work  Baseline:  Goal status: INITIAL     ASSESSMENT:  CLINICAL IMPRESSION: Emphasis of skilled PT session on providing therapeutic listening as pt voices her current stressors and feelings towards rehab, improved agility and LE coordination. Pt reports she is worried about losing her benefits but is working towards accepting what she cannot control. Pt tolerated agility drills well w/no instability noted, but pt was challenged by increased speed of movement. However, pt reports ability to do more at home and is satisfied with ability to perform today. Continue POC.     OBJECTIVE IMPAIRMENTS: Abnormal gait, decreased activity tolerance,  decreased balance, decreased coordination, decreased knowledge of condition, decreased mobility, difficulty walking, decreased strength, impaired sensation, improper body mechanics, and pain.   ACTIVITY LIMITATIONS: carrying, lifting, bending, sitting, standing, squatting, sleeping, stairs, transfers, bed mobility, bathing, hygiene/grooming, locomotion level, and caring for others  PARTICIPATION LIMITATIONS: cleaning, laundry, interpersonal relationship, driving, shopping, community activity, occupation, and yard work  PERSONAL FACTORS: Past/current experiences, Transportation, and 1 comorbidity: impaired sensation in BLEs  are also affecting patient's functional outcome.   REHAB POTENTIAL: Good  CLINICAL DECISION MAKING: Evolving/moderate complexity  EVALUATION COMPLEXITY: Moderate  PLAN:  PT FREQUENCY: 1-2x/week  PT DURATION: 6 weeks + 6 weeks (recert)   PLANNED INTERVENTIONS: 16109- PT Re-evaluation, 97110-Therapeutic exercises, 97530- Therapeutic activity, 97112- Neuromuscular re-education, 97535- Self Care, 60454- Manual therapy, [phone]- Gait training, [phone]- Orthotic Fit/training, [phone]- Aquatic Therapy, [phone]- Electrical stimulation (manual), Patient/Family education, Balance training, Stair training, Dry Needling, Joint mobilization, Spinal mobilization, and DME instructions  PLAN FOR NEXT SESSION:  add to HEP to work on functional strength of BLEs and balance deficits (hip strengthening - SLS, lunges, resist sit to stands, step taps/step ups and balance based on FGA and BBS deficits - EC, tandem, stepping over obstacles, head turns). Lumbar  extension. Core (planks? Superman, supine march), lifting, transfers, jumping    Itzae Mccurdy E Ladelle Teodoro, PT, DPT  05/14/2023, 11:54 AM

## 2023-05-14 NOTE — Addendum Note (Signed)
 Addended by: Marcha Solders C on: 05/14/2023 10:57 AM   Modules accepted: Orders

## 2023-05-16 ENCOUNTER — Ambulatory Visit: Payer: Self-pay | Admitting: Physical Therapy

## 2023-05-21 ENCOUNTER — Ambulatory Visit: Payer: PRIVATE HEALTH INSURANCE | Admitting: Physical Therapy

## 2023-05-26 ENCOUNTER — Telehealth: Payer: Self-pay | Admitting: *Deleted

## 2023-05-26 NOTE — Telephone Encounter (Signed)
 Received fax from [facility]: Please Advise.

## 2023-05-26 NOTE — Telephone Encounter (Signed)
 Lyrica  and Cymbalta  discontinued by Dr. Dorn Gaskins. New prescription for amitriptyline  and gabapentin  sent into pharmacy. Agree with new medications.

## 2023-05-28 ENCOUNTER — Ambulatory Visit: Payer: PRIVATE HEALTH INSURANCE | Admitting: Physical Therapy

## 2023-05-28 ENCOUNTER — Ambulatory Visit: Payer: PRIVATE HEALTH INSURANCE | Attending: Student | Admitting: Physical Therapy

## 2023-05-28 DIAGNOSIS — R2689 Other abnormalities of gait and mobility: Secondary | ICD-10-CM | POA: Diagnosis present

## 2023-05-28 DIAGNOSIS — M6281 Muscle weakness (generalized): Secondary | ICD-10-CM | POA: Diagnosis present

## 2023-05-28 DIAGNOSIS — R293 Abnormal posture: Secondary | ICD-10-CM | POA: Diagnosis present

## 2023-05-28 DIAGNOSIS — R279 Unspecified lack of coordination: Secondary | ICD-10-CM | POA: Diagnosis present

## 2023-05-28 DIAGNOSIS — R2681 Unsteadiness on feet: Secondary | ICD-10-CM

## 2023-05-28 NOTE — Therapy (Signed)
 OUTPATIENT PHYSICAL THERAPY NEURO TREATMENT   Patient Name: [name] MRN: [id number] DOB:[birth year], 38 y.o., female Today's Date: 05/28/2023   PCP: Arnetha Bhat, NP REFERRING PROVIDER: Amedeo Jupiter, PA-C     END OF SESSION:  PT End of Session - 05/28/23 1022     Visit Number 14    Number of Visits 25   Recert   Date for PT Re-Evaluation 06/09/23    Authorization Type [employer]    PT Start Time 1019    PT Stop Time 1057    PT Time Calculation (min) 38 min    Equipment Utilized During Treatment Gait belt    Activity Tolerance Patient tolerated treatment well    Behavior During Therapy WFL for tasks assessed/performed                 Past Medical History:  Diagnosis Date   Anemia    Anxiety    Back pain    Constipation    Hashimoto's disease    History of chicken pox    HSV infection    Joint pain    Lactose intolerance    Lower back pain    Palpitations    PCOS (polycystic ovarian syndrome)    PCOS (polycystic ovarian syndrome)    Right hip pain    SOBOE (shortness of breath on exertion)    Thyroid  condition    Velamentous insertion of umbilical cord    Vitamin D  deficiency    Vitiligo    Past Surgical History:  Procedure Laterality Date   NO PAST SURGERIES     PERINEAL LACERATION REPAIR N/A 03/01/2020   Procedure: SUTURE REPAIR PERINEAL LACERATION;  Surgeon: Belle Box, MD;  Location: [facility];  Service: Gynecology;  Laterality: N/A;   Patient Active Problem List   Diagnosis Date Noted   Persistent depressive disorder 05/12/2023   Cognitive changes 05/12/2023   Fibroids 04/21/2023   Urinary incontinence 04/14/2023   Generalized edema 04/14/2023   Sensory loss 04/14/2023   Gait abnormality 03/03/2023   Paresthesia 03/03/2023   Seasonal and perennial allergic rhinitis 12/30/2022   Seasonal allergic conjunctivitis 12/30/2022   Poor sleep 07/25/2022   Weight gain 07/25/2022   Pure hypercholesterolemia 03/20/2022    Generalized obesity with starting BMI 32 03/06/2022   Low back pain 03/06/2022   Vitamin D  deficiency 03/06/2022   SOBOE (shortness of breath on exertion) 03/06/2022   Other fatigue 03/06/2022   Anxiety 07/31/2021   Gastroesophageal reflux disease without esophagitis 07/31/2021   Genital herpes simplex 07/31/2021   History of abnormal cervical Papanicolaou smear 07/31/2021   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   Subclinical hyperthyroidism 07/31/2021   Twin pregnancy 07/31/2021   Polycystic ovary syndrome 07/31/2021   Hashimoto's disease 05/21/2021   Thyroid  antibody positive 01/24/2021   Vitiligo 01/24/2021   Pregnancy 02/29/2020   Vaginal tear resulting from childbirth 02/29/2020   Hyperandrogenemia 02/04/2012   PCOS (polycystic ovarian syndrome) 02/04/2012    ONSET DATE: 03/12/2023 (referral)   REFERRING DIAG: R29.898 (ICD-10-CM) - Bilateral leg weakness R20.2 (ICD-10-CM) - Paresthesia  THERAPY DIAG:  Muscle weakness (generalized)  Other abnormalities of gait and mobility  Unsteadiness on feet  Rationale for Evaluation and Treatment: Rehabilitation  SUBJECTIVE:  SUBJECTIVE STATEMENT: Pt presents without AD. Reports she had to cancel last week due to being uninsured. Is now on [facility] States he feels stronger every week, but still has a squeezing sensation in her legs and feet. No falls   Per PT eval: Pt presents w/RW and reports she has been using it for a few weeks. 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 Pt did not think much of it at the time, but by the next day, it had spread to her LLE and up to her umbilicus. Pt reports she has a constant burning sensation distal to her umbilicus and did have foot drop but her  strength is slowly returning. States she feels like she has an egg in her anus and it is extremely uncomfortable to sit. Reports she feels like she has a tourniquet on her legs and tape wrapped around her ankles. BP fluctuates at times and she has random clamminess and sweatiness. Unsure if this is due to blood glucose. Has had multiple imaging and tests performed, including MRI, lumbar puncture and EMG. All came back normal.    Pt accompanied by: self  PERTINENT HISTORY: None relevant on file   PAIN:  Are you having pain? No Pt reports feeling "contracted" every where and a constant burning from her waist down   PRECAUTIONS: Fall  RED FLAGS: None   WEIGHT BEARING RESTRICTIONS: No  FALLS: Has patient fallen in last 6 months? No  LIVING ENVIRONMENT: Lives with: lives with their partner and lives with their daughter Lives in: House/apartment Stairs: Yes: External: 2 steps; none Has following equipment at home: Environmental consultant - 2 wheeled and Tour manager  PLOF: Independent  PATIENT GOALS: "To improve strength and balance the best that I can"   OBJECTIVE:  Note: Objective measures were completed at Evaluation unless otherwise noted.  DIAGNOSTIC FINDINGS: MRI of lumbar spine from 02/27/23  IMPRESSION: 1. No evidence of demyelinating disease in the thoracic spine. 2. No evidence of high-grade spinal canal or neural foraminal stenosis. 3. Eccentric left disc bulge with a superimposed left paracentral disc protrusion at L5-S1 that narrows the left lateral recess and could affect the descending left S1 nerve root.  MRI of brain from 03/02/23  IMPRESSION: Normal MRI appearance of the Brain.   MRI of thoracic spine from 02/27/23  IMPRESSION: 1. No evidence of demyelinating disease in the thoracic spine. 2. No evidence of high-grade spinal canal or neural foraminal stenosis. 3. Eccentric left disc bulge with a superimposed left paracentral disc protrusion at L5-S1 that narrows the  left lateral recess and could affect the descending left S1 nerve root.  EMG of lower extremity 03/11/23 Conclusion: This is a normal study.  There is no electrodiagnostic evidence of large fiber peripheral neuropathy, or intrinsic muscle disease.  COGNITION: Overall cognitive status: Within functional limits for tasks assessed   SENSATION: Pt reports constant burning/tingling from the waist down   POSTURE: No Significant postural limitations  LOWER EXTREMITY ROM:     Active  Right Eval Left Eval  Hip flexion    Hip extension    Hip abduction    Hip adduction    Hip internal rotation    Hip external rotation    Knee flexion    Knee extension    Ankle dorsiflexion    Ankle plantarflexion    Ankle inversion    Ankle eversion     (Blank  rows = not tested)  LOWER EXTREMITY MMT:  Tested in seated position   MMT Right Eval Left Eval  Hip flexion 4- 4-  Hip extension    Hip abduction 4 4  Hip adduction 4 4  Hip internal rotation    Hip external rotation    Knee flexion 4 4  Knee extension 4- 4-  Ankle dorsiflexion 4 4  Ankle plantarflexion    Ankle inversion    Ankle eversion    (Blank rows = not tested)  BED MOBILITY:  Pt reports she has been sleeping on the couch due to difficulty sleeping and difficulty getting into/out of the bed   TRANSFERS: Assistive device utilized: Environmental consultant - 2 wheeled  Sit to stand: SBA Stand to sit: SBA Chair to chair: SBA Very wide BOS and difficulty coordinating steps    GAIT: Gait pattern: step through pattern, decreased stance time- Right, decreased stride length, lateral hip instability, lateral lean- Left, decreased trunk rotation, and wide BOS Distance walked: various clinic distances  Assistive device utilized: Single point cane Level of assistance: SBA Comments: Pt ambulates w/guarded-pattern and wide BOS, decreased eccentric control of LLE   VITALS  There were no vitals filed for this visit.                                                                                                                                 TREATMENT:    Ther Act  To assess STG, had pt lift 25# KB off ground x2 reps for improved functional strength. Pt able to perform well w/no instability, but did report mild pull in low back. 4 Blaze pods on random reach setting for improved single leg stability, LE coordination and ankle stability.  Performed on 2 minute intervals with 3 minute rest periods.  Pt requires SBA guarding and BUE support. Round 1:  w/LLE on green dynadisc and 4 pods placed from 1-6 o'clock on R side, tapping pods w/RLE setup.  62 hits. Round 2:  same setup but reverse (RLE on dynadisc).  56 hits. Notable errors/deficits:  decreased ankle stability on RLE.  On blue mat in front of rebounder for improved single leg stability, functional core stability and anticipatory balance strategies:  Pt assumed stand > tall keel position w/SBA Half kneel fwd 2kg ball tosses, x15 reps per side w/SBA. Increased difficulty stabilizing on RLE  Half kneel ball tosses w/rotation (aligned parallel to rebounder w/turn to stance leg to throw ball), x12 reps per side. Significantly more difficult for pt bilaterally, SBA throughout. Min cues for TA activation throughout.   PATIENT EDUCATION: Education details: Continue HEP Person educated: Patient Education method: Medical illustrator Education comprehension: verbalized understanding and returned demonstration  HOME EXERCISE PROGRAM: Access Code: [email or web address] URL: [email or web address] Date: 03/24/2023 Prepared by: Lorita Rosa  Exercises - Supine Bridge  - 1 x daily - 7 x weekly - 3 sets - 10 reps - 5 sec hold -  Clamshell  - 1 x daily - 7 x weekly - 3 sets - 10 reps - Side Stepping with Resistance at Thighs and Counter Support  - 1 x daily - 7 x weekly - 3 sets - 10 reps - Forward Backward Monster Walk with Band at Thighs and Counter Support  - 1 x daily - 7  x weekly - 3 sets - 10 reps - Cat Cow to Child's Pose  - 1 x daily - 7 x weekly - 3 sets - 10 reps  Verbally added modified thomas stretch and half kneel hip flexor stretch 2/26  GOALS: Goals reviewed with patient? Yes  NEW SHORT TERM GOALS FOR EXTENDED POC:   Target date: 05/26/2023  Pt will demonstrate proper lifting technique of 25# object from floor w/no instability for improved stability and return to work  Baseline:  Goal status: MET  2.  Pt will initiate return to running on TM for improved high amplitude movement, LE coordination and return to PLOF Baseline:  Goal status: NOT MET  3.  Pt will perform squat pivot transfer of therapist independently for improved safety at work and stability w/reaching out of BOS Baseline:  Goal status: DC - pt not returning to work   NEW LONG TERM GOALS FOR EXTENDED POC:  Target date: 06/09/2023  Pt will improve gait velocity to at least 3.0 ft/sec for improved gait efficiency and performance at mod I level  Baseline: 2.2 ft/sec no AD min A (2/17), 2.78 ft/s w/SBA no AD  Goal status: IN PROGRESS  2.  Pt will ambulate >500' over unlevel, outdoor surfaces mod I w/no AD for improved stability on unlevel terrain and independence   Baseline:  Goal status: INITIAL  3.  Pt will demonstrate proper lifting technique of 50# object from floor w/no instability for improved stability and return to work  Baseline:  Goal status: INITIAL     ASSESSMENT:  CLINICAL IMPRESSION: Emphasis of skilled PT session on STG assessment, single leg stability, ankle stability and core strength. Pt has met 1 of 2 STGs, demonstrating proper lift technique of 25# from ground. Did not assess return to running on TM as pt has had to cancel appointments due to insurance change. Pt is not returning to work, so DC squat pivot goal. Pt overall reports feeling stronger but still has "squeezing" sensation of legs. Continue POC.     OBJECTIVE IMPAIRMENTS: Abnormal gait,  decreased activity tolerance, decreased balance, decreased coordination, decreased knowledge of condition, decreased mobility, difficulty walking, decreased strength, impaired sensation, improper body mechanics, and pain.   ACTIVITY LIMITATIONS: carrying, lifting, bending, sitting, standing, squatting, sleeping, stairs, transfers, bed mobility, bathing, hygiene/grooming, locomotion level, and caring for others  PARTICIPATION LIMITATIONS: cleaning, laundry, interpersonal relationship, driving, shopping, community activity, occupation, and yard work  PERSONAL FACTORS: Past/current experiences, Transportation, and 1 comorbidity: impaired sensation in BLEs  are also affecting patient's functional outcome.   REHAB POTENTIAL: Good  CLINICAL DECISION MAKING: Evolving/moderate complexity  EVALUATION COMPLEXITY: Moderate  PLAN:  PT FREQUENCY: 1-2x/week  PT DURATION: 6 weeks + 6 weeks (recert)   PLANNED INTERVENTIONS: 16109- PT Re-evaluation, 97110-Therapeutic exercises, 97530- Therapeutic activity, 97112- Neuromuscular re-education, 97535- Self Care, 60454- Manual therapy, [phone]- Gait training, [phone]- Orthotic Fit/training, [phone]- Aquatic Therapy, [phone]- Electrical stimulation (manual), Patient/Family education, Balance training, Stair training, Dry Needling, Joint mobilization, Spinal mobilization, and DME instructions  PLAN FOR NEXT SESSION:  add to HEP to work on functional strength of BLEs and balance deficits (hip strengthening - SLS,  lunges, resist sit to stands, step taps/step ups and balance based on FGA and BBS deficits - EC, tandem, stepping over obstacles, head turns). Lumbar extension. Core (planks? Superman, supine march), lifting, transfers, jumping    Marlin Brys E Mathieu Schloemer, PT, DPT  05/28/2023, 11:01 AM

## 2023-05-28 NOTE — Therapy (Signed)
 OUTPATIENT PHYSICAL THERAPY FEMALE PELVIC TREATMENT   Patient Name: [name] MRN: [id number] DOB:1985-05-24, 38 y.o., female Today's Date: 05/28/2023  END OF SESSION:  PT End of Session - 05/28/23 1310     Visit Number 2    Authorization Type Medicaid [employer]    PT Start Time 1230    PT Stop Time 1310    PT Time Calculation (min) 40 min    Activity Tolerance Patient tolerated treatment well    Behavior During Therapy WFL for tasks assessed/performed              Past Medical History:  Diagnosis Date   Anemia    Anxiety    Back pain    Constipation    Hashimoto's disease    History of chicken pox    HSV infection    Joint pain    Lactose intolerance    Lower back pain    Palpitations    PCOS (polycystic ovarian syndrome)    PCOS (polycystic ovarian syndrome)    Right hip pain    SOBOE (shortness of breath on exertion)    Thyroid  condition    Velamentous insertion of umbilical cord    Vitamin D  deficiency    Vitiligo    Past Surgical History:  Procedure Laterality Date   NO PAST SURGERIES     PERINEAL LACERATION REPAIR N/A 03/01/2020   Procedure: SUTURE REPAIR PERINEAL LACERATION;  Surgeon: Belle Box, MD;  Location: [facility];  Service: Gynecology;  Laterality: N/A;   Patient Active Problem List   Diagnosis Date Noted   Persistent depressive disorder 05/12/2023   Cognitive changes 05/12/2023   Fibroids 04/21/2023   Urinary incontinence 04/14/2023   Generalized edema 04/14/2023   Sensory loss 04/14/2023   Gait abnormality 03/03/2023   Paresthesia 03/03/2023   Seasonal and perennial allergic rhinitis 12/30/2022   Seasonal allergic conjunctivitis 12/30/2022   Poor sleep 07/25/2022   Weight gain 07/25/2022   Pure hypercholesterolemia 03/20/2022   Generalized obesity with starting BMI 32 03/06/2022   Low back pain 03/06/2022   Vitamin D  deficiency 03/06/2022   SOBOE (shortness of breath on exertion) 03/06/2022   Other fatigue 03/06/2022    Anxiety 07/31/2021   Gastroesophageal reflux disease without esophagitis 07/31/2021   Genital herpes simplex 07/31/2021   History of abnormal cervical Papanicolaou smear 07/31/2021   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   Subclinical hyperthyroidism 07/31/2021   Twin pregnancy 07/31/2021   Polycystic ovary syndrome 07/31/2021   Hashimoto's disease 05/21/2021   Thyroid  antibody positive 01/24/2021   Vitiligo 01/24/2021   Pregnancy 02/29/2020   Vaginal tear resulting from childbirth 02/29/2020   Hyperandrogenemia 02/04/2012   PCOS (polycystic ovarian syndrome) 02/04/2012    PCP: Arnetha Bhat, NP   REFERRING PROVIDER: Bea Lime, DO   REFERRING DIAG: R32 (ICD-10-CM) - Urinary incontinence, unspecified type  THERAPY DIAG:  Muscle weakness (generalized)  Other abnormalities of gait and mobility  Unspecified lack of coordination  Abnormal posture  Rationale for Evaluation and Treatment: Rehabilitation  ONSET DATE: January 22nd, 2025   SUBJECTIVE:  SUBJECTIVE STATEMENT: Patient did her bladder diary - she noticed that as she drinks more coffee she pees more and leaks more. Post-void dribble is still happening. It is hard for her to feel the leakage outside of voiding, but she thinks it happens when transferring and picking up her son (35 lbs). Right hip is feeling spastic today. Pt fully consents to today's internal examination.   Eval: Patient reports to PT with urinary incontinence. She started experiencing pins and needles in her left leg on January 22nd of this year, and since then her numbness and tingling has gone into both lower extremities and up to thoracic spine (T7-T8). Urinary incontinence began after childbirth with post-void dribble, but now she is peeing more and leaking more, unsure when.  She feels like she doesn't fully empty her bladder.  Fluid intake: 2 40oz stanleys daily of water  PAIN:  Are you having pain? No NPRS scale: 0/10  PRECAUTIONS: None  RED FLAGS: None   WEIGHT BEARING RESTRICTIONS: No  FALLS:  Has patient fallen in last 6 months? 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   ACTIVITY LEVEL : enjoys walking for 20-30 minutes, yoga, core exercises at home   PLOF: Independent  PATIENT GOALS: to rule in/out muscular involvement, figure out why she is experiencing urinary symptoms   PERTINENT HISTORY:  Anemia, Anxiety, Back pain, Constipation, Hashimoto's disease, History of chicken pox, HSV infection, Joint pain, Lactose intolerance, Lower back pain, Palpitations, PCOS (polycystic ovarian syndrome), PCOS (polycystic ovarian syndrome), Right hip pain, SOBOE (shortness of breath on exertion), Thyroid  condition, Velamentous insertion of umbilical cord, Vitamin D  deficiency, and Vitiligo.  Sexual abuse: No  BOWEL MOVEMENT: Pain with bowel movement: No Type of bowel movement:Type (Bristol Stool Scale) 1-4, Frequency 1x/day, Strain not usually, and Splinting no Fully empty rectum: No Leakage: No Pads: No Fiber supplement/laxative Yes sometimes takes fiber supplements if constipated   URINATION: Pain with urination: No Fully empty bladder: Nohas post-void dribble Stream: Strong into weak flow  Urgency: Yes - hard to hold urgency  Frequency: every 2 hours at minimum, 1-2x/night  Leakage:  unsure when it happens, but her underwear at wet at end of day  Pads: Yes: occasionally   INTERCOURSE: currently sexually active   Ability to have vaginal penetration Yes  Pain with intercourse: Initial Penetration, During Penetration, and Deep Penetration DrynessYes  Climax: yes Marinoff Scale: 2/3  PREGNANCY: Vaginal deliveries 1 Tearing Yes: 4th degree tear   PROLAPSE: Pressure  OBJECTIVE:  Note: Objective measures were  completed at Evaluation unless otherwise noted.  DIAGNOSTIC FINDINGS:    -MRI lumbar and thoracic spine without contrast 02-27-2023, with findings of left paracentral disc perfusion/disc bulge at L5-S1 with possible left S1 nerve impingement.  Multilevel small degenerative disc bulges.               -CT renal stone study, MR cervical spine with and without contrast, and MRI brain with and without contrast 03/02/23 significant for small left incidental calcified uterine fibroid, lower cervical disc degeneration with small disc protrusions and facet hypertrophy atrophy at cervicothoracic junction; foraminal stenosis moderate left C6, mild left C7 and mild left C8    -Neurology evaluation by Dr. [name] 03-03-2023:   - EMG done 2-25, 3 limb NCS with bilateral lower extremity EMG including bilateral lumbar paraspinals, all normal   - LP: Done 1-29,.  WBC 1, total protein 27, considered normal.                           -  CT angiogram with and without contrast 03-05-2023 with findings of posterior wall enhancement suggesting fibroid; no vascular abnormalities.               -HIV, RPR, Lyme panel, drug screen, ESR, CRP, B12, ANA, heavy metal screen normal; TPO 206 from 367 02/19/23                         -- Note, elevated WBCs from 11.1-14.8 between 1-25 and 2-4; have normalized as of 2-11.  PATIENT SURVEYS:   PFIQ-7: 14  COGNITION: Overall cognitive status: Within functional limits for tasks assessed     SENSATION: Light touch: Deficits right side L2/L3   LUMBAR SPECIAL TESTS:  Single leg stance test: Positive  FUNCTIONAL TESTS:  5 times sit to stand: 20 seconds, unsteadiness   GAIT: Assistive device utilized: Single point cane Comments: mild trendelenburg   POSTURE: rounded shoulders, forward head, increased lumbar lordosis, and increased thoracic kyphosis   LUMBARAROM/PROM: within normal limits   A/PROM A/PROM  eval  Flexion   Extension   Right lateral flexion   Left lateral flexion    Right rotation   Left rotation    (Blank rows = not tested)  LOWER EXTREMITY ROM: within normal limits   Active ROM Right eval Left eval  Hip flexion    Hip extension    Hip abduction    Hip adduction    Hip internal rotation    Hip external rotation    Knee flexion    Knee extension    Ankle dorsiflexion    Ankle plantarflexion    Ankle inversion    Ankle eversion     (Blank rows = not tested)  LOWER EXTREMITY MMT:  MMT Right eval Left eval  Hip flexion 4 5  Hip extension    Hip abduction 4 4  Hip adduction 4 4  Hip internal rotation    Hip external rotation    Knee flexion 4 4  Knee extension 5 5  Ankle dorsiflexion    Ankle plantarflexion    Ankle inversion    Ankle eversion     (Blank rows = not tested) PALPATION:   General: stiffness present with squat testing, increased muscle tension noted in bilateral glutes and lumbar paraspinals/QL   Pelvic Alignment: within normal limits   Abdominal: upper chest breathing present, abdominal bracing at rest, decreased rib excursion with inhalation                 External Perineal Exam: deferred due to time                             Internal Pelvic Floor: deferred due to time   Patient confirms identification and approves PT to assess internal pelvic floor and treatment Yes - at follow up visit  No emotional/communication barriers or cognitive limitation. Patient is motivated to learn. Patient understands and agrees with treatment goals and plan. PT explains patient will be examined in standing, sitting, and lying down to see how their muscles and joints work. When they are ready, they will be asked to remove their underwear so PT can examine their perineum. The patient is also given the option of providing their own chaperone as one is not provided in our facility. The patient also has the right and is explained the right to defer or refuse any part of the evaluation or treatment including the internal  exam. With  the patient's consent, PT will use one gloved finger to gently assess the muscles of the pelvic floor, seeing how well it contracts and relaxes and if there is muscle symmetry. After, the patient will get dressed and PT and patient will discuss exam findings and plan of care. PT and patient discuss plan of care, schedule, attendance policy and HEP activities.  PELVIC MMT: deferred due to time until future visit    MMT eval  Vaginal   Internal Anal Sphincter   External Anal Sphincter   Puborectalis   Diastasis Recti   (Blank rows = not tested)        TONE: Patient reports increased tone throughout right lower extremity, some spasticity present as well with walking.  PROLAPSE: N/A  TODAY'S TREATMENT:                                                                                                                              DATE:   EVAL 05/01/23: Examination completed, findings reviewed, pt educated on POC, HEP, and self care. Pt motivated to participate in PT and agreeable to attempt recommendations.   Neuro re-ed/self care: Urge suppression technique and how to perform the urge drill when urgency is high  Bladder diary log to track urinary habits, urgency, and leakage patterns  Vaginal moisturizers: samples provided (water-based)  05/28/23: Neuro re-ed/self care: Urge suppression technique and how to perform the urge drill when urgency is high  Bladder diary log to track urinary habits, urgency, and leakage patterns  Vaginal moisturizers: samples provided (water-based) Manual therapy  Internal vaginal examination to assess pelvic floor musculature Internal obturator internus soft tissue mobilization bilaterally to decrease tension at rest  Internal cueing for pelvic floor lengthening with inhalation and shortening with exhalation 2x10  Internal cueing for pelvic floor quick flick contractions 2x10   PATIENT EDUCATION:  Education details: Urge suppression technique and how to  perform the urge drill when urgency is high, Bladder diary log to track urinary habits, urgency, and leakage patterns  Person educated: Patient Education method: Explanation, Demonstration, Tactile cues, Verbal cues, and Handouts Education comprehension: verbalized understanding, returned demonstration, verbal cues required, tactile cues required, and needs further education  HOME EXERCISE PROGRAM: Access Code: [email or web address] URL: [email or web address] Date: 05/28/2023 Prepared by: Robbin Chill  Exercises - Supine Pelvic Floor Contraction  - 1 x daily - 7 x weekly - 2 sets - 10 reps - Quick Flick Pelvic Floor Contractions in Hook[place]  - 1 x daily - 7 x weekly - 2 sets - 10 reps  Patient Education - Urinary Urge Control Techniques - Urinary Urge Control Techniques  ASSESSMENT:  CLINICAL IMPRESSION: Patient is a 38 y.o. female  who was seen today for physical therapy treatment for urinary incontinence. She had a child 3 years ago and started noticing post-void dribble after having baby. In January, patient experienced an episode of numbness and tingling down her left leg. This has since  progressed to both legs and the lower half of her torso, from T7 down. She has sensation in the pelvis and is able to achieve orgasm and feel urgency to urinate. When her urgency is high, she will struggle to maintain continence. With sensation testing, patient felt all dermatomes in the lower extremities except in the L2/L3 nerve pathway on the right leg. Patient also walks with a single point cane and demonstrates moderate trendelenburg gait pattern. Internal vaginal examination performed today to asses pelvic floor musculature: patient demonstrates stiffness throughout superficial and deep pelvic floor musculature and due to this stiffness, she is unable to achieve full range of motion in the musculature. With internal cueing, she was able to actively lengthen and shorten the musculature to 75% of  active range of motion. No pain following today's examination. Patient reports thorough understanding of today's interventions and Pt would benefit from additional PT to further address deficits.    OBJECTIVE IMPAIRMENTS: decreased balance, decreased coordination, decreased endurance, decreased mobility, and difficulty walking.   ACTIVITY LIMITATIONS: carrying, lifting, bending, standing, squatting, stairs, transfers, and continence  PARTICIPATION LIMITATIONS:  work - currently on short term disability due to the fact that she works as an Pharmacist, community at a skilled nursing facility   PERSONAL FACTORS: Past/current experiences and 3+ comorbidities: Anemia, Anxiety, Back pain, Constipation, Hashimoto's disease, History of chicken pox, HSV infection, Joint pain, Lactose intolerance, Lower back pain, Palpitations, PCOS (polycystic ovarian syndrome), PCOS (polycystic ovarian syndrome), Right hip pain, SOBOE (shortness of breath on exertion), Thyroid  condition, Velamentous insertion of umbilical cord, Vitamin D  deficiency, and Vitiligo.   are also affecting patient's functional outcome.   REHAB POTENTIAL: Good  CLINICAL DECISION MAKING: Evolving/moderate complexity  EVALUATION COMPLEXITY: Moderate   GOALS: Goals reviewed with patient? Yes  SHORT TERM GOALS: Target date: 05/29/2023  Pt will be independent with HEP.  Baseline: Goal status: INITIAL  2.  Pt will be independent with the knack, urge suppression technique, and double voiding in order to improve bladder habits and decrease urinary incontinence.   Baseline:  Goal status: INITIAL  3.  Pt will be independent with diaphragmatic breathing and down training activities in order to improve pelvic floor relaxation.  Baseline:  Goal status: INITIAL  LONG TERM GOALS: Target date: 11/01/2023  Pt will be independent with advanced HEP.  Baseline:  Goal status: INITIAL  2.  Pt to demonstrate improved coordination of pelvic floor and breathing  mechanics with 10# squat with appropriate synergistic patterns to decrease leakage at least 75% of the time.   Baseline:  Goal status: INITIAL  3.  Pt will demonstrate normal pelvic floor muscle tone and A/ROM, able to achieve 4/5 strength with contractions and 10 sec endurance, in order to provide appropriate lumbopelvic support in functional activities.   Baseline:  Goal status: INITIAL  4.  Pt will decrease frequency of nightly trips to the bathroom to 1 or less in order to get restful sleep and suggest improved urinary habits and control. Baseline:  Goal status: INITIAL  PLAN:  PT FREQUENCY: 1-2x/week  PT DURATION: 12 weeks  PLANNED INTERVENTIONS: 97110-Therapeutic exercises, 97530- Therapeutic activity, 97112- Neuromuscular re-education, 97535- Self Care, [id number]- Manual therapy, Taping, Dry Needling, Joint mobilization, Spinal mobilization, Scar mobilization, Cryotherapy, and Moist heat  PLAN FOR NEXT SESSION: see how bladder diary went, perform internal pelvic floor examination, introduce pelvic floor AROM, see how urge drill went  Marni Sins, PT 05/28/2023, 1:14 PM

## 2023-06-01 ENCOUNTER — Encounter: Payer: Self-pay | Admitting: Urology

## 2023-06-04 ENCOUNTER — Ambulatory Visit: Payer: PRIVATE HEALTH INSURANCE | Admitting: Physical Therapy

## 2023-06-04 ENCOUNTER — Encounter: Payer: Self-pay | Admitting: Physical Medicine and Rehabilitation

## 2023-06-05 ENCOUNTER — Ambulatory Visit: Payer: PRIVATE HEALTH INSURANCE | Admitting: Physical Therapy

## 2023-06-10 ENCOUNTER — Ambulatory Visit: Payer: PRIVATE HEALTH INSURANCE | Attending: Student | Admitting: Physical Therapy

## 2023-06-10 DIAGNOSIS — M6281 Muscle weakness (generalized): Secondary | ICD-10-CM | POA: Diagnosis present

## 2023-06-10 DIAGNOSIS — R2689 Other abnormalities of gait and mobility: Secondary | ICD-10-CM | POA: Insufficient documentation

## 2023-06-10 DIAGNOSIS — R279 Unspecified lack of coordination: Secondary | ICD-10-CM | POA: Insufficient documentation

## 2023-06-10 DIAGNOSIS — R293 Abnormal posture: Secondary | ICD-10-CM | POA: Insufficient documentation

## 2023-06-10 NOTE — Therapy (Signed)
 OUTPATIENT PHYSICAL THERAPY FEMALE PELVIC TREATMENT   Patient Name: [name] MRN: [id number] DOB:Mar 06, 1985, 38 y.o., female Today's Date: 06/10/2023  END OF SESSION:  PT End of Session - 06/10/23 1325     Visit Number 3    Authorization Type Medicaid [employer]    PT Start Time 1230    PT Stop Time 1320    PT Time Calculation (min) 50 min    Activity Tolerance Patient tolerated treatment well    Behavior During Therapy WFL for tasks assessed/performed               Past Medical History:  Diagnosis Date   Anemia    Anxiety    Back pain    Constipation    Hashimoto's disease    History of chicken pox    HSV infection    Joint pain    Lactose intolerance    Lower back pain    Palpitations    PCOS (polycystic ovarian syndrome)    PCOS (polycystic ovarian syndrome)    Right hip pain    SOBOE (shortness of breath on exertion)    Thyroid  condition    Velamentous insertion of umbilical cord    Vitamin D  deficiency    Vitiligo    Past Surgical History:  Procedure Laterality Date   NO PAST SURGERIES     PERINEAL LACERATION REPAIR N/A 03/01/2020   Procedure: SUTURE REPAIR PERINEAL LACERATION;  Surgeon: Belle Box, MD;  Location: [facility];  Service: Gynecology;  Laterality: N/A;   Patient Active Problem List   Diagnosis Date Noted   Persistent depressive disorder 05/12/2023   Cognitive changes 05/12/2023   Fibroids 04/21/2023   Urinary incontinence 04/14/2023   Generalized edema 04/14/2023   Sensory loss 04/14/2023   Gait abnormality 03/03/2023   Paresthesia 03/03/2023   Seasonal and perennial allergic rhinitis 12/30/2022   Seasonal allergic conjunctivitis 12/30/2022   Poor sleep 07/25/2022   Weight gain 07/25/2022   Pure hypercholesterolemia 03/20/2022   Generalized obesity with starting BMI 32 03/06/2022   Low back pain 03/06/2022   Vitamin D  deficiency 03/06/2022   SOBOE (shortness of breath on exertion) 03/06/2022   Other fatigue 03/06/2022    Anxiety 07/31/2021   Gastroesophageal reflux disease without esophagitis 07/31/2021   Genital herpes simplex 07/31/2021   History of abnormal cervical Papanicolaou smear 07/31/2021   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   Subclinical hyperthyroidism 07/31/2021   Twin pregnancy 07/31/2021   Polycystic ovary syndrome 07/31/2021   Hashimoto's disease 05/21/2021   Thyroid  antibody positive 01/24/2021   Vitiligo 01/24/2021   Pregnancy 02/29/2020   Vaginal tear resulting from childbirth 02/29/2020   Hyperandrogenemia 02/04/2012   PCOS (polycystic ovarian syndrome) 02/04/2012    PCP: Arnetha Bhat, NP   REFERRING PROVIDER: Bea Lime, DO   REFERRING DIAG: R32 (ICD-10-CM) - Urinary incontinence, unspecified type  THERAPY DIAG:  Muscle weakness (generalized)  Other abnormalities of gait and mobility  Unspecified lack of coordination  Abnormal posture  Rationale for Evaluation and Treatment: Rehabilitation  ONSET DATE: January 22nd, 2025   SUBJECTIVE:  SUBJECTIVE STATEMENT: Patient reports that her pelvis has been feeling better the past few days. She is currently on her period. Her symptoms got way worse 7 days before her period. This caused a lot of stiffness in the legs and pelvic floor. She is wondering if her symptoms are hormonally related. She thinks post-void dribbling is improving. Pooping has been a little easier the past few days.   Eval: Patient reports to PT with urinary incontinence. She started experiencing pins and needles in her left leg on January 22nd of this year, and since then her numbness and tingling has gone into both lower extremities and up to thoracic spine (T7-T8). Urinary incontinence began after childbirth with post-void dribble, but now she is peeing more and leaking more,  unsure when. She feels like she doesn't fully empty her bladder.  Fluid intake: 2 40oz stanleys daily of water  PAIN:  Are you having pain? No NPRS scale: 0/10  PRECAUTIONS: None  RED FLAGS: None   WEIGHT BEARING RESTRICTIONS: No  FALLS:  Has patient fallen in last 6 months? 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   ACTIVITY LEVEL : enjoys walking for 20-30 minutes, yoga, core exercises at home   PLOF: Independent  PATIENT GOALS: to rule in/out muscular involvement, figure out why she is experiencing urinary symptoms   PERTINENT HISTORY:  Anemia, Anxiety, Back pain, Constipation, Hashimoto's disease, History of chicken pox, HSV infection, Joint pain, Lactose intolerance, Lower back pain, Palpitations, PCOS (polycystic ovarian syndrome), PCOS (polycystic ovarian syndrome), Right hip pain, SOBOE (shortness of breath on exertion), Thyroid  condition, Velamentous insertion of umbilical cord, Vitamin D  deficiency, and Vitiligo.  Sexual abuse: No  BOWEL MOVEMENT: Pain with bowel movement: No Type of bowel movement:Type (Bristol Stool Scale) 1-4, Frequency 1x/day, Strain not usually, and Splinting no Fully empty rectum: No Leakage: No Pads: No Fiber supplement/laxative Yes sometimes takes fiber supplements if constipated   URINATION: Pain with urination: No Fully empty bladder: Nohas post-void dribble Stream: Strong into weak flow  Urgency: Yes - hard to hold urgency  Frequency: every 2 hours at minimum, 1-2x/night  Leakage:  unsure when it happens, but her underwear at wet at end of day  Pads: Yes: occasionally   INTERCOURSE: currently sexually active   Ability to have vaginal penetration Yes  Pain with intercourse: Initial Penetration, During Penetration, and Deep Penetration DrynessYes  Climax: yes Marinoff Scale: 2/3  PREGNANCY: Vaginal deliveries 1 Tearing Yes: 4th degree tear   PROLAPSE: Pressure  OBJECTIVE:  Note: Objective measures  were completed at Evaluation unless otherwise noted.  DIAGNOSTIC FINDINGS:    -MRI lumbar and thoracic spine without contrast 02-27-2023, with findings of left paracentral disc perfusion/disc bulge at L5-S1 with possible left S1 nerve impingement.  Multilevel small degenerative disc bulges.               -CT renal stone study, MR cervical spine with and without contrast, and MRI brain with and without contrast 03/02/23 significant for small left incidental calcified uterine fibroid, lower cervical disc degeneration with small disc protrusions and facet hypertrophy atrophy at cervicothoracic junction; foraminal stenosis moderate left C6, mild left C7 and mild left C8    -Neurology evaluation by Dr. [name] 03-03-2023:   - EMG done 2-25, 3 limb NCS with bilateral lower extremity EMG including bilateral lumbar paraspinals, all normal   - LP: Done 1-29,.  WBC 1, total protein 27, considered normal.                           -  CT angiogram with and without contrast 03-05-2023 with findings of posterior wall enhancement suggesting fibroid; no vascular abnormalities.               -HIV, RPR, Lyme panel, drug screen, ESR, CRP, B12, ANA, heavy metal screen normal; TPO 206 from 367 02/19/23                         -- Note, elevated WBCs from 11.1-14.8 between 1-25 and 2-4; have normalized as of 2-11.  PATIENT SURVEYS:   PFIQ-7: 14  COGNITION: Overall cognitive status: Within functional limits for tasks assessed     SENSATION: Light touch: Deficits right side L2/L3   LUMBAR SPECIAL TESTS:  Single leg stance test: Positive  FUNCTIONAL TESTS:  5 times sit to stand: 20 seconds, unsteadiness   GAIT: Assistive device utilized: Single point cane Comments: mild trendelenburg   POSTURE: rounded shoulders, forward head, increased lumbar lordosis, and increased thoracic kyphosis   LUMBARAROM/PROM: within normal limits   A/PROM A/PROM  eval  Flexion   Extension   Right lateral flexion   Left lateral  flexion   Right rotation   Left rotation    (Blank rows = not tested)  LOWER EXTREMITY ROM: within normal limits   Active ROM Right eval Left eval  Hip flexion    Hip extension    Hip abduction    Hip adduction    Hip internal rotation    Hip external rotation    Knee flexion    Knee extension    Ankle dorsiflexion    Ankle plantarflexion    Ankle inversion    Ankle eversion     (Blank rows = not tested)  LOWER EXTREMITY MMT:  MMT Right eval Left eval  Hip flexion 4 5  Hip extension    Hip abduction 4 4  Hip adduction 4 4  Hip internal rotation    Hip external rotation    Knee flexion 4 4  Knee extension 5 5  Ankle dorsiflexion    Ankle plantarflexion    Ankle inversion    Ankle eversion     (Blank rows = not tested) PALPATION:   General: stiffness present with squat testing, increased muscle tension noted in bilateral glutes and lumbar paraspinals/QL   Pelvic Alignment: within normal limits   Abdominal: upper chest breathing present, abdominal bracing at rest, decreased rib excursion with inhalation                 External Perineal Exam: deferred due to time                             Internal Pelvic Floor: deferred due to time   Patient confirms identification and approves PT to assess internal pelvic floor and treatment Yes - at follow up visit  No emotional/communication barriers or cognitive limitation. Patient is motivated to learn. Patient understands and agrees with treatment goals and plan. PT explains patient will be examined in standing, sitting, and lying down to see how their muscles and joints work. When they are ready, they will be asked to remove their underwear so PT can examine their perineum. The patient is also given the option of providing their own chaperone as one is not provided in our facility. The patient also has the right and is explained the right to defer or refuse any part of the evaluation or treatment including the internal  exam. With the patient's consent, PT will use one gloved finger to gently assess the muscles of the pelvic floor, seeing how well it contracts and relaxes and if there is muscle symmetry. After, the patient will get dressed and PT and patient will discuss exam findings and plan of care. PT and patient discuss plan of care, schedule, attendance policy and HEP activities.  PELVIC MMT: deferred due to time until future visit    MMT eval  Vaginal   Internal Anal Sphincter   External Anal Sphincter   Puborectalis   Diastasis Recti   (Blank rows = not tested)        TONE: Patient reports increased tone throughout right lower extremity, some spasticity present as well with walking.  PROLAPSE: N/A  TODAY'S TREATMENT:                                                                                                                              DATE:   EVAL 05/01/23: Examination completed, findings reviewed, pt educated on POC, HEP, and self care. Pt motivated to participate in PT and agreeable to attempt recommendations.   Neuro re-ed/self care: Urge suppression technique and how to perform the urge drill when urgency is high  Bladder diary log to track urinary habits, urgency, and leakage patterns  Vaginal moisturizers: samples provided (water-based)  05/28/23: Neuro re-ed/self care: Urge suppression technique and how to perform the urge drill when urgency is high  Bladder diary log to track urinary habits, urgency, and leakage patterns  Vaginal moisturizers: samples provided (water-based) Manual therapy  Internal vaginal examination to assess pelvic floor musculature Internal obturator internus soft tissue mobilization bilaterally to decrease tension at rest  Internal cueing for pelvic floor lengthening with inhalation and shortening with exhalation 2x10  Internal cueing for pelvic floor quick flick contractions 2x10   06/10/23: Neuro re-ed/self care: Seated pelvic floor lengthening with  inhale, shortening with exhale 2x10  Seated pelvic floor quick flicks + diaphragmatic breathing 2x10  Sidelying clam + reverse clam + diaphragmatic breathing 2x10  Therapeutic exercise: Sit to stand with ball between knees + diaphragmatic breathing 2x10  Bridge + ball squeeze + diaphragmatic breathing 2x10  Supine butterfly stretch + diaphragmatic breathing 2x50min  Pretzel piriformis stretch + diaphragmatic breathing 2x55min  Manual therapy  Trigger Point Dry Needling Initial Treatment: Pt instructed on Dry Needling rational, procedures, and possible side effects. Pt instructed to expect mild to moderate muscle soreness later in the day and/or into the next day.  Pt instructed in methods to reduce muscle soreness. Pt instructed to continue prescribed HEP. Because Dry Needling was performed over or adjacent to a lung field, pt was educated on S/S of pneumothorax and to seek immediate medical attention should they occur.  Patient was educated on signs and symptoms of infection and other risk factors and advised to seek medical attention should they occur.  Patient verbalized understanding of these instructions and education.  Patient Verbal Consent Given: Yes Education Handout Provided: Yes Muscles Treated: bilateral lumbar multifidi L3/L4 Electrical Stimulation Performed: No Treatment Response/Outcome: decreased lumbar tension on right side paraspinals, improved lumbar extension   PATIENT EDUCATION:  Education details: Urge suppression technique and how to perform the urge drill when urgency is high, Bladder diary log to track urinary habits, urgency, and leakage patterns  Person educated: Patient Education method: Explanation, Demonstration, Tactile cues, Verbal cues, and Handouts Education comprehension: verbalized understanding, returned demonstration, verbal cues required, tactile cues required, and needs further education  HOME EXERCISE PROGRAM: Access Code: [email or web address] URL:  [email or web address] Date: 06/10/2023 Prepared by: Robbin Chill  Exercises - Seated Pelvic Floor Contraction  - 1 x daily - 7 x weekly - 2 sets - 10 reps - Seated Quick Flick Pelvic Floor Contractions  - 1 x daily - 7 x weekly - 2 sets - 10 reps - Supine Bridge with Mini Swiss Ball Between Knees  - 1 x daily - 7 x weekly - 2 sets - 12 reps - Sit to Stand with Celeste Cola Between Knees  - 1 x daily - 7 x weekly - 2 sets - 10 reps - Clamshell  - 1 x daily - 7 x weekly - 2 sets - 10 reps - Sidelying Reverse Clamshell  - 1 x daily - 7 x weekly - 2 sets - 10 reps - Sidelying Thoracic Rotation with Open Book  - 1 x daily - 7 x weekly - 2 sets - 10 reps - Standing Row with Anchored Resistance  - 1 x daily - 7 x weekly - 2 sets - 10 reps - Supine Butterfly Groin Stretch  - 1 x daily - 7 x weekly - 2 sets - hold  ASSESSMENT:  CLINICAL IMPRESSION: Patient is a 38 y.o. female  who was seen today for physical therapy treatment for urinary incontinence. Patient has noticed decreased pelvic pain and post-void dribble since last visit. Introduction to pelvic floor strength training went very well today and patient required minimal verbal/tactile cueing from PT. She could feel difficulty with contracting the musculature on the right side today due to increased tone. No pain following today's examination. Patient reports thorough understanding of today's interventions and Pt would benefit from additional PT to further address deficits.    OBJECTIVE IMPAIRMENTS: decreased balance, decreased coordination, decreased endurance, decreased mobility, and difficulty walking.   ACTIVITY LIMITATIONS: carrying, lifting, bending, standing, squatting, stairs, transfers, and continence  PARTICIPATION LIMITATIONS:  work - currently on short term disability due to the fact that she works as an Pharmacist, community at a skilled nursing facility   PERSONAL FACTORS: Past/current experiences and 3+ comorbidities: Anemia, Anxiety,  Back pain, Constipation, Hashimoto's disease, History of chicken pox, HSV infection, Joint pain, Lactose intolerance, Lower back pain, Palpitations, PCOS (polycystic ovarian syndrome), PCOS (polycystic ovarian syndrome), Right hip pain, SOBOE (shortness of breath on exertion), Thyroid  condition, Velamentous insertion of umbilical cord, Vitamin D  deficiency, and Vitiligo.   are also affecting patient's functional outcome.   REHAB POTENTIAL: Good  CLINICAL DECISION MAKING: Evolving/moderate complexity  EVALUATION COMPLEXITY: Moderate   GOALS: Goals reviewed with patient? Yes  SHORT TERM GOALS: Target date: 05/29/2023  Pt will be independent with HEP.  Baseline: Goal status: INITIAL  2.  Pt will be independent with the knack, urge suppression technique, and double voiding in order to improve bladder habits and decrease urinary incontinence.   Baseline:  Goal status: INITIAL  3.  Pt will be independent with diaphragmatic breathing  and down training activities in order to improve pelvic floor relaxation.  Baseline:  Goal status: INITIAL  LONG TERM GOALS: Target date: 11/01/2023  Pt will be independent with advanced HEP.  Baseline:  Goal status: INITIAL  2.  Pt to demonstrate improved coordination of pelvic floor and breathing mechanics with 10# squat with appropriate synergistic patterns to decrease leakage at least 75% of the time.   Baseline:  Goal status: INITIAL  3.  Pt will demonstrate normal pelvic floor muscle tone and A/ROM, able to achieve 4/5 strength with contractions and 10 sec endurance, in order to provide appropriate lumbopelvic support in functional activities.   Baseline:  Goal status: INITIAL  4.  Pt will decrease frequency of nightly trips to the bathroom to 1 or less in order to get restful sleep and suggest improved urinary habits and control. Baseline:  Goal status: INITIAL  PLAN:  PT FREQUENCY: 1-2x/week  PT DURATION: 12 weeks  PLANNED INTERVENTIONS:  97110-Therapeutic exercises, 97530- Therapeutic activity, 97112- Neuromuscular re-education, 97535- Self Care, 16109- Manual therapy, Taping, Dry Needling, Joint mobilization, Spinal mobilization, Scar mobilization, Cryotherapy, and Moist heat  PLAN FOR NEXT SESSION: see how bladder diary went, perform internal pelvic floor examination, introduce pelvic floor AROM, see how urge drill went  Marni Sins, PT 06/10/2023, 1:26 PM

## 2023-06-11 ENCOUNTER — Ambulatory Visit: Payer: PRIVATE HEALTH INSURANCE | Attending: Neurology | Admitting: Physical Therapy

## 2023-06-11 ENCOUNTER — Encounter: Payer: Self-pay | Admitting: Physical Therapy

## 2023-06-11 NOTE — Therapy (Signed)
 [facility] [address] [place], [place], [address] Phone: [phone]   Fax:  [phone]  Patient Details  Name: [name] MRN: [id number] Date of Birth: March 31, 1985 Referring Provider:  No ref. provider found  Encounter Date: 06/11/2023   Patient called clinic to reschedule her PT appointment, pt at end of her POC at [facility], agreeable to d/c at this time. Pt will need a new referral to return to PT in the future.   Lorita Rosa, PT Lorita Rosa, PT, DPT, CSRS  06/11/2023, 10:13 AM  [facility] [address] [place], [place], [address] Phone: [phone]   Fax:  [phone]

## 2023-06-18 ENCOUNTER — Ambulatory Visit: Payer: PRIVATE HEALTH INSURANCE | Admitting: Physical Therapy

## 2023-06-18 DIAGNOSIS — M6281 Muscle weakness (generalized): Secondary | ICD-10-CM | POA: Diagnosis not present

## 2023-06-18 DIAGNOSIS — R279 Unspecified lack of coordination: Secondary | ICD-10-CM

## 2023-06-18 DIAGNOSIS — R293 Abnormal posture: Secondary | ICD-10-CM

## 2023-06-18 NOTE — Therapy (Signed)
 OUTPATIENT PHYSICAL THERAPY FEMALE PELVIC TREATMENT   Patient Name: [name] MRN: [id number] DOB:[birth year], 38 y.o., female Today's Date: 06/18/2023  END OF SESSION:  PT End of Session - 06/18/23 1324     Visit Number 4    Authorization Type Medicaid [employer]    PT Start Time 1230    PT Stop Time 1315    PT Time Calculation (min) 45 min    Activity Tolerance Patient tolerated treatment well    Behavior During Therapy WFL for tasks assessed/performed                Past Medical History:  Diagnosis Date   Anemia    Anxiety    Back pain    Constipation    Hashimoto's disease    History of chicken pox    HSV infection    Joint pain    Lactose intolerance    Lower back pain    Palpitations    PCOS (polycystic ovarian syndrome)    PCOS (polycystic ovarian syndrome)    Right hip pain    SOBOE (shortness of breath on exertion)    Thyroid  condition    Velamentous insertion of umbilical cord    Vitamin D  deficiency    Vitiligo    Past Surgical History:  Procedure Laterality Date   NO PAST SURGERIES     PERINEAL LACERATION REPAIR N/A 03/01/2020   Procedure: SUTURE REPAIR PERINEAL LACERATION;  Surgeon: Belle Box, MD;  Location: [facility];  Service: Gynecology;  Laterality: N/A;   Patient Active Problem List   Diagnosis Date Noted   Persistent depressive disorder 05/12/2023   Cognitive changes 05/12/2023   Fibroids 04/21/2023   Urinary incontinence 04/14/2023   Generalized edema 04/14/2023   Sensory loss 04/14/2023   Gait abnormality 03/03/2023   Paresthesia 03/03/2023   Seasonal and perennial allergic rhinitis 12/30/2022   Seasonal allergic conjunctivitis 12/30/2022   Poor sleep 07/25/2022   Weight gain 07/25/2022   Pure hypercholesterolemia 03/20/2022   Generalized obesity with starting BMI 32 03/06/2022   Low back pain 03/06/2022   Vitamin D  deficiency 03/06/2022   SOBOE (shortness of breath on exertion) 03/06/2022   Other fatigue 03/06/2022    Anxiety 07/31/2021   Gastroesophageal reflux disease without esophagitis 07/31/2021   Genital herpes simplex 07/31/2021   History of abnormal cervical Papanicolaou smear 07/31/2021   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   Subclinical hyperthyroidism 07/31/2021   Twin pregnancy 07/31/2021   Polycystic ovary syndrome 07/31/2021   Hashimoto's disease 05/21/2021   Thyroid  antibody positive 01/24/2021   Vitiligo 01/24/2021   Pregnancy 02/29/2020   Vaginal tear resulting from childbirth 02/29/2020   Hyperandrogenemia 02/04/2012   PCOS (polycystic ovarian syndrome) 02/04/2012    PCP: Arnetha Bhat, NP   REFERRING PROVIDER: Bea Lime, DO   REFERRING DIAG: R32 (ICD-10-CM) - Urinary incontinence, unspecified type  THERAPY DIAG:  Muscle weakness (generalized)  Unspecified lack of coordination  Abnormal posture  Rationale for Evaluation and Treatment: Rehabilitation  ONSET DATE: January 22nd, 2025   SUBJECTIVE:  SUBJECTIVE STATEMENT: Patient reports that her urinary symptoms are improving. Her post void dribble is improving slightly. She had leakage when  getting up from lunge position in the floor. Bowel movements are improving as long as she takes her fiber. She has been using a Training and development officer. She reports that her pelvis has been feeling better with increased load throughout the day. She is seeing a new OBGYN and urodynamic testing person in June. No pelvic pain to report today, but if she is on her feet too long she will feel pelvic heaviness.   Eval: Patient reports to PT with urinary incontinence. She started experiencing pins and needles in her left leg on January 22nd of this year, and since then her numbness and tingling has gone into both lower extremities and up to thoracic spine (T7-T8). Urinary  incontinence began after childbirth with post-void dribble, but now she is peeing more and leaking more, unsure when. She feels like she doesn't fully empty her bladder.  Fluid intake: 2 40oz stanleys daily of water  PAIN:  Are you having pain? No NPRS scale: 0/10  PRECAUTIONS: None  RED FLAGS: None   WEIGHT BEARING RESTRICTIONS: No  FALLS:  Has patient fallen in last 6 months? 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   ACTIVITY LEVEL : enjoys walking for 20-30 minutes, yoga, core exercises at home   PLOF: Independent  PATIENT GOALS: to rule in/out muscular involvement, figure out why she is experiencing urinary symptoms   PERTINENT HISTORY:  Anemia, Anxiety, Back pain, Constipation, Hashimoto's disease, History of chicken pox, HSV infection, Joint pain, Lactose intolerance, Lower back pain, Palpitations, PCOS (polycystic ovarian syndrome), PCOS (polycystic ovarian syndrome), Right hip pain, SOBOE (shortness of breath on exertion), Thyroid  condition, Velamentous insertion of umbilical cord, Vitamin D  deficiency, and Vitiligo.  Sexual abuse: No  BOWEL MOVEMENT: Pain with bowel movement: No Type of bowel movement:Type (Bristol Stool Scale) 1-4, Frequency 1x/day, Strain not usually, and Splinting no Fully empty rectum: No Leakage: No Pads: No Fiber supplement/laxative Yes sometimes takes fiber supplements if constipated   URINATION: Pain with urination: No Fully empty bladder: Nohas post-void dribble Stream: Strong into weak flow  Urgency: Yes - hard to hold urgency  Frequency: every 2 hours at minimum, 1-2x/night  Leakage: unsure when it happens, but her underwear at wet at end of day  Pads: Yes: occasionally   INTERCOURSE: currently sexually active   Ability to have vaginal penetration Yes  Pain with intercourse: Initial Penetration, During Penetration, and Deep Penetration DrynessYes  Climax: yes Marinoff Scale: 2/3  PREGNANCY: Vaginal  deliveries 1 Tearing Yes: 4th degree tear   PROLAPSE: Pressure  OBJECTIVE:  Note: Objective measures were completed at Evaluation unless otherwise noted.  DIAGNOSTIC FINDINGS:    -MRI lumbar and thoracic spine without contrast 02-27-2023, with findings of left paracentral disc perfusion/disc bulge at L5-S1 with possible left S1 nerve impingement.  Multilevel small degenerative disc bulges.               -CT renal stone study, MR cervical spine with and without contrast, and MRI brain with and without contrast 03/02/23 significant for small left incidental calcified uterine fibroid, lower cervical disc degeneration with small disc protrusions and facet hypertrophy atrophy at cervicothoracic junction; foraminal stenosis moderate left C6, mild left C7 and mild left C8    -Neurology evaluation by Dr. [name] 03-03-2023:   - EMG done 2-25, 3 limb NCS with bilateral lower extremity EMG  including bilateral lumbar paraspinals, all normal   - LP: Done 1-29,.  WBC 1, total protein 27, considered normal.                           -CT angiogram with and without contrast 03-05-2023 with findings of posterior wall enhancement suggesting fibroid; no vascular abnormalities.               -HIV, RPR, Lyme panel, drug screen, ESR, CRP, B12, ANA, heavy metal screen normal; TPO 206 from 367 02/19/23                         -- Note, elevated WBCs from 11.1-14.8 between 1-25 and 2-4; have normalized as of 2-11.  PATIENT SURVEYS:   PFIQ-7: 14  COGNITION: Overall cognitive status: Within functional limits for tasks assessed     SENSATION: Light touch: Deficits right side L2/L3   LUMBAR SPECIAL TESTS:  Single leg stance test: Positive  FUNCTIONAL TESTS:  5 times sit to stand: 20 seconds, unsteadiness   GAIT: Assistive device utilized: Single point cane Comments: mild trendelenburg   POSTURE: rounded shoulders, forward head, increased lumbar lordosis, and increased thoracic kyphosis   LUMBARAROM/PROM:  within normal limits   A/PROM A/PROM  eval  Flexion   Extension   Right lateral flexion   Left lateral flexion   Right rotation   Left rotation    (Blank rows = not tested)  LOWER EXTREMITY ROM: within normal limits   Active ROM Right eval Left eval  Hip flexion    Hip extension    Hip abduction    Hip adduction    Hip internal rotation    Hip external rotation    Knee flexion    Knee extension    Ankle dorsiflexion    Ankle plantarflexion    Ankle inversion    Ankle eversion     (Blank rows = not tested)  LOWER EXTREMITY MMT:  MMT Right eval Left eval  Hip flexion 4 5  Hip extension    Hip abduction 4 4  Hip adduction 4 4  Hip internal rotation    Hip external rotation    Knee flexion 4 4  Knee extension 5 5  Ankle dorsiflexion    Ankle plantarflexion    Ankle inversion    Ankle eversion     (Blank rows = not tested) PALPATION:   General: stiffness present with squat testing, increased muscle tension noted in bilateral glutes and lumbar paraspinals/QL   Pelvic Alignment: within normal limits   Abdominal: upper chest breathing present, abdominal bracing at rest, decreased rib excursion with inhalation                 External Perineal Exam: deferred due to time                             Internal Pelvic Floor: deferred due to time   Patient confirms identification and approves PT to assess internal pelvic floor and treatment Yes - at follow up visit  No emotional/communication barriers or cognitive limitation. Patient is motivated to learn. Patient understands and agrees with treatment goals and plan. PT explains patient will be examined in standing, sitting, and lying down to see how their muscles and joints work. When they are ready, they will be asked to remove their underwear so PT can examine their  perineum. The patient is also given the option of providing their own chaperone as one is not provided in our facility. The patient also has the right and  is explained the right to defer or refuse any part of the evaluation or treatment including the internal exam. With the patient's consent, PT will use one gloved finger to gently assess the muscles of the pelvic floor, seeing how well it contracts and relaxes and if there is muscle symmetry. After, the patient will get dressed and PT and patient will discuss exam findings and plan of care. PT and patient discuss plan of care, schedule, attendance policy and HEP activities.  PELVIC MMT: deferred due to time until future visit    MMT eval  Vaginal   Internal Anal Sphincter   External Anal Sphincter   Puborectalis   Diastasis Recti   (Blank rows = not tested)        TONE: Patient reports increased tone throughout right lower extremity, some spasticity present as well with walking.  PROLAPSE: N/A  TODAY'S TREATMENT:                                                                                                                              DATE:   EVAL 05/01/23: Examination completed, findings reviewed, pt educated on POC, HEP, and self care. Pt motivated to participate in PT and agreeable to attempt recommendations.   Neuro re-ed/self care: Urge suppression technique and how to perform the urge drill when urgency is high  Bladder diary log to track urinary habits, urgency, and leakage patterns  Vaginal moisturizers: samples provided (water-based)  05/28/23: Neuro re-ed/self care: Urge suppression technique and how to perform the urge drill when urgency is high  Bladder diary log to track urinary habits, urgency, and leakage patterns  Vaginal moisturizers: samples provided (water-based) Manual therapy  Internal vaginal examination to assess pelvic floor musculature Internal obturator internus soft tissue mobilization bilaterally to decrease tension at rest  Internal cueing for pelvic floor lengthening with inhalation and shortening with exhalation 2x10  Internal cueing for pelvic floor  quick flick contractions 2x10   06/10/23: Neuro re-ed/self care: Seated pelvic floor lengthening with inhale, shortening with exhale 2x10  Seated pelvic floor quick flicks + diaphragmatic breathing 2x10  Sidelying clam + reverse clam + diaphragmatic breathing 2x10  Therapeutic exercise: Sit to stand with ball between knees + diaphragmatic breathing 2x10  Bridge + ball squeeze + diaphragmatic breathing 2x10  Supine butterfly stretch + diaphragmatic breathing 2x70min  Pretzel piriformis stretch + diaphragmatic breathing 2x34min  Manual therapy  Trigger Point Dry Needling Initial Treatment: Pt instructed on Dry Needling rational, procedures, and possible side effects. Pt instructed to expect mild to moderate muscle soreness later in the day and/or into the next day.  Pt instructed in methods to reduce muscle soreness. Pt instructed to continue prescribed HEP. Because Dry Needling was performed over or adjacent to a lung field, pt was educated  on S/S of pneumothorax and to seek immediate medical attention should they occur.  Patient was educated on signs and symptoms of infection and other risk factors and advised to seek medical attention should they occur.  Patient verbalized understanding of these instructions and education.   Patient Verbal Consent Given: Yes Education Handout Provided: Yes Muscles Treated: bilateral lumbar multifidi L3/L4 Electrical Stimulation Performed: No Treatment Response/Outcome: decreased lumbar tension on right side paraspinals, improved lumbar extension  06/18/23: Neuro re-ed/self care: standing pelvic floor contraction with inhale/exhale for full range of motion 3x10 Sumo squat holding 10# weight + diaphragmatic breathing 2x10  Reverse lunge on slider + diaphragmatic breathing 2x10  Therapeutic exercise  Adductor rocking stretch in qped + diaphragmatic breathing 2x46min each side  Qped internal rotation rocking + diaphragmatic breathing 2x59min  Hip  flexor/adductor stretch off side of bed 2x31min each side   PATIENT EDUCATION:  Education details: Urge suppression technique and how to perform the urge drill when urgency is high, Bladder diary log to track urinary habits, urgency, and leakage patterns  Person educated: Patient Education method: Explanation, Demonstration, Tactile cues, Verbal cues, and Handouts Education comprehension: verbalized understanding, returned demonstration, verbal cues required, tactile cues required, and needs further education  HOME EXERCISE PROGRAM: Access Code: [email or web address] URL: [email or web address] Date: 06/18/2023 Prepared by: Robbin Chill  Exercises - Standing Pelvic Floor Contraction  - 1 x daily - 7 x weekly - 2 sets - 10 reps - Seated Quick Flick Pelvic Floor Contractions  - 1 x daily - 7 x weekly - 2 sets - 10 reps - Sumo Squat with Dumbbell  - 1 x daily - 7 x weekly - 2 sets - 8-10 reps - Reverse Lunge on Slider  - 1 x daily - 7 x weekly - 2 sets - 5-8 reps - Quadruped Rocking Backward  - 1 x daily - 7 x weekly - 2 sets - 10 reps - Kneeling Adductor Stretch with Hip External Rotation  - 1 x daily - 7 x weekly - 2 sets - 10 reps - Hip Flexor Stretch at Edge of Bed  - 1 x daily - 7 x weekly - 2 sets - hold - Sidelying Thoracic Rotation with Open Book  - 1 x daily - 7 x weekly - 2 sets - 10 reps - Supine Butterfly Groin Stretch  - 1 x daily - 7 x weekly - 2 sets - hold  ASSESSMENT:  CLINICAL IMPRESSION: Patient is a 38 y.o. female  who was seen today for physical therapy treatment for urinary incontinence. Patient has noticed decreased pelvic pain and post-void dribble since last visit.Pelvic floor exercise training progressed to include more gravitational load as this is when she leaks most frequently. Downtraining pelvic floor stretches and mobility techniques introduced to decrease right sided adductor/groin tension at rest. No pain following today's session. Patient reports  thorough understanding of today's interventions and Pt would benefit from additional PT to further address deficits.    OBJECTIVE IMPAIRMENTS: decreased balance, decreased coordination, decreased endurance, decreased mobility, and difficulty walking.   ACTIVITY LIMITATIONS: carrying, lifting, bending, standing, squatting, stairs, transfers, and continence  PARTICIPATION LIMITATIONS: work - currently on short term disability due to the fact that she works as an Pharmacist, community at a skilled nursing facility   PERSONAL FACTORS: Past/current experiences and 3+ comorbidities: Anemia, Anxiety, Back pain, Constipation, Hashimoto's disease, History of chicken pox, HSV infection, Joint pain, Lactose intolerance, Lower back pain, Palpitations, PCOS (polycystic ovarian syndrome), PCOS (polycystic ovarian  syndrome), Right hip pain, SOBOE (shortness of breath on exertion), Thyroid  condition, Velamentous insertion of umbilical cord, Vitamin D  deficiency, and Vitiligo.  are also affecting patient's functional outcome.   REHAB POTENTIAL: Good  CLINICAL DECISION MAKING: Evolving/moderate complexity  EVALUATION COMPLEXITY: Moderate   GOALS: Goals reviewed with patient? Yes  SHORT TERM GOALS: Target date: 05/29/2023  Pt will be independent with HEP.  Baseline: Goal status: INITIAL  2.  Pt will be independent with the knack, urge suppression technique, and double voiding in order to improve bladder habits and decrease urinary incontinence.   Baseline:  Goal status: INITIAL  3.  Pt will be independent with diaphragmatic breathing and down training activities in order to improve pelvic floor relaxation.  Baseline:  Goal status: INITIAL  LONG TERM GOALS: Target date: 11/01/2023  Pt will be independent with advanced HEP.  Baseline:  Goal status: INITIAL  2.  Pt to demonstrate improved coordination of pelvic floor and breathing mechanics with 10# squat with appropriate synergistic patterns to decrease leakage at  least 75% of the time.   Baseline:  Goal status: INITIAL  3.  Pt will demonstrate normal pelvic floor muscle tone and A/ROM, able to achieve 4/5 strength with contractions and 10 sec endurance, in order to provide appropriate lumbopelvic support in functional activities.   Baseline:  Goal status: INITIAL  4.  Pt will decrease frequency of nightly trips to the bathroom to 1 or less in order to get restful sleep and suggest improved urinary habits and control. Baseline:  Goal status: INITIAL  PLAN:  PT FREQUENCY: 1-2x/week  PT DURATION: 12 weeks  PLANNED INTERVENTIONS: 97110-Therapeutic exercises, 97530- Therapeutic activity, 97112- Neuromuscular re-education, 97535- Self Care, [id number]- Manual therapy, Taping, Dry Needling, Joint mobilization, Spinal mobilization, Scar mobilization, Cryotherapy, and Moist heat  PLAN FOR NEXT SESSION: see how bladder diary went, perform internal pelvic floor examination, introduce pelvic floor AROM, see how urge drill went  Marni Sins, PT 06/18/2023, 1:25 PM

## 2023-06-25 ENCOUNTER — Ambulatory Visit: Payer: PRIVATE HEALTH INSURANCE | Admitting: Physical Therapy

## 2023-06-27 ENCOUNTER — Encounter: Payer: Self-pay | Admitting: Obstetrics and Gynecology

## 2023-07-01 ENCOUNTER — Encounter: Attending: Physical Medicine and Rehabilitation | Admitting: Psychology

## 2023-07-01 ENCOUNTER — Encounter: Payer: Self-pay | Admitting: Psychology

## 2023-07-01 DIAGNOSIS — R4189 Other symptoms and signs involving cognitive functions and awareness: Secondary | ICD-10-CM

## 2023-07-01 NOTE — Progress Notes (Signed)
 NEUROPSYCHOLOGICAL EVALUATION [place]  Physical Medicine and Rehabilitation     Patient: [name]  MRN: [id number] DOB: Dec 24, 1985  Age: 38 y.o. Sex: female  Race/Ethnicity: White or Caucasian  Years of Education: 18  Referring Provider: Emeline Joesph BROCKS, DO  Provider/Clinical Neuropsychologist: Evalene DOROTHA Riff, PsyD  Date of Service: 07/01/2023 Start Time: 8 AM End Time: 10 AM  Location of Service:  [facility] Physical Medicine & Rehabilitation Department [place]. [facility] [address], [place] [place], [place] [address] Phone: [phone]  Billing Code/Service:            96116/96121  Individuals Present: Patient was seen unaccompanied, in-person, by the provider in the provider's outpatient office. 1 hour and 15 minutes spent in face-to-face clinical interview and remaining 45 minutes was spent in record review, documentation, and testing protocol construction.    PATIENT CONSENT AND CONFIDENTIALITY The patient's understanding of the reason for referral was intact. Discussed limits of confidentiality including, but not limited to, posting of final evaluation report in the patient's electronic medical record for both the patient and for the referring provider and appropriate medical professionals. Patient was given the opportunity to have their questions answered. The neuropsychological evaluation process was discussed with the patient and they consented to proceed with the evaluation.  Consent for Evaluation and Treatment: Signed: Yes Explanation of Privacy Policies: Signed: Yes Discussion of Confidentiality Limits: Yes  REASON FOR REFERRAL:The patient was referred for neuropsychological evaluation due to concerns for cognitive changes and sensory deficits, anxiety, and possible assessment for somatization/somatic symptom disorder. Per records, the patient established care on 04/14/23 with Dr. Emeline at the PM&R clinic for  treatment of bilateral lower extremity sensory loss that started 02/26/2023 on the right and then progressed to the left side. Aggressive workup was ordered. Per records from her 05/12/23 follow-up visit with PM&R; Ongoing work with PT was recommended given improvements in functional gains and there were additional medication adjustments. Neuropsychological evaluation was also ordered.   Upon interview, the patient indicated she was pursuing the evaluation hoping to find an explanation for her symptoms. She indicated she was looking for any kind of guidance or help, whether the issue is cognitive, emotional, or due to some other cause.   HISTORY OF PRESENTING CONCERNS:  Cognitive Symptom Onset & Course: The patient indicated that she has not felt that she was at her baseline since her pregnancy three years ago (2022). She indicated there was a brief (few months) when she felt better, approximately two years after the birth of her son, but then the issues returned. She reported worsening of mental fog and fatigue, and onset of word-finding difficulty that accompanied the motor/sensory symptoms early this year. There have been several significant life events/stressors over the last several years (noted in emotional and behavioral functioning section below), but the patient did not view symptoms are directly related to those events. The patient reported discontinuing recent medications due to side effects and indicated she has been feeling clearer since doing so.   Current Cognitive Complaints: Per patient report during interview Memory: Forgetfulness (left car running in driveway), misplacing things, increased reliance on calendars.  Processing Speed: Endorsed feeling sluggish and added that she felt she is experience some depressive symptoms.   Attention & Concentration: Able to sustain concentration, but has more difficulty returning to a task if interrupted.   Language: Word-finding is improved  relative to January but not back to baseline. Denied problems with  receptive speech (if attending). No changes in writing abilities or mental arithmetic.  Visual-Spatial: She described getting her clothing snagged on things from inadvertently bumping into things (right side only).   Executive Functioning: Denied problems with decision making or problem solving. Endorsed decline in premorbid difficulties in adhering to a planned schedule, and difficulty with reliably being on time. No marked personality changes suggestive of frontal dysfunction, but behavioral changes consistent with depressive symptomology (discussed below).   Motor/Sensory Complaints: The patient described motor changes consistent with those noted by the referring provider. Currently, she stated that she has had some improvement in mobility, but I'm still limping, and my feet are constantly on fire. She indicated that she has some trouble with balance that she suspects is because I can't feel where I'm stepping. She described some issues with her vision, particularly on the right side. She does not have a field cut. No significant hearing difficulties were noted.   Emotional and Behavioral Functioning: The patient endorsed some indications of depressive symptoms at present. She described feeling emotionally kind of flat and unlike her usual self. She described some indications of slight anhedonia, but largely is still able to find enjoyment in activities. She denied problems with feelings of guilt, hopelessness, or suicidally. She reported that she felt she may have had low grade depressive symptoms starting sometime during adulthood. She has never been diagnosed or formally treated for it. IN terms of course, she indicated she felt she has struggled slightly more so with mood since her pregnancy (2022). She described challenges associated with protracted recovery due to physical health consequences from the delivery. Approximately  two year after the birth of her son, she indicated she started feeling better, but then this worsened again last year and more so within the last several months.     With respect to anxiety, the patient described herself as being a worrier in general. She endorsed having trouble relaxing, which has been long running but which began in adulthood. She reported experiencing a panic attack in the past, but none recently.   The patient experienced several notable stressors over the last few years. She experienced challenges from the physical recovery from delivery.  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 In late 2023 her mother passed from medical complications. Events surrounding her death were what most people would consider traumatic. The patient described struggling with PTSD-related symptomology in response to stimuli reminder her of events surrounding her mother, which she faced frequently in her work in [employer]. She indicated this has not occurred since she stopped working in that environment. She reported that she felt she is in a better place, psychologically, with respect to the trauma. Another notable life event involved purchasing a new home last summer. The patient described struggling with her physical health changes due to both the symptoms themselves, uncertainty around cause, and the negative impact on her life (e.g., she used to exercise regularly and this was a core coping strategy for her, but is no longer able to do so). She cited this struggle as being a primary contributor to her mental health difficulties. She reported limited success in finding new coping strategies to support mental health.   She denied any current or past suicidal ideation, homicidal ideation, paranoia, hallucination, symptoms of mania, or history of inpatient psychiatric admission.   Sleep: The patient  described difficulties  with sleep onset and returning to bed if waking up during the night. She indicated this has been going on since January. She reported getting about 5-6 hours of sleep per night on average, but typically needs 7 to feel rested.   Appetite: She described poor appetite which she links to feelings of physical discomfort. This has been present for abotu four weeks.   Caffeine: She reported a recent reduction in coffee to about 2 cups per day. She felt she had been drinking too much caffeine in recent months.   Alcohol Use: None Tobacco Use: None Recreational Substance Use: None currently or recently.    Level of Functional Independence: The patient is intact with basic and instrumental activities of daily living.   Medical History/Record Review: Per records and patient report; History of traumatic brain injury/concussion: None   History of stroke: None   History of heart attack: None   History of cancer/chemotherapy:None   History of seizure activity: None   Symptoms of chronic pain: Yes; Continual chronic pain, 7 out of 10 in severity. Distracts herself by staying busy when possible.    Experience of frequent headaches/migraines: No    Past Medical History:  Diagnosis Date   Anemia    Anxiety    Back pain    Constipation    Hashimoto's disease    History of chicken pox    HSV infection    Joint pain    Lactose intolerance    Lower back pain    Palpitations    PCOS (polycystic ovarian syndrome)    PCOS (polycystic ovarian syndrome)    Right hip pain    SOBOE (shortness of breath on exertion)    Thyroid  condition    Velamentous insertion of umbilical cord    Vitamin D  deficiency    Vitiligo    Patient Active Problem List   Diagnosis Date Noted   Persistent depressive disorder 05/12/2023   Cognitive changes 05/12/2023   Fibroids 04/21/2023   Urinary incontinence 04/14/2023   Generalized edema 04/14/2023   Sensory loss 04/14/2023   Gait abnormality  03/03/2023   Paresthesia 03/03/2023   Seasonal and perennial allergic rhinitis 12/30/2022   Seasonal allergic conjunctivitis 12/30/2022   Poor sleep 07/25/2022   Weight gain 07/25/2022   Pure hypercholesterolemia 03/20/2022   Generalized obesity with starting BMI 32 03/06/2022   Low back pain 03/06/2022   Vitamin D  deficiency 03/06/2022   SOBOE (shortness of breath on exertion) 03/06/2022   Other fatigue 03/06/2022   Anxiety 07/31/2021   Gastroesophageal reflux disease without esophagitis 07/31/2021   Genital herpes simplex 07/31/2021   History of abnormal cervical Papanicolaou smear 07/31/2021   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   Subclinical hyperthyroidism 07/31/2021   Twin pregnancy 07/31/2021   Polycystic ovary syndrome 07/31/2021   Hashimoto's disease 05/21/2021   Thyroid  antibody positive 01/24/2021   Vitiligo 01/24/2021   Pregnancy 02/29/2020   Vaginal tear resulting from childbirth 02/29/2020   Hyperandrogenemia 02/04/2012   PCOS (polycystic ovarian syndrome) 02/04/2012   Imaging/Lab Results:  03/02/2023 MRI brain with and without contrast: IMPRESSION: Normal MRI appearance of the Brain.  Family Neurologic/Medical Hx:  Family History  Problem Relation Age of Onset   Allergic rhinitis Mother    Urticaria Mother    Asthma Mother    Thyroid  disease Mother    Hypertension Mother    Cancer Mother        Cervical Cancer   Heart disease Mother    Depression Mother    Anxiety  disorder Mother    Obesity Mother    Hypertension Father    Hepatitis C Father    Allergic rhinitis Brother    Asthma Brother    Allergic rhinitis Brother    Urticaria Brother    Allergic rhinitis Brother    Thyroid  disease Maternal Aunt    Thyroid  disease Maternal Grandmother    Stroke Neg Hx    Hyperlipidemia Neg Hx    Early death Neg Hx    Medications:  cetirizine (ZYRTEC) 10 MG tablet famotidine (PEPCID) 40 MG/5ML suspension gabapentin  (NEURONTIN ) 300 MG capsule levothyroxine   (SYNTHROID ) 25 MCG tablet norethindrone  (MICRONOR ) 0.35 MG tablet valACYclovir  (VALTREX ) 500 MG tablet   Academic/Vocational History: Highest level of educational attainment: Masters, 3.2 GPA.  History of developmental delay:No History of grade repetition:No Enrollment in special education courses:No History of LD/ADHD: No indications. Employment:Not currently working.  Psychosocial: Marital Status: Committed relationship since 2018. Children/Grandchildren: Son Living Situation: With partner and son in her home.  Mental Status/Behavioral Observations: The patient was seen on an outpatient basis in the [facility] PM&R office for the clinical interview unaccompanied Sensorium/Arousal: The patient was alert.  No significant difficulties with hearing or vision. Orientation: Full. Appearance: Appropriate dress and hygiene for the setting. Behavior: Friendly, cooperative. Speech/Language: Conversational speech was prosodic, fluent, and well-articulated. Motor: Ambulated independently.  No difficulties with tremor. Social Comportment: Appropriate for the setting. Mood: Euthymic. Affect: Congruent. Thought Process/Content: Coherent, linear, goal directed. Ability to Participate in Interview: Readily answered all questions posed during the interview and provided adequate detail regarding personal history   SUMMARY / CLINICAL IMPRESSIONS The patient was referred for neuropsychological evaluation due to concerns for cognitive changes and sensory deficits, anxiety, and possible assessment for somatization/somatic symptom disorder. Per records, the patient established care on 04/14/23 with Dr. Emeline at the PM&R clinic for treatment of bilateral lower extremity sensory loss that started 02/26/2023 on the right and then progressed to the left side. Aggressive workup was ordered. Per records from her 05/12/23 follow-up visit with PM&R; Ongoing work with PT was recommended given improvements in  functional gains and there were additional medication adjustments. Neuropsychological evaluation was also ordered. Upon interview, the patient indicated she was pursuing the evaluation hoping to find an explanation for her symptoms. She indicated she was looking for any kind of guidance or help, whether the issue is cognitive, emotional, or due to some other cause.   The patient described relatively mild cognitive difficulties primarily involving short term memory, processing speed, and attention/concentration. Cognitive difficulties largely began following birth of her son, with some variability including acute changes occurring alongside physical symptoms in early 2025. The patient describes significant frustration with her physical health symptoms and with the impact that those symptoms have had on her functioning. She reported some anxiety and mood related symptoms at present. She described some long-running tendencies towards anxiety. Depressive symptoms were described in terms suggestive of sub-clinical symptom severity that began sometime in adulthood. The patient has had multiple significant life events occurring in recent years, including loss of her mother in what most would consider traumatic circumstances. She denied active PTSD symptomology at present, but described indications of trauma related response when exposed to vivid reminders of things associated with the loss of her mother when working in [place]. The patient has never been formally diagnosed or treated for any mental health difficulties. The patient described difficulties with chronic pain and also with poor sleep. She reported significant difficulties with fatigue as well.  Formal neuropsychological evaluation is indicated to potentially provide diagnostic clarity through neurocognitive testing and collection of additional data via comprehensive behavioral and psychological functioning measure.   DISPOSITION / PLAN  The patient has been  set up for a formal neuropsychological assessment to objectively assess her cognitive functioning across domains to establish the patient's cognitive profile. This data, in conjunction with information obtained via clinical interview and medical record review, will help clarify likely etiology and guide treatment recommendations. Once data collection and interpretation have been completed, the findings / diagnosis and recommendations will be reviewed and discussed with the patient during a feedback appointment with the neuropsychologist. Based on the collaborative dialogue with the patient during the feedback, recommendations may be adjusted / tailored as needed. A formal report will be produced and provided to the patient and the referring provider.   Diagnosis: Cognitive Changes  Final report to be posted with encounter date 07/18/23, following completion of testing.             Evalene DOROTHA Riff, PsyD             Neuropsychologist   This report was generated using voice recognition software. While this document has been carefully reviewed, transcription errors may be present. I apologize in advance for any inconvenience. Please contact me if further clarification is needed.

## 2023-07-02 ENCOUNTER — Ambulatory Visit: Payer: PRIVATE HEALTH INSURANCE | Admitting: Physical Therapy

## 2023-07-08 ENCOUNTER — Ambulatory Visit

## 2023-07-08 NOTE — Progress Notes (Signed)
 Subjective:    Patient ID: [name], female    DOB: 1985/12/25, 38 y.o.   MRN: [id number]  HPI  [name] is a 38 y.o. year old female  who  has a past medical history of Anemia, Anxiety, Back pain, Constipation, Hashimoto's disease, History of chicken pox, HSV infection, Joint pain, Lactose intolerance, Lower back pain, Palpitations, PCOS (polycystic ovarian syndrome), PCOS (polycystic ovarian syndrome), Right hip pain, SOBOE (shortness of breath on exertion), Thyroid  condition, Velamentous insertion of umbilical cord, Vitamin D  deficiency, and Vitiligo.   They are presenting to PM&R clinic for follow up related to bilateral lower extremity sensory loss starting February 26, 2023 on the right, then progressing to the left and superiorly to to T7-8.   Plan from last visit:  Sensory loss Gait abnormality -     Ambulatory referral to Neuropsychology   Continue physical therapy, as you are making clear improvements in functional gains.   Stop duloxetine  given 4 weeks on this medication with no benefit; start Elavil  25 mg at night for 1 week.  Then increase to 2 tablets at night for 1 week.  Then, if no side effects, increase to 3 tablets at nighttime.  Message me in 2 to 3 weeks to let me know how you are tolerating this medication.   Continue gabapentin  300 mg in the morning and 600 mg at night; I am sending a refill for this today   Follow-up with me in 2 to 3 months.     I will message you when we have the results of your MRI back    Persistent depressive disorder Cognitive changes -     Ambulatory referral to Neuropsychology   I am sending a neuropsychology referral for cognitive changes/difficulty word finding and sensory deficits, anxiety, and possible testing for somatization disorder (patient understands and is agreeable to this).     Other orders -     Amitriptyline  HCl; Take 1 tablet (25 mg total) by mouth at bedtime for 7 days, THEN 2 tablets (50 mg total) at  bedtime for 7 days, THEN 3 tablets (75 mg total) at bedtime for 16 days.  Dispense: 90 tablet; Refill: 2 -     Gabapentin ; Take 1 capsule (300 mg total) by mouth daily AND 2 capsules (600 mg total) at bedtime. Start with 300 mg at nighttime; if well tolerated at 1 week, can increase to 300 mg in the morning and 300 mg at nighttime, then in another week 300 mg in the morning and 600 mg at nighttime.  Dispense: 90 capsule; Refill: 3   Interval Hx:  - Therapies:   Pelvic PT last 5/14  She feels her mobility overall has improved; she still gets heavy feelings in her abdomen and pelvis occassionally but she is not longer needing her cane unless she is ambulating long distance.   - Follow ups:   Saw Dr. Georgeanne King 5/27 - has follow up with his tech for testing coming up; still having issues  GYN urodynamic testing in June - is establishing with a new GYN tomorrow. She says her period is unusual - she is spotting more than normal, pregnancy tests have been abnormal.   She is seeing vascular specialist soon - for redness and "hot spots" in her legs - "I've always just felt like I had issues with circulation since I had my child, and PT said I had pelvic congestion."   Endocrinology coming up - she was referred in the past for high  thyroid  Abs; Her TSH is improved, but her antibodies remain elevated.    - Falls: none   - DME: not using cane except intermittent   - Medications:  Elavil  - stopped because of fogginess and fatigue going into 4 pm the following day. She did not feel any benefit form it.  Gabapentin  - she gets benefit "it does take the edge off" her pain. She is taking 300 mg qam/600 mg qpm, tiredness has gone away.    - Other concerns: "Even when I'm sitting here, when I sit my legs get more numb and icy - it just feels like something is not flowing back up."   She is having more reactions to food like bananas and cherries, and is having issues with constipation; no diarrhea. She feels  bloated with gluten and dairy. A purple kale leaf gave her flushing and nausea as well.  - She has reached out to her allergist, who she is seeing next week.    Pain Inventory Average Pain 7 Pain Right Now 6 My pain is constant, burning, dull, and aching  In the last 24 hours, has pain interfered with the following? General activity 7 Relation with others 7 Enjoyment of life 7 What TIME of day is your pain at its worst? morning  and night Sleep (in general) Fair  Pain is worse with: bending, sitting, and standing Pain improves with: rest and therapy/exercise Relief from Meds: 5  Family History  Problem Relation Age of Onset   Allergic rhinitis Mother    Urticaria Mother    Asthma Mother    Thyroid  disease Mother    Hypertension Mother    Cancer Mother        Cervical Cancer   Heart disease Mother    Depression Mother    Anxiety disorder Mother    Obesity Mother    Hypertension Father    Hepatitis C Father    Allergic rhinitis Brother    Asthma Brother    Allergic rhinitis Brother    Urticaria Brother    Allergic rhinitis Brother    Thyroid  disease Maternal Aunt    Thyroid  disease Maternal Grandmother    Stroke Neg Hx    Hyperlipidemia Neg Hx    Early death Neg Hx    Social History   Socioeconomic History   Marital status: Significant Other    Spouse name: Avanell Bob   Number of children: Not on file   Years of education: 16+   Highest education level: Not on file  Occupational History   Occupation: Occupational therapist   Occupation: Acupuncturist  Tobacco Use   Smoking status: Never    Passive exposure: Past   Smokeless tobacco: Never  Vaping Use   Vaping status: Never Used  Substance and Sexual Activity   Alcohol use: Not Currently    Alcohol/week: 1.0 standard drink of alcohol    Types: 1 Glasses of wine per week   Drug use: No   Sexual activity: Not on file  Other Topics Concern   Not on file  Social History Narrative   Regular  exercise-yes Caffeine Use-yes   Social Drivers of Health   Financial Resource Strain: Not on file  Food Insecurity: Not on file  Transportation Needs: Not on file  Physical Activity: Not on file  Stress: Not on file  Social Connections: Unknown (06/03/2021)   Received from [facility]   Social Network    Social Network: Not on file   Past  Surgical History:  Procedure Laterality Date   NO PAST SURGERIES     PERINEAL LACERATION REPAIR N/A 03/01/2020   Procedure: SUTURE REPAIR PERINEAL LACERATION;  Surgeon: Belle Box, MD;  Location: [facility] LD ORS;  Service: Gynecology;  Laterality: N/A;   Past Surgical History:  Procedure Laterality Date   NO PAST SURGERIES     PERINEAL LACERATION REPAIR N/A 03/01/2020   Procedure: SUTURE REPAIR PERINEAL LACERATION;  Surgeon: Belle Box, MD;  Location: [facility] OR[facility];  Service: Gynecology;  Laterality: N/A;   Past Medical History:  Diagnosis Date   Anemia    Anxiety    Back pain    Constipation    Hashimoto's disease    History of chicken pox    HSV infection    Joint pain    Lactose intolerance    Lower back pain    Palpitations    PCOS (polycystic ovarian syndrome)    PCOS (polycystic ovarian syndrome)    Right hip pain    SOBOE (shortness of breath on exertion)    Thyroid  condition    Velamentous insertion of umbilical cord    Vitamin D  deficiency    Vitiligo    There were no vitals taken for this visit.  Opioid Risk Score:   Fall Risk Score:  `1  Depression screen [facility] 2/9     05/12/2023   11:12 AM 04/14/2023    2:32 PM 03/13/2023    2:31 PM 03/03/2023   10:42 AM 03/06/2022    7:39 AM 01/13/2012    1:27 PM  Depression screen PHQ 2/9  Decreased Interest 1 0 0 0 3 0  Down, Depressed, Hopeless 1 1 0 0 3 0  PHQ - 2 Score 2 1 0 0 6 0  Altered sleeping  3   2   Tired, decreased energy  3   2   Change in appetite  0   3   Feeling bad or failure about yourself   0   1   Trouble concentrating  0   2   Moving slowly or  fidgety/restless  3   0   Suicidal thoughts  0   0   PHQ-9 Score  10   16   Difficult doing work/chores  Extremely dIfficult   Somewhat difficult      Review of Systems  Musculoskeletal:  Positive for back pain.       Pain from abdomen to feet, bilateral  All other systems reviewed and are negative.     Objective:   Physical Exam    PE: Constitution: Appropriate appearance for age. No apparent distress  +Obese Resp: No respiratory distress. No accessory muscle usage. on RA Cardio: Well perfused appearance. Trace bilateral peripheral edema. Abdomen: Nondistended. Nontender.   Psych: Appropriate mood and affect. Neuro: AAOx4. No apparent cognitive deficits    Neurologic Exam:   DTRs: Reflexes were decreased in the left patella and bilateral achilles; 2+ right patella, 1 beat clonus on R ankle jerk  - improved from last exam  Babinsky: absent bilaterally Sensory exam: revealed normal sensation in all dermatomal regions until about T7-8 anterior and posteriorly; distally there is reduced sensation to light touch and pinprick     Motor exam: strength 5/5 throughout bilateral upper extremities and 5-/5 in R lower extremity HF, KE; 5/5 otherwise bilaterally   Coordination: Fine motor coordination was normal bilaterally  Gait: Wide based, decreased stance and off-shifting weight quickly onto the right side--much more stable than last  exam       Assessment & Plan:   Donis Kotowski is a 38 y.o. year old female  who  has a past medical history of Anemia, Anxiety, Back pain, Constipation, Hashimoto's disease, History of chicken pox, HSV infection, Joint pain, Lactose intolerance, Lower back pain, Palpitations, PCOS (polycystic ovarian syndrome), PCOS (polycystic ovarian syndrome), Right hip pain, SOBOE (shortness of breath on exertion), Thyroid  condition, Velamentous insertion of umbilical cord, Vitamin D  deficiency, and Vitiligo.    They are presenting to PM&R clinic for follow up  related to bilateral lower extremity sensory loss starting February 26, 2023 on the right, then progressing to the left and superiorly to to T7-8.   Sensory loss Gait abnormality Increase gabapentin  to 600 mg twice daily; reduce or stop if it increases cognitive fatigue  Continue PT and home exercises; things are definitely getting better!  Follow up in 6 months  Constipation  I provided food handout on constipation today  Fibroids Hashimoto's disease Generalized edema  We discussed trialling Whole 30 to pinpoint comon food allergens/intolerances  I agree with seeing your allergist, gyn, urology and vascular; we will allow some time to finish workups with these specialists before coming back.   Other orders -     Gabapentin ; Take 2 capsules (600 mg total) by mouth 2 (two) times daily. Start with 300 mg at nighttime; if well tolerated at 1 week, can increase to 300 mg in the morning and 300 mg at nighttime, then in another week 300 mg in the morning and 600 mg at nighttime  Dispense: 120 capsule; Refill: 3

## 2023-07-09 ENCOUNTER — Encounter: Payer: Self-pay | Attending: Physical Medicine and Rehabilitation | Admitting: Physical Medicine and Rehabilitation

## 2023-07-09 ENCOUNTER — Ambulatory Visit: Payer: PRIVATE HEALTH INSURANCE | Attending: Student | Admitting: Physical Therapy

## 2023-07-09 VITALS — BP 120/79 | HR 90 | Ht 63.0 in | Wt 194.0 lb

## 2023-07-09 DIAGNOSIS — D219 Benign neoplasm of connective and other soft tissue, unspecified: Secondary | ICD-10-CM | POA: Insufficient documentation

## 2023-07-09 DIAGNOSIS — R4189 Other symptoms and signs involving cognitive functions and awareness: Secondary | ICD-10-CM

## 2023-07-09 DIAGNOSIS — R601 Generalized edema: Secondary | ICD-10-CM | POA: Diagnosis present

## 2023-07-09 DIAGNOSIS — R279 Unspecified lack of coordination: Secondary | ICD-10-CM | POA: Insufficient documentation

## 2023-07-09 DIAGNOSIS — R200 Anesthesia of skin: Secondary | ICD-10-CM | POA: Diagnosis present

## 2023-07-09 DIAGNOSIS — R269 Unspecified abnormalities of gait and mobility: Secondary | ICD-10-CM | POA: Insufficient documentation

## 2023-07-09 DIAGNOSIS — E063 Autoimmune thyroiditis: Secondary | ICD-10-CM | POA: Diagnosis not present

## 2023-07-09 DIAGNOSIS — K5900 Constipation, unspecified: Secondary | ICD-10-CM | POA: Diagnosis present

## 2023-07-09 DIAGNOSIS — M6281 Muscle weakness (generalized): Secondary | ICD-10-CM | POA: Diagnosis present

## 2023-07-09 MED ORDER — GABAPENTIN 300 MG PO CAPS: 600.0000 mg | ORAL_CAPSULE | Freq: Two times a day (BID) | ORAL | 3 refills | Status: DC

## 2023-07-09 NOTE — Therapy (Signed)
 OUTPATIENT PHYSICAL THERAPY FEMALE PELVIC TREATMENT   Patient Name: [name] MRN: [id number] DOB:1985-02-14, 38 y.o., female Today's Date: 07/09/2023  END OF SESSION:  PT End of Session - 07/09/23 1310     Visit Number 5    Authorization Type Medicaid [employer]    PT Start Time 1230    PT Stop Time 1315    PT Time Calculation (min) 45 min    Activity Tolerance Patient tolerated treatment well    Behavior During Therapy WFL for tasks assessed/performed                 Past Medical History:  Diagnosis Date   Anemia    Anxiety    Back pain    Constipation    Hashimoto's disease    History of chicken pox    HSV infection    Joint pain    Lactose intolerance    Lower back pain    Palpitations    PCOS (polycystic ovarian syndrome)    PCOS (polycystic ovarian syndrome)    Right hip pain    SOBOE (shortness of breath on exertion)    Thyroid  condition    Velamentous insertion of umbilical cord    Vitamin D  deficiency    Vitiligo    Past Surgical History:  Procedure Laterality Date   NO PAST SURGERIES     PERINEAL LACERATION REPAIR N/A 03/01/2020   Procedure: SUTURE REPAIR PERINEAL LACERATION;  Surgeon: Belle Box, MD;  Location: [facility];  Service: Gynecology;  Laterality: N/A;   Patient Active Problem List   Diagnosis Date Noted   Persistent depressive disorder 05/12/2023   Cognitive changes 05/12/2023   Fibroids 04/21/2023   Urinary incontinence 04/14/2023   Generalized edema 04/14/2023   Sensory loss 04/14/2023   Gait abnormality 03/03/2023   Paresthesia 03/03/2023   Seasonal and perennial allergic rhinitis 12/30/2022   Seasonal allergic conjunctivitis 12/30/2022   Poor sleep 07/25/2022   Weight gain 07/25/2022   Pure hypercholesterolemia 03/20/2022   Generalized obesity with starting BMI 32 03/06/2022   Low back pain 03/06/2022   Vitamin D  deficiency 03/06/2022   SOBOE (shortness of breath on exertion) 03/06/2022   Other fatigue  03/06/2022   Anxiety 07/31/2021   Gastroesophageal reflux disease without esophagitis 07/31/2021   Genital herpes simplex 07/31/2021   History of abnormal cervical Papanicolaou smear 07/31/2021   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   Subclinical hyperthyroidism 07/31/2021   Twin pregnancy 07/31/2021   Polycystic ovary syndrome 07/31/2021   Hashimoto's disease 05/21/2021   Thyroid  antibody positive 01/24/2021   Vitiligo 01/24/2021   Pregnancy 02/29/2020   Vaginal tear resulting from childbirth 02/29/2020   Hyperandrogenemia 02/04/2012   PCOS (polycystic ovarian syndrome) 02/04/2012    PCP: Arnetha Bhat, NP   REFERRING PROVIDER: Bea Lime, DO   REFERRING DIAG: R32 (ICD-10-CM) - Urinary incontinence, unspecified type  THERAPY DIAG:  Unspecified lack of coordination  Muscle weakness (generalized)  Rationale for Evaluation and Treatment: Rehabilitation  ONSET DATE: January 22nd, 2025   SUBJECTIVE:  SUBJECTIVE STATEMENT: Patient has been sick the past two weeks. She had an allergic reaction to kale last week and she made an appt with an allergist to check on this - seeing this provider later this month. She has been tracking her period and her symptoms are worse the week before she starts her period. She has noticed when she does IR/ER of the hips she gets more tingly sensations in the legs. She is still leaking urine throughout the day. Bowel habits have been fine. She is seeing her new OBGYN tomorrow to learn more about her symptoms.   Eval: Patient reports to PT with urinary incontinence. She started experiencing pins and needles in her left leg on January 22nd of this year, and since then her numbness and tingling has gone into both lower extremities and up to thoracic spine (T7-T8). Urinary  incontinence began after childbirth with post-void dribble, but now she is peeing more and leaking more, unsure when. She feels like she doesn't fully empty her bladder.  Fluid intake: 2 40oz stanleys daily of water  PAIN:  Are you having pain? No NPRS scale: 0/10  PRECAUTIONS: None  RED FLAGS: None   WEIGHT BEARING RESTRICTIONS: No  FALLS:  Has patient fallen in last 6 months? 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   ACTIVITY LEVEL : enjoys walking for 20-30 minutes, yoga, core exercises at home   PLOF: Independent  PATIENT GOALS: to rule in/out muscular involvement, figure out why she is experiencing urinary symptoms   PERTINENT HISTORY:  Anemia, Anxiety, Back pain, Constipation, Hashimoto's disease, History of chicken pox, HSV infection, Joint pain, Lactose intolerance, Lower back pain, Palpitations, PCOS (polycystic ovarian syndrome), PCOS (polycystic ovarian syndrome), Right hip pain, SOBOE (shortness of breath on exertion), Thyroid  condition, Velamentous insertion of umbilical cord, Vitamin D  deficiency, and Vitiligo.  Sexual abuse: No  BOWEL MOVEMENT: Pain with bowel movement: No Type of bowel movement:Type (Bristol Stool Scale) 1-4, Frequency 1x/day, Strain not usually, and Splinting no Fully empty rectum: No Leakage: No Pads: No Fiber supplement/laxative Yes sometimes takes fiber supplements if constipated   URINATION: Pain with urination: No Fully empty bladder: Nohas post-void dribble Stream: Strong into weak flow  Urgency: Yes - hard to hold urgency  Frequency: every 2 hours at minimum, 1-2x/night  Leakage: unsure when it happens, but her underwear at wet at end of day  Pads: Yes: occasionally   INTERCOURSE: currently sexually active   Ability to have vaginal penetration Yes  Pain with intercourse: Initial Penetration, During Penetration, and Deep Penetration DrynessYes  Climax: yes Marinoff Scale: 2/3  PREGNANCY: Vaginal  deliveries 1 Tearing Yes: 4th degree tear   PROLAPSE: Pressure  OBJECTIVE:  Note: Objective measures were completed at Evaluation unless otherwise noted.  DIAGNOSTIC FINDINGS:    -MRI lumbar and thoracic spine without contrast 02-27-2023, with findings of left paracentral disc perfusion/disc bulge at L5-S1 with possible left S1 nerve impingement.  Multilevel small degenerative disc bulges.               -CT renal stone study, MR cervical spine with and without contrast, and MRI brain with and without contrast 03/02/23 significant for small left incidental calcified uterine fibroid, lower cervical disc degeneration with small disc protrusions and facet hypertrophy atrophy at cervicothoracic junction; foraminal stenosis moderate left C6, mild left C7 and mild left C8    -Neurology evaluation by Dr. [name] 03-03-2023:   - EMG done 2-25, 3 limb NCS  with bilateral lower extremity EMG including bilateral lumbar paraspinals, all normal   - LP: Done 1-29,.  WBC 1, total protein 27, considered normal.                           -CT angiogram with and without contrast 03-05-2023 with findings of posterior wall enhancement suggesting fibroid; no vascular abnormalities.               -HIV, RPR, Lyme panel, drug screen, ESR, CRP, B12, ANA, heavy metal screen normal; TPO 206 from 367 02/19/23                         -- Note, elevated WBCs from 11.1-14.8 between 1-25 and 2-4; have normalized as of 2-11.  PATIENT SURVEYS:   PFIQ-7 = 14 07/09/23 PFIQ-7 = 14  COGNITION: Overall cognitive status: Within functional limits for tasks assessed     SENSATION: Light touch: Deficits right side L2/L3   LUMBAR SPECIAL TESTS:  Single leg stance test: Positive  FUNCTIONAL TESTS:  5 times sit to stand: 20 seconds, unsteadiness   GAIT: Assistive device utilized: Single point cane Comments: mild trendelenburg   POSTURE: rounded shoulders, forward head, increased lumbar lordosis, and increased thoracic  kyphosis   LUMBARAROM/PROM: within normal limits   A/PROM A/PROM  eval  Flexion   Extension   Right lateral flexion   Left lateral flexion   Right rotation   Left rotation    (Blank rows = not tested)  LOWER EXTREMITY ROM: within normal limits   Active ROM Right eval Left eval  Hip flexion    Hip extension    Hip abduction    Hip adduction    Hip internal rotation    Hip external rotation    Knee flexion    Knee extension    Ankle dorsiflexion    Ankle plantarflexion    Ankle inversion    Ankle eversion     (Blank rows = not tested)  LOWER EXTREMITY MMT:  MMT Right eval Left eval  Hip flexion 4 5  Hip extension    Hip abduction 4 4  Hip adduction 4 4  Hip internal rotation    Hip external rotation    Knee flexion 4 4  Knee extension 5 5  Ankle dorsiflexion    Ankle plantarflexion    Ankle inversion    Ankle eversion     (Blank rows = not tested) PALPATION:   General: stiffness present with squat testing, increased muscle tension noted in bilateral glutes and lumbar paraspinals/QL   Pelvic Alignment: within normal limits   Abdominal: upper chest breathing present, abdominal bracing at rest, decreased rib excursion with inhalation                 External Perineal Exam: deferred due to time                             Internal Pelvic Floor: deferred due to time   Patient confirms identification and approves PT to assess internal pelvic floor and treatment Yes - at follow up visit  No emotional/communication barriers or cognitive limitation. Patient is motivated to learn. Patient understands and agrees with treatment goals and plan. PT explains patient will be examined in standing, sitting, and lying down to see how their muscles and joints work. When they are ready, they will be  asked to remove their underwear so PT can examine their perineum. The patient is also given the option of providing their own chaperone as one is not provided in our facility. The  patient also has the right and is explained the right to defer or refuse any part of the evaluation or treatment including the internal exam. With the patient's consent, PT will use one gloved finger to gently assess the muscles of the pelvic floor, seeing how well it contracts and relaxes and if there is muscle symmetry. After, the patient will get dressed and PT and patient will discuss exam findings and plan of care. PT and patient discuss plan of care, schedule, attendance policy and HEP activities.  PELVIC MMT:   MMT eval  Vaginal 3/5 , 5 quick flicks, 5 second hold  Internal Anal Sphincter   External Anal Sphincter   Puborectalis   Diastasis Recti   (Blank rows = not tested)        TONE: Within normal limits with internal examination   PROLAPSE: N/A  TODAY'S TREATMENT:                                                                                                                              DATE:   06/10/23: Neuro re-ed/self care: Seated pelvic floor lengthening with inhale, shortening with exhale 2x10  Seated pelvic floor quick flicks + diaphragmatic breathing 2x10  Sidelying clam + reverse clam + diaphragmatic breathing 2x10  Therapeutic exercise: Sit to stand with ball between knees + diaphragmatic breathing 2x10  Bridge + ball squeeze + diaphragmatic breathing 2x10  Supine butterfly stretch + diaphragmatic breathing 2x61min  Pretzel piriformis stretch + diaphragmatic breathing 2x9min  Manual therapy  Trigger Point Dry Needling Initial Treatment: Pt instructed on Dry Needling rational, procedures, and possible side effects. Pt instructed to expect mild to moderate muscle soreness later in the day and/or into the next day.  Pt instructed in methods to reduce muscle soreness. Pt instructed to continue prescribed HEP. Because Dry Needling was performed over or adjacent to a lung field, pt was educated on S/S of pneumothorax and to seek immediate medical attention should they  occur.  Patient was educated on signs and symptoms of infection and other risk factors and advised to seek medical attention should they occur.  Patient verbalized understanding of these instructions and education.   Patient Verbal Consent Given: Yes Education Handout Provided: Yes Muscles Treated: bilateral lumbar multifidi L3/L4 Electrical Stimulation Performed: No Treatment Response/Outcome: decreased lumbar tension on right side paraspinals, improved lumbar extension  06/18/23: Neuro re-ed/self care: standing pelvic floor contraction with inhale/exhale for full range of motion 3x10 Sumo squat holding 10# weight + diaphragmatic breathing 2x10  Reverse lunge on slider + diaphragmatic breathing 2x10  Therapeutic exercise  Adductor rocking stretch in qped + diaphragmatic breathing 2x56min each side  Qped internal rotation rocking + diaphragmatic breathing 2x76min  Hip flexor/adductor stretch off side of bed 2x27min each  side   07/09/23: Neuro re-ed/self care: Hooklying diaphragmatic breathing + pelvic floor lengthening with inhalation and shortening with exhalation 2x10 breaths  Manual therapy  Internal vaginal examination + treatment of pelvic floor muscles 1/2/3 Bilateral soft tissue mobilization to each levator ani and obturator internus muscle with diaphragmatic breathing interventions to promote further relaxation Lateral vaginal canal stretching + deep inhalation 2x2 breaths each side   PATIENT EDUCATION:  Education details: Urge suppression technique and how to perform the urge drill when urgency is high, Bladder diary log to track urinary habits, urgency, and leakage patterns  Person educated: Patient Education method: Explanation, Demonstration, Tactile cues, Verbal cues, and Handouts Education comprehension: verbalized understanding, returned demonstration, verbal cues required, tactile cues required, and needs further education  HOME EXERCISE PROGRAM: Access Code:  [email or web address] URL: [email or web address] Date: 06/18/2023 Prepared by: Robbin Chill  Exercises - Standing Pelvic Floor Contraction  - 1 x daily - 7 x weekly - 2 sets - 10 reps - Seated Quick Flick Pelvic Floor Contractions  - 1 x daily - 7 x weekly - 2 sets - 10 reps - Sumo Squat with Dumbbell  - 1 x daily - 7 x weekly - 2 sets - 8-10 reps - Reverse Lunge on Slider  - 1 x daily - 7 x weekly - 2 sets - 5-8 reps - Quadruped Rocking Backward  - 1 x daily - 7 x weekly - 2 sets - 10 reps - Kneeling Adductor Stretch with Hip External Rotation  - 1 x daily - 7 x weekly - 2 sets - 10 reps - Hip Flexor Stretch at Edge of Bed  - 1 x daily - 7 x weekly - 2 sets - hold - Sidelying Thoracic Rotation with Open Book  - 1 x daily - 7 x weekly - 2 sets - 10 reps - Supine Butterfly Groin Stretch  - 1 x daily - 7 x weekly - 2 sets - hold  ASSESSMENT:  CLINICAL IMPRESSION: Patient is a 38 y.o. female  who was seen today for physical therapy treatment for urinary incontinence. Patient has noticed decreased pelvic pain (0/10) and post-void dribble since last visit, but she is still leaking urine when lunging from floor and after voiding occasionally because she was sick the past two weeks and did not perform her HEP consistently. Internal vaginal examination performed and patient demonstrates a lack of coordination throughout her pelvic floor muscles. There is stiffness in the superficial and deep pelvic floor muscles but this decreases with active range of motion breath training. Manual interventions relieved pelvic tension that was present at beginning of internal exam. No pain following today's session. Patient reports thorough understanding of today's interventions and Pt would benefit from additional PT to further address deficits.    OBJECTIVE IMPAIRMENTS: decreased balance, decreased coordination, decreased endurance, decreased mobility, and difficulty walking.   ACTIVITY LIMITATIONS:  carrying, lifting, bending, standing, squatting, stairs, transfers, and continence  PARTICIPATION LIMITATIONS: work - currently on short term disability due to the fact that she works as an Pharmacist, community at a skilled nursing facility   PERSONAL FACTORS: Past/current experiences and 3+ comorbidities: Anemia, Anxiety, Back pain, Constipation, Hashimoto's disease, History of chicken pox, HSV infection, Joint pain, Lactose intolerance, Lower back pain, Palpitations, PCOS (polycystic ovarian syndrome), PCOS (polycystic ovarian syndrome), Right hip pain, SOBOE (shortness of breath on exertion), Thyroid  condition, Velamentous insertion of umbilical cord, Vitamin D  deficiency, and Vitiligo.  are also affecting patient's functional outcome.   REHAB POTENTIAL: Good  CLINICAL DECISION MAKING: Evolving/moderate complexity  EVALUATION COMPLEXITY: Moderate   GOALS: Goals reviewed with patient? Yes  SHORT TERM GOALS: Target date: 05/29/2023  Pt will be independent with HEP.  Baseline: Goal status: GOAL MET 07/09/23  2.  Pt will be independent with the knack, urge suppression technique, and double voiding in order to improve bladder habits and decrease urinary incontinence.   Baseline:  Goal status: GOAL MET 07/09/23  3.  Pt will be independent with diaphragmatic breathing and down training activities in order to improve pelvic floor relaxation.  Baseline:  Goal status: GOAL MET 07/09/23  LONG TERM GOALS: Target date: 11/01/2023  Pt will be independent with advanced HEP.  Baseline:  Goal status: ONGOING 07/09/23  2.  Pt to demonstrate improved coordination of pelvic floor and breathing mechanics with 10# squat with appropriate synergistic patterns to decrease leakage at least 75% of the time.   Baseline:  Goal status: ONGOING 07/09/23  3.  Pt will demonstrate normal pelvic floor muscle tone and A/ROM, able to achieve 4/5 strength with contractions and 10 sec endurance, in order to provide appropriate lumbopelvic  support in functional activities.   Baseline:  Goal status: ONGOING 07/09/23  4.  Pt will decrease frequency of nightly trips to the bathroom to 1 or less in order to get restful sleep and suggest improved urinary habits and control. Baseline:  Goal status: ONGOING 07/09/23  PLAN:  PT FREQUENCY: 1-2x/week  PT DURATION: 12 weeks  PLANNED INTERVENTIONS: 97110-Therapeutic exercises, 97530- Therapeutic activity, 97112- Neuromuscular re-education, 97535- Self Care, 44010- Manual therapy, Taping, Dry Needling, Joint mobilization, Spinal mobilization, Scar mobilization, Cryotherapy, and Moist heat  PLAN FOR NEXT SESSION: see how bladder diary went, perform internal pelvic floor examination, introduce pelvic floor AROM, see how urge drill went  Marni Sins, PT 07/09/2023, 1:14 PM

## 2023-07-09 NOTE — Patient Instructions (Addendum)
 Increase gabapentin  to 600 mg twice daily; reduce or stop if it increases cognitive fatigue  I provided food handout on constipation today  We discussed trialling Whole 30 to pinpoint comon food allergens/intolerances  I agree with seeing your allergist, gyn, urology and vascular; we will allow some time to finish workups with these specialists before coming back.   Continue PT and home exercises; things are definitely getting better!  Follow up in 6 months

## 2023-07-10 ENCOUNTER — Ambulatory Visit: Payer: Self-pay | Admitting: Obstetrics and Gynecology

## 2023-07-10 ENCOUNTER — Ambulatory Visit (INDEPENDENT_AMBULATORY_CARE_PROVIDER_SITE_OTHER): Payer: Self-pay | Admitting: Allergy & Immunology

## 2023-07-10 ENCOUNTER — Ambulatory Visit: Admitting: Obstetrics and Gynecology

## 2023-07-10 ENCOUNTER — Encounter: Payer: Self-pay | Admitting: Allergy & Immunology

## 2023-07-10 ENCOUNTER — Other Ambulatory Visit: Payer: Self-pay

## 2023-07-10 ENCOUNTER — Encounter: Payer: Self-pay | Admitting: Obstetrics and Gynecology

## 2023-07-10 VITALS — BP 124/82 | HR 109 | Ht 63.0 in | Wt 191.0 lb

## 2023-07-10 VITALS — BP 132/80 | HR 87 | Temp 97.9°F | Resp 18 | Ht 63.78 in | Wt 192.0 lb

## 2023-07-10 DIAGNOSIS — R102 Pelvic and perineal pain: Secondary | ICD-10-CM | POA: Diagnosis not present

## 2023-07-10 DIAGNOSIS — N946 Dysmenorrhea, unspecified: Secondary | ICD-10-CM | POA: Diagnosis not present

## 2023-07-10 DIAGNOSIS — L508 Other urticaria: Secondary | ICD-10-CM | POA: Diagnosis not present

## 2023-07-10 DIAGNOSIS — F32A Depression, unspecified: Secondary | ICD-10-CM | POA: Diagnosis not present

## 2023-07-10 DIAGNOSIS — L272 Dermatitis due to ingested food: Secondary | ICD-10-CM | POA: Diagnosis not present

## 2023-07-10 DIAGNOSIS — R5383 Other fatigue: Secondary | ICD-10-CM

## 2023-07-10 DIAGNOSIS — E049 Nontoxic goiter, unspecified: Secondary | ICD-10-CM

## 2023-07-10 DIAGNOSIS — N920 Excessive and frequent menstruation with regular cycle: Secondary | ICD-10-CM

## 2023-07-10 NOTE — Progress Notes (Signed)
 38 y.o. y.o. female here for pelvic pain, severe fatigue, edema sicne delivery of son 3 years ago. Periods heavier and more painful since then as well. Has noticed clots that are large during her cycle Fatigue affects ability to work and care for son. Is seeing other specialist as well to help determine cause. Had long labor of 19-20 hours with 4th degree tear with repair the following day. Feels something is not right since delivery Is going to pelvic PT Has had b/l lower leg tingling that sent her to the ER and has seen ortho and neurology Pregnancy and delivery was traumatic for patient and she is sure she does not want any more children She is aware of birth control as well but would like to do work-up first before adding this She would like and exam and would like cultures and blood work Last pap smear >2 years ago  Pelvic pain is worse a week before her cycle and during the cycle as well. Ua 3+ blood (on cycle now) Denies any n/v/d or blood in her stool  Patient's last menstrual period was 07/10/2023. Period Cycle (Days): 30 Period Duration (Days): 5-7 Period Pattern: Regular Menstrual Flow: Moderate Menstrual Control: Maxi pad Menstrual Control Change Freq (Hours): 6 Dysmenorrhea: (!) Moderate Dysmenorrhea Symptoms: Cramping, Nausea  CT 2019 with possible fibroids.  Referral from [facility]. No records at visit today from them.   Body mass index is 33.83 kg/m.     07/09/2023    9:50 AM 05/12/2023   11:12 AM 04/14/2023    2:32 PM  Depression screen PHQ 2/9  Decreased Interest 1 1 0  Down, Depressed, Hopeless 1 1 1   PHQ - 2 Score 2 2 1   Altered sleeping 1  3  Tired, decreased energy 1  3  Change in appetite 0  0  Feeling bad or failure about yourself  0  0  Trouble concentrating 0  0  Moving slowly or fidgety/restless 0  3  Suicidal thoughts 0  0  PHQ-9 Score 4  10  Difficult doing work/chores Very difficult  Extremely dIfficult    Blood  pressure 124/82, pulse (!) 109, height 5\' 3"  (1.6 m), weight 191 lb (86.6 kg), last menstrual period 07/10/2023, SpO2 99%, unknown if currently breastfeeding.  No results found for: "DIAGPAP", "HPVHIGH", "ADEQPAP"  GYN HISTORY: No results found for: "DIAGPAP", "HPVHIGH", "ADEQPAP"  OB History  Gravida Para Term Preterm AB Living  1 1 1  0 0 1  SAB IAB Ectopic Multiple Live Births  0 0 0 0 1    # Outcome Date GA Lbr Len/2nd Weight Sex Type Anes PTL Lv  1 Term 02/29/20 [redacted]w[redacted]d 07:32 / 01:19 7 lb 13.4 oz (3.555 kg) M Vag-Spont EPI  LIV    Past Medical History:  Diagnosis Date   Anemia    Anxiety    Back pain    Constipation    Hashimoto's disease    History of chicken pox    HSV infection    Joint pain    Lactose intolerance    Lower back pain    Palpitations    PCOS (polycystic ovarian syndrome)    PCOS (polycystic ovarian syndrome)    Right hip pain    SOBOE (shortness of breath on exertion)    Thyroid  condition    Velamentous insertion of umbilical cord    Vitamin D  deficiency    Vitiligo     Past Surgical History:  Procedure Laterality Date   NO PAST SURGERIES     PERINEAL LACERATION REPAIR N/A 03/01/2020   Procedure: SUTURE REPAIR PERINEAL LACERATION;  Surgeon: Belle Box, MD;  Location: [facility];  Service: Gynecology;  Laterality: N/A;    Current Outpatient Medications on File Prior to Visit  Medication Sig Dispense Refill   cetirizine (ZYRTEC) 10 MG tablet Take 10 mg by mouth at bedtime.     famotidine (PEPCID) 40 MG/5ML suspension Take by mouth daily.     gabapentin  (NEURONTIN ) 300 MG capsule Take 2 capsules (600 mg total) by mouth 2 (two) times daily. Start with 300 mg at nighttime; if well tolerated at 1 week, can increase to 300 mg in the morning and 300 mg at nighttime, then in another week 300 mg in the morning and 600 mg at nighttime 120 capsule 3   No current facility-administered medications on file prior to visit.    Social History   Socioeconomic  History   Marital status: Significant Other    Spouse name: Avanell Bob   Number of children: Not on file   Years of education: 16+   Highest education level: Not on file  Occupational History   Occupation: Occupational therapist   Occupation: Acupuncturist  Tobacco Use   Smoking status: Never    Passive exposure: Past   Smokeless tobacco: Never  Vaping Use   Vaping status: Never Used  Substance and Sexual Activity   Alcohol use: Not Currently    Alcohol/week: 1.0 standard drink of alcohol    Types: 1 Glasses of wine per week   Drug use: No   Sexual activity: Yes    Partners: Male    Birth control/protection: Condom  Other Topics Concern   Not on file  Social History Narrative   Regular exercise-yes Caffeine Use-yes   Social Drivers of Health   Financial Resource Strain: Not on file  Food Insecurity: Not on file  Transportation Needs: Not on file  Physical Activity: Not on file  Stress: Not on file  Social Connections: Unknown (06/03/2021)   Received from [facility]   Social Network    Social Network: Not on file  Intimate Partner Violence: Unknown (05/09/2021)   Received from [employer], [facility]   HITS    Physically Hurt: Not on file    Insult or Talk Down To: Not on file    Threaten Physical Harm: Not on file    Scream or Curse: Not on file    Family History  Problem Relation Age of Onset   Allergic rhinitis Mother    Urticaria Mother    Asthma Mother    Thyroid  disease Mother    Hypertension Mother    Cancer Mother        Cervical Cancer   Heart disease Mother    Depression Mother    Anxiety disorder Mother    Obesity Mother    Hypertension Father    Hepatitis C Father    Allergic rhinitis Brother    Asthma Brother    Allergic rhinitis Brother    Urticaria Brother    Allergic rhinitis Brother    Breast cancer Maternal Aunt 50   Thyroid  disease Maternal Aunt    Ovarian cancer Paternal Aunt 30   Thyroid  disease Maternal  Grandmother    Stroke Neg Hx    Hyperlipidemia Neg Hx    Early death Neg Hx      Allergies  Allergen Reactions   Banana Nausea  And Vomiting   Spinach Nausea And Vomiting    Stomach cramps      Patient's last menstrual period was Patient's last menstrual period was 07/10/2023.[name]             Review of Systems Alls systems reviewed and are negative.     Physical Exam Constitutional:      Appearance: Normal appearance.  Genitourinary:     Vulva normal.     No lesions in the vagina.     Genitourinary Comments: Possible fibroids vs. Right ovarian cyst. Boggy and tender contour      Right Labia: No rash, lesions or skin changes.    Left Labia: No lesions, skin changes or rash.    Vaginal cuff intact.    No vaginal discharge or tenderness.     No vaginal prolapse present.    No vaginal atrophy present.     Right Adnexa: not tender and no mass present.    Left Adnexa: not tender and no mass present.    Cervix is not absent.     Uterus is enlarged, tender and irregular.     Uterus is not absent. Breasts:    Right: Normal.     Left: Normal.  HENT:     Head: Normocephalic.  Neck:     Thyroid : No thyroid  mass, thyromegaly or thyroid  tenderness.  Cardiovascular:     Rate and Rhythm: Normal rate and regular rhythm.     Heart sounds: Normal heart sounds, S1 normal and S2 normal.  Pulmonary:     Effort: Pulmonary effort is normal.     Breath sounds: Normal breath sounds and air entry.  Abdominal:     General: There is no distension.     Palpations: Abdomen is soft. There is no mass.     Tenderness: There is no abdominal tenderness. There is no guarding or rebound.  Musculoskeletal:        General: Normal range of motion.     Cervical back: Full passive range of motion without pain, normal range of motion and neck supple. No tenderness.     Right lower leg: No edema.     Left lower leg: No edema.  Neurological:     Mental Status: She is alert.  Skin:    General: Skin  is warm.  Psychiatric:        Mood and Affect: Mood normal.        Behavior: Behavior normal.        Thought Content: Thought content normal.  Vitals and nursing note reviewed. Exam conducted with a chaperone present.       A:       pelvic pain Menorrhagia Dysmenorrhea H/o 4th degree tear with perianal repair after delivery Fibroids                              P:         Discussed possible differential diagnosis such as pelvic congestion syndrome, chronic post partum endometritis, adenomyosis, endometriosis, fibroids, vaginal infection Labs: see orders To return for annual, EMB, PUS.  Patient is aware we will need to do separate visits Nu swab sent today  No follow-ups on file.  Reinaldo Caras

## 2023-07-10 NOTE — Progress Notes (Signed)
 FOLLOW UP  Date of Service/Encounter:  07/10/23   Assessment:   Perennial and seasoning allergic rhinitis (grasses, weeds, trees, molds, dust mites, cat, dog, cockroach)  Food intolerance versus allergies - getting testing to tomato, avocado, and cherry   Complicated past medical history including new onset neuropathy  Plan/Recommendations:   Perennial and seasoning allergic rhinitis (grasses, weeds, trees, molds, dust mites, cat, dog, cockroach) - Allergen avoidance measures are listed below - Continue with antihistamine once a day as needed for runny nose or itch. Remember to rotate to a different antihistamine about every 3 months.  - Continue Flonase 1 spray in each nostril once a day as needed for stuffy nose. In the right nostril, point the applicator out toward the right ear. In the left nostril, point the applicator out toward the left ear Consider saline nasal rinses as needed for nasal symptoms. Use this before any medicated nasal sprays for best result - Consider allergen immunotherapy if your symptoms are not well-controlled with the treatment plan as listed above (written information provided for traditional and Rush therapy)  2. Food intolerances versus allergies  - We will get some labs to look for tomato, avocado, and cherry IgE levels. - I cannot test for kale, unfortunately.  - We are going to get a serum tryptase to see if this is related to a mast cell disorder.  - We can do more extensive testing for mast cell activation syndrome. - I would continue with the Pepcid at night and cetirizine in the morning.   3. Return in about 3 months (around 10/10/2023). You can have the follow up appointment with Dr. Idolina Maker or a Nurse Practicioner (our Nurse Practitioners are excellent and always have Physician oversight!).   Subjective:   [name] is a 38 y.o. female presenting today for follow up of  Chief Complaint  Patient presents with   Establish Care     Been having problems with food reactions.   Food Intolerance    [name] has a history of the following: Patient Active Problem List   Diagnosis Date Noted   Persistent depressive disorder 05/12/2023   Cognitive changes 05/12/2023   Fibroids 04/21/2023   Urinary incontinence 04/14/2023   Generalized edema 04/14/2023   Sensory loss 04/14/2023   Gait abnormality 03/03/2023   Paresthesia 03/03/2023   Seasonal and perennial allergic rhinitis 12/30/2022   Seasonal allergic conjunctivitis 12/30/2022   Poor sleep 07/25/2022   Weight gain 07/25/2022   Pure hypercholesterolemia 03/20/2022   Generalized obesity with starting BMI 32 03/06/2022   Low back pain 03/06/2022   Vitamin D  deficiency 03/06/2022   SOBOE (shortness of breath on exertion) 03/06/2022   Other fatigue 03/06/2022   Anxiety 07/31/2021   Gastroesophageal reflux disease without esophagitis 07/31/2021   Genital herpes simplex 07/31/2021   History of abnormal cervical Papanicolaou smear 07/31/2021   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  Discussed the use of AI scribe software for clinical note transcription with the patient and/or guardian, who gave verbal consent to proceed.  [name] is a 38 y.o. female presenting for a follow up visit. She was last seen in  November 2024.  She was last seen in November 2024 at which time she underwent allergy  testing.  I had originally seen her before that for establishment of care for environmental allergies.  At that visit, she underwent testing that was positive to grasses, weeds,  trees, mold, dust mite, cat, dog, and cockroach.  She also had [place] grass which was positive on intradermal testing.  She was continued on an alternating antihistamine.  Allergy  shots were discussed.  Eyedrops were recommended including Pataday  or Zaditor.  Since last visit, she has been well.  Allergic Rhinitis Symptom History: She remains on her antihistamine.  She does do a nose spray 1 spray per nostril twice daily.  This is Flonase.  She tries to use this as consistently as she can.  She does have nasal saline rinses as well.  She does not feel like she needs allergy  shots at this point.  Food Allergy  Symptom History: She experiences unusual food sensitivities, including reactions to bananas, spinach, kale, tomatoes, and avocados. After consuming kale, she experiences flushing, nausea, and headache within thirty minutes, necessitating rest for two hours. She also experiences oral pruritus after eating cherries and avoids gluten and dairy due to bloating and discomfort. She uses Flonase and over-the-counter antihistamines, specifically Allertec in the morning and Pepcid at night, which alleviate some symptoms.  She has had a sudden onset of neuropathy symptoms since January, with sensations beginning in her legs and extending to her waist, causing difficulty walking and requiring frequent rest. A comprehensive neurological workup was conducted with normal results. She had a difficult childbirth three years ago.  No fevers but long-standing joint pain for over ten years. She has been referred to rheumatology and endocrinology for further evaluation. Her ANA test was negative a few months ago.  Her family history includes her mother having hives, food sensitivities, and a hysterectomy due to cervical cancer. Her mother passed away from complications of a perforated colon.  Her symptoms have significantly impacted her daily life, causing her to be out of work. She notes symptom fluctuation with her  menstrual cycle, worsening before her period.     Otherwise, there have been no changes to her past medical history, surgical history, family history, or social history.    Review of systems otherwise negative other than that mentioned in the HPI.    Objective:   Blood pressure 132/80, pulse 87, temperature 97.9 F (36.6 C), temperature source Temporal, resp. rate 18, height 5' 3.78" (1.62 m), weight 192 lb (87.1 kg), last menstrual period 07/10/2023, SpO2 97%, unknown if currently breastfeeding. Body mass index is 33.18 kg/m.    Physical Exam Vitals reviewed.  Constitutional:      Appearance: She is well-developed.  HENT:     Head: Normocephalic and atraumatic.     Right Ear: Tympanic membrane, ear canal and external ear normal. No drainage, swelling or tenderness. Tympanic membrane is not injected, scarred, erythematous, retracted or bulging.     Left Ear: Tympanic membrane, ear canal and external ear normal. No drainage, swelling or tenderness. Tympanic membrane is not injected, scarred, erythematous, retracted or bulging.     Nose: No nasal deformity, septal deviation, mucosal edema or rhinorrhea.     Right Turbinates: Enlarged, swollen and pale.     Left Turbinates: Enlarged, swollen and pale.     Right Sinus: No maxillary sinus tenderness or frontal sinus tenderness.     Left Sinus: No maxillary sinus tenderness or frontal sinus tenderness.  Mouth/Throat:     Mouth: Mucous membranes are not pale and not dry.     Pharynx: Uvula midline.  Eyes:     General:        Right eye: No discharge.        Left eye: No discharge.     Conjunctiva/sclera: Conjunctivae normal.     Right eye: Right conjunctiva is not injected. No chemosis.    Left eye: Left conjunctiva is not injected. No chemosis.    Pupils: Pupils are equal, round, and reactive to light.  Cardiovascular:     Rate and Rhythm: Normal rate and regular rhythm.     Heart sounds: Normal heart sounds.  Pulmonary:      Effort: Pulmonary effort is normal. No tachypnea, accessory muscle usage or respiratory distress.     Breath sounds: Normal breath sounds. No wheezing, rhonchi or rales.     Comments: Moving air well in all lung fields.  No increased work of breathing. Chest:     Chest wall: No tenderness.  Abdominal:     Tenderness: There is no abdominal tenderness. There is no guarding or rebound.  Lymphadenopathy:     Head:     Right side of head: No submandibular, tonsillar or occipital adenopathy.     Left side of head: No submandibular, tonsillar or occipital adenopathy.     Cervical: No cervical adenopathy.  Skin:    Coloration: Skin is not pale.     Findings: No abrasion, erythema, petechiae or rash. Rash is not papular, urticarial or vesicular.  Neurological:     Mental Status: She is alert.  Psychiatric:        Behavior: Behavior is cooperative.      Diagnostic studies: none     Drexel Gentles, MD  [facility] 

## 2023-07-10 NOTE — Patient Instructions (Addendum)
 Perennial and seasoning allergic rhinitis (grasses, weeds, trees, molds, dust mites, cat, dog, cockroach) - Allergen avoidance measures are listed below - Continue with antihistamine once a day as needed for runny nose or itch. Remember to rotate to a different antihistamine about every 3 months.  - Continue Flonase 1 spray in each nostril once a day as needed for stuffy nose. In the right nostril, point the applicator out toward the right ear. In the left nostril, point the applicator out toward the left ear Consider saline nasal rinses as needed for nasal symptoms. Use this before any medicated nasal sprays for best result - Consider allergen immunotherapy if your symptoms are not well-controlled with the treatment plan as listed above (written information provided for traditional and Rush therapy)  2. Food intolerances versus allergies  - We will get some labs to look for tomato, avocado, and cherry IgE levels. - I cannot test for kale, unfortunately.  - We are going to get a serum tryptase to see if this is related to a mast cell disorder.  - We can do more extensive testing for mast cell activation syndrome. - I would continue with the Pepcid at night and cetirizine in the morning.   3. Return in about 3 months (around 10/10/2023). You can have the follow up appointment with Dr. Idolina Maker or a Nurse Practicioner (our Nurse Practitioners are excellent and always have Physician oversight!).    Please inform us  of any Emergency Department visits, hospitalizations, or changes in symptoms. Call us  before going to the ED for breathing or allergy  symptoms since we might be able to fit you in for a sick visit. Feel free to contact us  anytime with any questions, problems, or concerns.  It was a pleasure to see you again today!  Websites that have reliable patient information: 1. American Academy of Asthma, Allergy , and Immunology: www.aaaai.org 2. Food Allergy  Research and Education (FARE):  foodallergy.org 3. Mothers of Asthmatics: http://www.asthmacommunitynetwork.org 4. American College of Allergy , Asthma, and Immunology: www.acaai.org      "Like" us  on Facebook and Instagram for our latest updates!      A healthy democracy works best when [employer] participate! Make sure you are registered to vote! If you have moved or changed any of your contact information, you will need to get this updated before voting! Scan the QR codes below to learn more!

## 2023-07-11 LAB — SURESWAB® ADVANCED VAGINITIS PLUS,TMA
C. trachomatis RNA, TMA: NOT DETECTED
CANDIDA SPECIES: NOT DETECTED
Candida glabrata: NOT DETECTED
N. gonorrhoeae RNA, TMA: NOT DETECTED
SURESWAB(R) ADV BACTERIAL VAGINOSIS(BV),TMA: NEGATIVE
TRICHOMONAS VAGINALIS (TV),TMA: NOT DETECTED

## 2023-07-12 LAB — URINALYSIS, COMPLETE W/RFL CULTURE
Bacteria, UA: NONE SEEN /HPF
Bilirubin Urine: NEGATIVE
Glucose, UA: NEGATIVE
Hyaline Cast: NONE SEEN /LPF
Ketones, ur: NEGATIVE
Leukocyte Esterase: NEGATIVE
Nitrites, Initial: NEGATIVE
Protein, ur: NEGATIVE
Specific Gravity, Urine: 1.002 (ref 1.001–1.035)
pH: 5.5 (ref 5.0–8.0)

## 2023-07-12 LAB — URINE CULTURE
MICRO NUMBER:: 16543497
Result:: NO GROWTH
SPECIMEN QUALITY:: ADEQUATE

## 2023-07-12 LAB — CULTURE INDICATED

## 2023-07-14 ENCOUNTER — Encounter

## 2023-07-14 DIAGNOSIS — R4189 Other symptoms and signs involving cognitive functions and awareness: Secondary | ICD-10-CM

## 2023-07-14 DIAGNOSIS — R200 Anesthesia of skin: Secondary | ICD-10-CM | POA: Diagnosis not present

## 2023-07-14 NOTE — Progress Notes (Signed)
 Behavioral Observations:  The patient's hearing and vision were adequate for testing. She ambulated independently and without issue. No hand tremor was noted during testing. Her speech was prosodic, fluent, and well-articulated. She displayed no clear indications of word-finding difficulties in conversational speech and no notable paraphasic errors were noted. Receptive language appeared intact. The patient's affect was congruent with mood and her mood was largely neutral to positive. The patient was alert and participated in testing as instructed. She was cooperative throughout the session. Her pace was steady. She showed no difficulties with frustration tolerance. The patient's social interactions were unremarkable and consistent with the setting. No frank attentional lapses were appreciated.  Neuropsychology Note  [name] completed 140 minutes of neuropsychological testing with technician, Josue Ned, BA, under the supervision of Evalene Riff, PsyD., Clinical Neuropsychologist. The patient did not appear overtly distressed by the testing session, per behavioral observation or via self-report to the technician. Rest breaks were offered.   Clinical Decision Making: In considering the patient's current level of functioning, level of presumed impairment, nature of symptoms, emotional and behavioral responses during clinical interview, level of literacy, and observed level of motivation/effort, a battery of tests was selected by Dr. Riff during initial consultation on 07/01/2023. This was communicated to the technician. Communication between the neuropsychologist and technician was ongoing throughout the testing session and changes were made as deemed necessary based on patient performance on testing, technician observations and additional pertinent factors such as those listed above.  Tests Administered: Brief Visuospatial Memory Test-Revised (BVMT-R) Benton Judgment of Line Orientation  (JOLO) [place]  Verbal Learning Test-Third Edition (CVLT-3) Conners Continuous Performance Test 3rd Edition (CPT-3) Delis-Kaplan Executive Function System (D-KEFS), select subtests Grooved Pegboard Test Neuropsychological Assessment Battery (NAB), select subtest Rey Complex Figure Test (RCFT), select subtests Wechsler Adult Intelligence Scale-Fourth Edition (WAIS-IV), select subtests Wechsler Memory Scale-Fourth Edition (WMS-IV) , select subtests Wechsler Test of Adult Reading (WTAR) [place]  Card Sorting Test [phone]) The Beck Depression Inventory-II (BDI-II) Beck Anxiety Inventory (BAI) [place]  Multiphasic Personality Inventory-3 (MMPI-3) PTSD Checklist for DSM-5 (PCL-5)  Results: Note: This summary of test scores accompanies the interpretive report and should not be interpreted by unqualified individuals or in isolation without reference to the report. Test scores are relative to age, gender, and educational history as available and appropriate.   Measurement properties of test scores: IQ, Index, and Standard Scores (SS): Mean = 100; Standard Deviation = 15; Scaled Scores (ss): Mean = 10; Standard Deviation = 3; Z scores (Z): Mean = 0; Standard Deviation = 1; T scores (T); Mean = 50; Standard Deviation = 10  Intellectual/Premorbid Functioning Estimate   Norm Score Percentile  Range  Wechsler Test of Adult Reading  SS = 122 93 %ile Above Average   ATTENTION AND WORKING MEMORY    Norm Score Percentile  Range  WAIS-IV          Digit Span  ss = 10 50 %ile Average   DSF  ss = 10 50 %ile Average   Span:    7      DSB  ss = 11 63 %ile Average   Span:    6      DSS  ss = 8 25 %ile Average   Span:    5      COMPLEX ATTENTION    Norm Score Percentile  Descriptor  CPT-3          Response Style          Detectability (reverse scored)  t = 32 2 %ile    Omission Errors  t = 45 14 %ile    Commission Errors  t = 37 3 %ile    Perseverations  t = 46 38 %ile    HRT (reaction time)  t  = 66 94 %ile    HRT SD (reaction time consistency) t = 42 17 %ile    Variability (in RT consistency)  t = 40 8 %ile    HRT Block Change (over test)  t = 42 20 %ile    HRT ISI Change (by stimuli interval) t = 62 90 %ile    PROCESSING SPEED    Norm Score Percentile Range  WAIS-IV          Coding  ss = 13 84 %ile High Average   Symbol Search  ss = 12 75 %ile High Average   PSYCHOMOTION    Norm Score Percentile  Range  Grooved Pegboard - Dominant  t = 41 18 %ile Low Average   # of drops    0     Grooved Pegboard - Nondominant  t = 47 37 %ile Average   # of drops    0      LANGUAGE    Norm Score Percentile  Range  NAB Naming  t = 54 66 %ile Average   EXECUTIVE FUNCTIONING    Norm Score Percentile  Range  DKEFS - Color-Word Interference          Color Naming  ss = 7 16 %ile Low Average   Word Reading  ss = 8 25 %ile Average   Inhibition  ss = 12 75 %ile High Average   Errors  ss = 10 50 %ile Average   Inhibition Switching  ss = 10 50 %ile Average   Errors  ss = 11 63 %ile Average  DKEFS - Trails          Condition 1  ss = 5 5 %ile Below Average   Condition II  ss = 10 50 %ile Average   Condition III  ss = 12 75 %ile High Average   Condition IV  ss = 12 75 %ile High Average   Condition V  ss = 12 75 %ile High Average  [place]  Card Sorting Test (WCST)          Total Errors  t = 48 42 %ile Average   Perseverative Errors  t = 45 30 %ile Average   Non-Perseverative Errors  t = 49 47 %ile Average   % Conceptual Responses  t = 47 39 %ile Average   Categories Completed     >16 %ile WNL   Trials to First Category Complete                  >16 %ile WNL   Failure to Maintain Set     >16 %ile WNL   MEMORY    Norm Score Percentile  Range  BVMT-R          Trial 1  t = 62.0 88 %ile High Average   Trial 2  t = 65 94 %ile Above Average   Trial 3  t = 61.0 86 %ile High Average   Total Recall  t = 64.0 92 %ile Above Average   Learning  t = 46.0 32 %ile Average   Delayed Recall  t = 63.0 90  %ile Above Average   % Retained    100 >16 %ile WNL  Hits     >16 %ile WNL   False Alarms     >16 %ile WNL   Recognition Discriminability     >16 %ile WNL  CVLT-III          Trial 1  ss = 8.0 25 %ile Average   Trial 2  ss = 12.0 75 %ile High Average   Trial 3  ss = 12.0 75 %ile High Average   Trial 4  ss = 14.0 91 %ile Above Average   Trial 5  ss = 13.0 84 %ile High Average   Trial B  ss = 10.0 50 %ile Average   Short Delay Free Recall  ss = 13.0 84 %ile High Average   Short Delay Cued Recall  ss = 10.0 50 %ile Average   Long Delay Free Recall  ss = 12.0 75 %ile High Average   Long Delay Cued Recall  ss = 12.0 75 %ile High Average   Total Hits  ss = 13.0 84 %ile High Average   Total False Positives  ss = 13.0 84 %ile High Average   Recognition Discriminability  ss = 14.0 91 %ile Above Average   Total Intrusions  ss = 12.0 75 %ile High Average   Trials 1-5 Total Correct  SS = 110 75 %ile High Average   Total Repetitions  ss = 8.0 25 %ile Average   List B vs. Trial 1  ss = 11.0 63 %ile Average   SD (FR) vs. Trial 5 Correct  ss = 12.0 75 %ile High Average   LD (FR)vs. SD (FR)  ss = 10.0 50 %ile Average  Wechsler Memory Scale, 4th Edition (WMS-4)         Log. Mem. Immediate Recall  ss = 6 9 %ile Low Average   Logical Memory Delayed Recall  ss = 7 16 %ile Low Average   Logical Recognition    26th-50th %ile Average   VISUAL-SPATIAL    Norm Score Percentile  Range  Benton JOLO  ss =  56 %ile Average  Rey Complex Figure Copy        <= 1 %ile Exceptionally Low   PERSONALITY AND BEHAVIORAL FUNCTIONING      Score/Interpretation  BDI Raw       15  BDI Severity       Mild.  BAI Raw       16  BAI Severity       Moderate.   Feedback to Patient: [name] will return on 08/22/2023 for an interactive feedback session with Dr. Hayden at which time her test performances, clinical impressions and treatment recommendations will be reviewed in detail. The patient understands she can contact  our office should she require our assistance before this time.  140 minutes spent face-to-face with patient administering standardized tests, 30 minutes spent scoring Radiographer, therapeutic). [CPT [id number], [id number]]  Full report to follow.

## 2023-07-15 DIAGNOSIS — K5900 Constipation, unspecified: Secondary | ICD-10-CM | POA: Insufficient documentation

## 2023-07-15 LAB — TSH: TSH: 2.96 m[IU]/L

## 2023-07-15 LAB — RHEUMATOID FACTOR: Rheumatoid fact SerPl-aCnc: <10 [IU]/mL (ref <14)

## 2023-07-15 LAB — FOLLICLE STIMULATING HORMONE: FSH: 7.2 m[IU]/mL

## 2023-07-15 LAB — ESTROGENS, TOTAL: Estrogen: 99 pg/mL

## 2023-07-15 LAB — VITAMIN D 25 HYDROXY (VIT D DEFICIENCY, FRACTURES): Vit D, 25-Hydroxy: 34 ng/mL (ref 30–100)

## 2023-07-18 ENCOUNTER — Encounter: Admitting: Psychology

## 2023-07-18 DIAGNOSIS — R4189 Other symptoms and signs involving cognitive functions and awareness: Secondary | ICD-10-CM | POA: Diagnosis not present

## 2023-07-18 DIAGNOSIS — F419 Anxiety disorder, unspecified: Secondary | ICD-10-CM

## 2023-07-23 ENCOUNTER — Ambulatory Visit (INDEPENDENT_AMBULATORY_CARE_PROVIDER_SITE_OTHER): Admitting: Nurse Practitioner

## 2023-07-23 ENCOUNTER — Encounter: Payer: Self-pay | Admitting: Nurse Practitioner

## 2023-07-23 VITALS — BP 108/80 | HR 94 | Ht 63.78 in | Wt 195.4 lb

## 2023-07-23 DIAGNOSIS — Z8349 Family history of other endocrine, nutritional and metabolic diseases: Secondary | ICD-10-CM | POA: Diagnosis not present

## 2023-07-23 DIAGNOSIS — R768 Other specified abnormal immunological findings in serum: Secondary | ICD-10-CM | POA: Diagnosis not present

## 2023-07-23 NOTE — Patient Instructions (Signed)

## 2023-07-23 NOTE — Progress Notes (Signed)
 Endocrinology Consult Note                                         07/23/2023, 11:36 AM  Subjective:   Subjective    [name] is a 38 y.o.-year-old female patient being seen in consultation for Hashimoto's thyroiditis referred by Arnetha Bhat, NP.  She was previously seen at [facility] for same, at which time she was not started on any thyroid  hormone replacement due to euthyroid labs, was just doing surveillance labs to help determine timing of initiation.  She did not wish to go back there.   Past Medical History:  Diagnosis Date   Anemia    Anxiety    Back pain    Constipation    Hashimoto's disease    History of chicken pox    HSV infection    Joint pain    Lactose intolerance    Lower back pain    Palpitations    PCOS (polycystic ovarian syndrome)    PCOS (polycystic ovarian syndrome)    Right hip pain    SOBOE (shortness of breath on exertion)    Thyroid  condition    Velamentous insertion of umbilical cord    Vitamin D  deficiency    Vitiligo     Past Surgical History:  Procedure Laterality Date   NO PAST SURGERIES     PERINEAL LACERATION REPAIR N/A 03/01/2020   Procedure: SUTURE REPAIR PERINEAL LACERATION;  Surgeon: Belle Box, MD;  Location: [facility] ORS;  Service: Gynecology;  Laterality: N/A;    Social History   Socioeconomic History   Marital status: Significant Other    Spouse name: [name]   Number of children: Not on file   Years of education: 16+   Highest education level: Not on file  Occupational History   Occupation: Occupational therapist   Occupation: Acupuncturist  Tobacco Use   Smoking status: Never    Passive exposure: Past   Smokeless tobacco: Never  Vaping Use   Vaping status: Never Used  Substance and Sexual Activity   Alcohol use: Not Currently    Alcohol/week: 1.0 standard drink of alcohol    Types: 1 Glasses of wine per week   Drug use: No    Sexual activity: Yes    Partners: Male    Birth control/protection: Condom  Other Topics Concern   Not on file  Social History Narrative   Regular exercise-yes Caffeine Use-yes   Social Drivers of Health   Financial Resource Strain: Not on file  Food Insecurity: Not on file  Transportation Needs: Not on file  Physical Activity: Not on file  Stress: Not on file  Social Connections: Unknown (06/03/2021)   Received from [facility]   Social Network    Social Network: Not on file    Family History  Problem Relation Age of Onset   Allergic rhinitis Mother    Urticaria Mother    Asthma Mother    Thyroid  disease Mother    Hypertension  Mother    Cancer Mother        Cervical Cancer   Heart disease Mother    Depression Mother    Anxiety disorder Mother    Obesity Mother    Hypertension Father    Hepatitis C Father    Allergic rhinitis Brother    Asthma Brother    Allergic rhinitis Brother    Urticaria Brother    Allergic rhinitis Brother    Breast cancer Maternal Aunt 50   Thyroid  disease Maternal Aunt    Ovarian cancer Paternal Aunt 50   Thyroid  disease Maternal Grandmother    Stroke Neg Hx    Hyperlipidemia Neg Hx    Early death Neg Hx     Outpatient Encounter Medications as of 07/23/2023  Medication Sig   cetirizine (ZYRTEC) 10 MG tablet Take 10 mg by mouth at bedtime.   famotidine (PEPCID) 40 MG/5ML suspension Take by mouth daily.   gabapentin  (NEURONTIN ) 300 MG capsule Take 2 capsules (600 mg total) by mouth 2 (two) times daily. Start with 300 mg at nighttime; if well tolerated at 1 week, can increase to 300 mg in the morning and 300 mg at nighttime, then in another week 300 mg in the morning and 600 mg at nighttime   No facility-administered encounter medications on file as of 07/23/2023.    ALLERGIES: Allergies  Allergen Reactions   Banana Nausea And Vomiting   Spinach Nausea And Vomiting    Stomach cramps   VACCINATION STATUS: Immunization History   Administered Date(s) Administered   Influenza-Unspecified 12/10/2019   Moderna Covid-19 Vaccine Bivalent Booster 59yrs & up 11/29/2019   Moderna Sars-Covid-2 Vaccination 03/09/2019, 04/06/2019   Tdap 12/06/2019     HPI   [name]  is a patient with the above medical history. she was diagnosed with Hashimoto's at approximate age of 35 years after the birth of her child.  She has had several checks on TPO antibodies, all of which were high, indicating autoimmune thyroid  dysfunction.  Thus far, she has only been monitored, no thyroid  hormone replacement therapy has been initiated due to euthyroid labs.  I reviewed patient's thyroid  tests:  Lab Results  Component Value Date   TSH 2.96 07/10/2023   TSH 2.01 02/19/2023   TSH 2.600 06/18/2022   TSH 3.070 03/06/2022   TSH 2.47 05/21/2021   TSH 1.642 02/04/2012   TSH 1.32 01/13/2012   FREET4 0.9 02/19/2023   FREET4 0.69 05/21/2021     Pt describes: - weight gain - fatigue - temperature fluctuations - depression - constipation - dry skin- vitiligo - joint aches  Pt denies feeling nodules in neck, hoarseness, dysphagia/odynophagia, SOB with lying down.  she denies any known family history of thyroid  disorders.  No family history of thyroid  cancer.  No history of radiation therapy to head or neck.  No recent use of iodine supplements.  Denies use of Biotin containing supplements- was taking one up until about 2-3 months ago.  I reviewed her chart and she also has a history of GERD, PCOS, scalp psoriasis, uterine fibroids, HLD, vitamin d  deficiency, peripheral venous insufficiency.   ROS:  Constitutional: + weight gain, + fatigue, + temperature fluctuations Eyes: no blurry vision, no xerophthalmia ENT: no sore throat, no nodules palpated in throat, no dysphagia/odynophagia, no hoarseness Cardiovascular: no chest pain, no SOB, no palpitations Respiratory: no cough, no SOB Gastrointestinal: no nausea/vomiting/diarrhea, +  constipation Musculoskeletal: + diffuse muscle/joint aches- worse in BLE Skin: no rashes Neurological: no tremors,  no numbness, no tingling, no dizziness Psychiatric: + depression, no anxiety   Objective:   Objective     BP 108/80 (BP Location: Right Arm, Patient Position: Sitting, Cuff Size: Large)   Pulse 94   Ht 5' 3.78 (1.62 m)   Wt 195 lb 6.4 oz (88.6 kg)   LMP 07/10/2023   BMI 33.77 kg/m  Wt Readings from Last 3 Encounters:  07/23/23 195 lb 6.4 oz (88.6 kg)  07/10/23 192 lb (87.1 kg)  07/10/23 191 lb (86.6 kg)    BP Readings from Last 3 Encounters:  07/23/23 108/80  07/10/23 132/80  07/10/23 124/82     Constitutional:  Body mass index is 33.77 kg/m., not in acute distress, normal state of mind Eyes: PERRLA, EOMI, no exophthalmos ENT: moist mucous membranes, + mild thyromegaly, without palpable nodularity, no cervical lymphadenopathy Cardiovascular: normal precordial activity, RRR, no murmur/rubs/gallops Respiratory:  adequate breathing efforts, no gross chest deformity, Clear to auscultation bilaterally Gastrointestinal: abdomen soft, non-tender, no distension, bowel sounds present Musculoskeletal: no gross deformities, strength intact in all four extremities Skin: moist, warm, no rashes Neurological: no tremor with outstretched hands, deep tendon reflexes normal in BLE.   CMP ( most recent) CMP     Component Value Date/Time   NA 140 03/11/2023 1545   K 3.7 03/11/2023 1545   CL 101 03/11/2023 1545   CO2 24 03/11/2023 1545   GLUCOSE 79 03/11/2023 1545   GLUCOSE 81 03/05/2023 1217   BUN 12 03/11/2023 1545   CREATININE 0.78 03/11/2023 1545   CALCIUM  9.5 03/11/2023 1545   PROT 7.0 03/11/2023 1545   ALBUMIN 4.4 03/11/2023 1545   AST 14 03/11/2023 1545   ALT 12 03/11/2023 1545   ALKPHOS 49 03/11/2023 1545   BILITOT 0.2 03/11/2023 1545   GFR 66.35 01/13/2012 1340   EGFR 100 03/11/2023 1545   GFRNONAA >60 03/05/2023 1217     Diabetic Labs (most  recent): Lab Results  Component Value Date   HGBA1C 5.2 03/06/2022   HGBA1C 5.2 02/04/2012     Lipid Panel ( most recent) Lipid Panel     Component Value Date/Time   CHOL 214 (H) 02/19/2023 0856   CHOL 242 (H) 06/18/2022 1251   TRIG 92 02/19/2023 0856   HDL 55 02/19/2023 0856   HDL 60 06/18/2022 1251   CHOLHDL 3.9 02/19/2023 0856   VLDL 5.8 01/13/2012 1340   LDLCALC 139 (H) 02/19/2023 0856   LABVLDL 11 06/18/2022 1251        Assessment & Plan:   ASSESSMENT / PLAN:  1. Hashimoto's thyroiditis  On physical exam, patient does not have gross goiter, thyroid  nodules, or neck compression symptoms.  Will recheck more comprehensive thyroid  panel.  If labs suggestive of hypothyroidism, or subclinical hypothyroidism, may initiate low dose Levothyroxine to help with her symptoms (consistent with hypothyroidism).  Will also check baseline thyroid  ultrasound to assess anatomy as well.  - We discussed about correct intake of levothyroxine, at fasting, with water, separated by at least 30 minutes from breakfast, and separated by more than 4 hours from calcium , iron, multivitamins, acid reflux medications (PPIs). -Patient is made aware of the fact that thyroid  hormone replacement is needed for life, dose to be adjusted by periodic monitoring of thyroid  function tests.   - Time spent with the patient: 56 minutes,which was spent in obtaining information about her symptoms, reviewing her previous labs, evaluations, and treatments, counseling her about her hypothyroidism, and developing a plan to confirm the diagnosis and  long term treatment as necessary. Please refer to Patient Self Inventory in the Media tab for reviewed elements of pertinent patient history.  Judith Novak participated in the discussions, expressed understanding, and voiced agreement with the above plans.  All questions were answered to her satisfaction. she is encouraged to contact clinic should she have any questions or  concerns prior to her return visit.   FOLLOW UP PLAN:  Return will call with thyroid  labs and ultrasound results.Hulon Magic, FNP-BC [facility] [address] [place], [place] [address] Phone: [phone] Fax: [phone]  07/23/2023, 11:36 AM

## 2023-07-24 ENCOUNTER — Ambulatory Visit: Payer: Self-pay | Admitting: Nurse Practitioner

## 2023-07-24 DIAGNOSIS — E063 Autoimmune thyroiditis: Secondary | ICD-10-CM

## 2023-07-24 LAB — C-REACTIVE PROTEIN: CRP: <1 mg/L (ref 0–10)

## 2023-07-24 LAB — SEDIMENTATION RATE: Sed Rate: 12 mm/h (ref 0–32)

## 2023-07-24 LAB — T4, FREE: Free T4: 0.79 ng/dL — ABNORMAL LOW (ref 0.82–1.77)

## 2023-07-24 LAB — ALLERGEN, WHEAT, F4: Wheat IgE: <0.1 kU/L

## 2023-07-24 LAB — TSH: TSH: 3.16 u[IU]/mL (ref 0.450–4.500)

## 2023-07-24 LAB — T3, FREE: T3, Free: 2.9 pg/mL (ref 2.0–4.4)

## 2023-07-24 LAB — ALPHA-GAL PANEL
Allergen Lamb IgE: <0.1 kU/L
Beef IgE: <0.1 kU/L
IgE (Immunoglobulin E), Serum: 52 [IU]/mL (ref 6–495)
O215-IgE Alpha-Gal: <0.1 kU/L
Pork IgE: <0.1 kU/L

## 2023-07-24 LAB — FANA STAINING PATTERNS: Speckled Pattern: 1:80 {titer}

## 2023-07-24 LAB — ANTINUCLEAR ANTIBODIES, IFA: ANA Titer 1: POSITIVE — AB

## 2023-07-24 LAB — ALLERGEN AVOCADO F96: F096-IgE Avocado: <0.1 kU/L

## 2023-07-24 LAB — ALLERGEN BARLEY F6: Allergen Barley IgE: <0.1 kU/L

## 2023-07-24 LAB — ALLERGEN, CHERRY, F242: F242-IgE Bing Cherry: 0.34 kU/L — AB

## 2023-07-24 LAB — ALLERGEN MILK: Milk IgE: <0.1 kU/L

## 2023-07-24 LAB — ALLERGEN, RYE, F5: Rye IgE: <0.1 kU/L

## 2023-07-24 LAB — ALLERGEN, TOMATO F25: Allergen Tomato, IgE: <0.1 kU/L

## 2023-07-24 LAB — TRYPTASE: Tryptase: 5.5 ug/L (ref 2.2–13.2)

## 2023-07-24 MED ORDER — LEVOTHYROXINE SODIUM 25 MCG PO TABS: 25.0000 ug | ORAL_TABLET | Freq: Every day | ORAL | 3 refills | Status: AC

## 2023-07-24 NOTE — Progress Notes (Signed)
 FYI I sent mychart message going over recent thyroid  results.

## 2023-07-25 ENCOUNTER — Ambulatory Visit: Admitting: Obstetrics and Gynecology

## 2023-07-25 ENCOUNTER — Encounter: Payer: Self-pay | Admitting: Obstetrics and Gynecology

## 2023-07-25 VITALS — BP 118/84 | HR 89

## 2023-07-25 DIAGNOSIS — A6000 Herpesviral infection of urogenital system, unspecified: Secondary | ICD-10-CM | POA: Diagnosis not present

## 2023-07-25 DIAGNOSIS — Z23 Encounter for immunization: Secondary | ICD-10-CM | POA: Diagnosis not present

## 2023-07-25 MED ORDER — VALACYCLOVIR HCL 500 MG PO TABS: 500.0000 mg | ORAL_TABLET | Freq: Two times a day (BID) | ORAL | 3 refills | Status: DC

## 2023-07-25 MED ORDER — VALACYCLOVIR HCL 1 G PO TABS: 1000.0000 mg | ORAL_TABLET | Freq: Two times a day (BID) | ORAL | 0 refills | Status: AC

## 2023-07-25 NOTE — Addendum Note (Signed)
 Addended by: Zanna Hawn E on: 07/25/2023 12:30 PM   Modules accepted: Orders

## 2023-07-25 NOTE — Progress Notes (Signed)
   Acute Office Visit  Subjective:    Patient ID: [name], female    DOB: 09-28-1985, 38 y.o.   MRN: [id number]   HPI 38 y.o. presents today for Office Visit (HSV outbreak- having some pain and sharp sensation in vaginal area. Noticed about a week ago didn't realize what it was at first) .  Patient's last menstrual period was 07/10/2023 (exact date). Period Cycle (Days): 28 Period Duration (Days): 7 Period Pattern: Regular Menstrual Flow: Moderate (varies) Menstrual Control: Maxi pad Dysmenorrhea: (!) Moderate Dysmenorrhea Symptoms: Cramping  Review of Systems    Lesions resolved just having the end phase of tingling going up Last outbreak 17 years ago Working with vascular on pelvic congestion syndrome As her leg veins show valvular reflux Objective:    OBGyn Exam  BP 118/84 (BP Location: Left Arm, Patient Position: Sitting)   Pulse 89   LMP 07/10/2023 (Exact Date)   SpO2 98%  Wt Readings from Last 3 Encounters:  07/23/23 195 lb 6.4 oz (88.6 kg)  07/10/23 192 lb (87.1 kg)  07/10/23 191 lb (86.6 kg)         Assessment & Plan:  Genital HSV 1.,  treatment and suppression dosing sent to her pharmacy with two separate prescriptions. Can beign the 1 gram twice daily now for the 10 days and resume with 500bid for suppression 2. Counseled on gardesil vaccination and she denies having them and would like to receive her first shot today.  R/b/a/I were reviewed.  15 minutes spent on reviewing records, imaging,  and one on one patient time and counseling patient and documentation Dr. Caro Christmas

## 2023-07-30 ENCOUNTER — Ambulatory Visit: Payer: Self-pay | Admitting: Allergy & Immunology

## 2023-08-04 ENCOUNTER — Ambulatory Visit (HOSPITAL_COMMUNITY)
Admission: RE | Admit: 2023-08-04 | Discharge: 2023-08-04 | Disposition: A | Discharge status: Home / Self Care | Source: Ambulatory Visit | Attending: Nurse Practitioner | Admitting: Nurse Practitioner

## 2023-08-04 DIAGNOSIS — R768 Other specified abnormal immunological findings in serum: Secondary | ICD-10-CM | POA: Insufficient documentation

## 2023-08-04 NOTE — Progress Notes (Signed)
 Noted a noted has been sent in MyChart to the patient.

## 2023-08-04 NOTE — Progress Notes (Signed)
 FYI I sent mychart msg about her thyroid  ultrasound results.

## 2023-08-06 ENCOUNTER — Ambulatory Visit: Payer: Self-pay | Attending: Student | Admitting: Physical Therapy

## 2023-08-06 DIAGNOSIS — R279 Unspecified lack of coordination: Secondary | ICD-10-CM | POA: Insufficient documentation

## 2023-08-06 NOTE — Therapy (Signed)
 OUTPATIENT PHYSICAL THERAPY FEMALE PELVIC DISCHARGE SUMMARY   Patient Name: [name] MRN: [id number] DOB:1985/08/30, 38 y.o., female Today's Date: 08/06/2023  END OF SESSION:  PT End of Session - 08/06/23 1211     Visit Number 6    Authorization Type Medicaid [employer]    PT Start Time 1145    PT Stop Time 1215    PT Time Calculation (min) 30 min    Activity Tolerance Patient tolerated treatment well    Behavior During Therapy WFL for tasks assessed/performed               Past Medical History:  Diagnosis Date   Anemia    Anxiety    Back pain    Constipation    Hashimoto's disease    History of chicken pox    HSV infection    Joint pain    Lactose intolerance    Lower back pain    Palpitations    PCOS (polycystic ovarian syndrome)    PCOS (polycystic ovarian syndrome)    Right hip pain    SOBOE (shortness of breath on exertion)    Thyroid  condition    Velamentous insertion of umbilical cord    Vitamin D  deficiency    Vitiligo    Past Surgical History:  Procedure Laterality Date   NO PAST SURGERIES     PERINEAL LACERATION REPAIR N/A 03/01/2020   Procedure: SUTURE REPAIR PERINEAL LACERATION;  Surgeon: Marget Lenis, MD;  Location: [facility];  Service: Gynecology;  Laterality: N/A;   Patient Active Problem List   Diagnosis Date Noted   Constipation 07/15/2023   Persistent depressive disorder 05/12/2023   Cognitive changes 05/12/2023   Fibroids 04/21/2023   Urinary incontinence 04/14/2023   Generalized edema 04/14/2023   Sensory loss 04/14/2023   Gait abnormality 03/03/2023   Paresthesia 03/03/2023   Seasonal and perennial allergic rhinitis 12/30/2022   Seasonal allergic conjunctivitis 12/30/2022   Poor sleep 07/25/2022   Weight gain 07/25/2022   Pure hypercholesterolemia 03/20/2022   Generalized obesity with starting BMI 32 03/06/2022   Low back pain 03/06/2022   Vitamin D  deficiency 03/06/2022   SOBOE (shortness of breath on exertion)  03/06/2022   Other fatigue 03/06/2022   Anxiety 07/31/2021   Gastroesophageal reflux disease without esophagitis 07/31/2021   Genital herpes simplex 07/31/2021   History of abnormal cervical Papanicolaou smear 07/31/2021   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   Subclinical hyperthyroidism 07/31/2021   Twin pregnancy 07/31/2021   Polycystic ovary syndrome 07/31/2021   Hashimoto's disease 05/21/2021   Thyroid  antibody positive 01/24/2021   Vitiligo 01/24/2021   Pregnancy 02/29/2020   Vaginal tear resulting from childbirth 02/29/2020   Hyperandrogenemia 02/04/2012   PCOS (polycystic ovarian syndrome) 02/04/2012    PCP: Gil Greig BRAVO, NP   REFERRING PROVIDER: Emeline Joesph BROCKS, DO   REFERRING DIAG: R32 (ICD-10-CM) - Urinary incontinence, unspecified type  THERAPY DIAG:  Unspecified lack of coordination  Rationale for Evaluation and Treatment: Rehabilitation  ONSET DATE: January 22nd, 2025   SUBJECTIVE:  SUBJECTIVE STATEMENT: Patient reports that she saw a vein/vascular specialist - ultrasounds were done on both legs and they found that multiple veins in her lower extremities are not working correctly. She is having a pelvic congestion syndrome because of this. She is in the process of being authorized for a venogram to learn more about this. Her abdomen and  back have been feeling achy, heavy, and full. She is getting a TVUS and endometrial biopsy tomorrow also to learn more about her pelvic pain. She is trying to figure out what is going on with her health - whether is it pelvic congestion, vein related, etc.   Eval: Patient reports to PT with urinary incontinence. She started experiencing pins and needles in her left leg on January 22nd of this year, and since then her numbness and tingling has gone into both  lower extremities and up to thoracic spine (T7-T8). Urinary incontinence began after childbirth with post-void dribble, but now she is peeing more and leaking more, unsure when. She feels like she doesn't fully empty her bladder.  Fluid intake: 2 40oz stanleys daily of water  PAIN:  Are you having pain? No NPRS scale: 0/10  PRECAUTIONS: None  RED FLAGS: None   WEIGHT BEARING RESTRICTIONS: No  FALLS:  Has patient fallen in last 6 months? 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   ACTIVITY LEVEL : enjoys walking for 20-30 minutes, yoga, core exercises at home   PLOF: Independent  PATIENT GOALS: to rule in/out muscular involvement, figure out why she is experiencing urinary symptoms   PERTINENT HISTORY:  Anemia, Anxiety, Back pain, Constipation, Hashimoto's disease, History of chicken pox, HSV infection, Joint pain, Lactose intolerance, Lower back pain, Palpitations, PCOS (polycystic ovarian syndrome), PCOS (polycystic ovarian syndrome), Right hip pain, SOBOE (shortness of breath on exertion), Thyroid  condition, Velamentous insertion of umbilical cord, Vitamin D  deficiency, and Vitiligo.  Sexual abuse: No  BOWEL MOVEMENT: Pain with bowel movement: No Type of bowel movement:Type (Bristol Stool Scale) 1-4, Frequency 1x/day, Strain not usually, and Splinting no Fully empty rectum: No Leakage: No Pads: No Fiber supplement/laxative Yes sometimes takes fiber supplements if constipated   URINATION: Pain with urination: No Fully empty bladder: Nohas post-void dribble Stream: Strong into weak flow  Urgency: Yes - hard to hold urgency  Frequency: every 2 hours at minimum, 1-2x/night  Leakage: unsure when it happens, but her underwear at wet at end of day  Pads: Yes: occasionally   INTERCOURSE: currently sexually active   Ability to have vaginal penetration Yes  Pain with intercourse: Initial Penetration, During Penetration, and Deep Penetration DrynessYes   Climax: yes Marinoff Scale: 2/3  PREGNANCY: Vaginal deliveries 1 Tearing Yes: 4th degree tear   PROLAPSE: Pressure  OBJECTIVE:  Note: Objective measures were completed at Evaluation unless otherwise noted.  DIAGNOSTIC FINDINGS:    -MRI lumbar and thoracic spine without contrast 02-27-2023, with findings of left paracentral disc perfusion/disc bulge at L5-S1 with possible left S1 nerve impingement.  Multilevel small degenerative disc bulges.               -CT renal stone study, MR cervical spine with and without contrast, and MRI brain with and without contrast 03/02/23 significant for small left incidental calcified uterine fibroid, lower cervical disc degeneration with small disc protrusions and facet hypertrophy atrophy at cervicothoracic junction; foraminal stenosis moderate left C6, mild left C7 and mild left C8    -Neurology evaluation by Dr. Onita 03-03-2023:   -  EMG done 2-25, 3 limb NCS with bilateral lower extremity EMG including bilateral lumbar paraspinals, all normal   - LP: Done 1-29,.  WBC 1, total protein 27, considered normal.                           -CT angiogram with and without contrast 03-05-2023 with findings of posterior wall enhancement suggesting fibroid; no vascular abnormalities.               -HIV, RPR, Lyme panel, drug screen, ESR, CRP, B12, ANA, heavy metal screen normal; TPO 206 from 367 02/19/23                         -- Note, elevated WBCs from 11.1-14.8 between 1-25 and 2-4; have normalized as of 2-11.  PATIENT SURVEYS:   PFIQ-7 = 14 07/09/23 PFIQ-7 = 14  COGNITION: Overall cognitive status: Within functional limits for tasks assessed     SENSATION: Light touch: Deficits right side L2/L3   LUMBAR SPECIAL TESTS:  Single leg stance test: Positive  FUNCTIONAL TESTS:  5 times sit to stand: 20 seconds, unsteadiness   GAIT: Assistive device utilized: Single point cane Comments: mild trendelenburg   POSTURE: rounded shoulders, forward head,  increased lumbar lordosis, and increased thoracic kyphosis   LUMBARAROM/PROM: within normal limits   A/PROM A/PROM  eval  Flexion   Extension   Right lateral flexion   Left lateral flexion   Right rotation   Left rotation    (Blank rows = not tested)  LOWER EXTREMITY ROM: within normal limits   Active ROM Right eval Left eval  Hip flexion    Hip extension    Hip abduction    Hip adduction    Hip internal rotation    Hip external rotation    Knee flexion    Knee extension    Ankle dorsiflexion    Ankle plantarflexion    Ankle inversion    Ankle eversion     (Blank rows = not tested)  LOWER EXTREMITY MMT:  MMT Right eval Left eval  Hip flexion 4 5  Hip extension    Hip abduction 4 4  Hip adduction 4 4  Hip internal rotation    Hip external rotation    Knee flexion 4 4  Knee extension 5 5  Ankle dorsiflexion    Ankle plantarflexion    Ankle inversion    Ankle eversion     (Blank rows = not tested) PALPATION:   General: stiffness present with squat testing, increased muscle tension noted in bilateral glutes and lumbar paraspinals/QL   Pelvic Alignment: within normal limits   Abdominal: upper chest breathing present, abdominal bracing at rest, decreased rib excursion with inhalation                 External Perineal Exam: deferred due to time                             Internal Pelvic Floor: deferred due to time   Patient confirms identification and approves PT to assess internal pelvic floor and treatment Yes - at follow up visit  No emotional/communication barriers or cognitive limitation. Patient is motivated to learn. Patient understands and agrees with treatment goals and plan. PT explains patient will be examined in standing, sitting, and lying down to see how their muscles and joints work. When  they are ready, they will be asked to remove their underwear so PT can examine their perineum. The patient is also given the option of providing their own  chaperone as one is not provided in our facility. The patient also has the right and is explained the right to defer or refuse any part of the evaluation or treatment including the internal exam. With the patient's consent, PT will use one gloved finger to gently assess the muscles of the pelvic floor, seeing how well it contracts and relaxes and if there is muscle symmetry. After, the patient will get dressed and PT and patient will discuss exam findings and plan of care. PT and patient discuss plan of care, schedule, attendance policy and HEP activities.  PELVIC MMT:   MMT eval  Vaginal 3/5 , 5 quick flicks, 5 second hold  Internal Anal Sphincter   External Anal Sphincter   Puborectalis   Diastasis Recti   (Blank rows = not tested)        TONE: Within normal limits with internal examination   PROLAPSE: N/A  TODAY'S TREATMENT:                                                                                                                              DATE:   06/10/23: Neuro re-ed/self care: Seated pelvic floor lengthening with inhale, shortening with exhale 2x10  Seated pelvic floor quick flicks + diaphragmatic breathing 2x10  Sidelying clam + reverse clam + diaphragmatic breathing 2x10  Therapeutic exercise: Sit to stand with ball between knees + diaphragmatic breathing 2x10  Bridge + ball squeeze + diaphragmatic breathing 2x10  Supine butterfly stretch + diaphragmatic breathing 2x6min  Pretzel piriformis stretch + diaphragmatic breathing 2x21min  Manual therapy  Trigger Point Dry Needling Initial Treatment: Pt instructed on Dry Needling rational, procedures, and possible side effects. Pt instructed to expect mild to moderate muscle soreness later in the day and/or into the next day.  Pt instructed in methods to reduce muscle soreness. Pt instructed to continue prescribed HEP. Because Dry Needling was performed over or adjacent to a lung field, pt was educated on S/S of  pneumothorax and to seek immediate medical attention should they occur.  Patient was educated on signs and symptoms of infection and other risk factors and advised to seek medical attention should they occur.  Patient verbalized understanding of these instructions and education.   Patient Verbal Consent Given: Yes Education Handout Provided: Yes Muscles Treated: bilateral lumbar multifidi L3/L4 Electrical Stimulation Performed: No Treatment Response/Outcome: decreased lumbar tension on right side paraspinals, improved lumbar extension  06/18/23: Neuro re-ed/self care: standing pelvic floor contraction with inhale/exhale for full range of motion 3x10 Sumo squat holding 10# weight + diaphragmatic breathing 2x10  Reverse lunge on slider + diaphragmatic breathing 2x10  Therapeutic exercise  Adductor rocking stretch in qped + diaphragmatic breathing 2x65min each side  Qped internal rotation rocking + diaphragmatic breathing 2x92min  Hip flexor/adductor stretch  off side of bed 2x66min each side   07/09/23: Neuro re-ed/self care: Hooklying diaphragmatic breathing + pelvic floor lengthening with inhalation and shortening with exhalation 2x10 breaths  Manual therapy  Internal vaginal examination + treatment of pelvic floor muscles 1/2/3 Bilateral soft tissue mobilization to each levator ani and obturator internus muscle with diaphragmatic breathing interventions to promote further relaxation Lateral vaginal canal stretching + deep inhalation 2x2 breaths each side   08/06/23: discharge   PATIENT EDUCATION:  Education details: Urge suppression technique and how to perform the urge drill when urgency is high, Bladder diary log to track urinary habits, urgency, and leakage patterns  Person educated: Patient Education method: Explanation, Demonstration, Tactile cues, Verbal cues, and Handouts Education comprehension: verbalized understanding, returned demonstration, verbal cues required, tactile cues  required, and needs further education  HOME EXERCISE PROGRAM: Access Code: [email or web address] URL: [email or web address] Date: 06/18/2023 Prepared by: Celena Domino  Exercises - Standing Pelvic Floor Contraction  - 1 x daily - 7 x weekly - 2 sets - 10 reps - Seated Quick Flick Pelvic Floor Contractions  - 1 x daily - 7 x weekly - 2 sets - 10 reps - Sumo Squat with Dumbbell  - 1 x daily - 7 x weekly - 2 sets - 8-10 reps - Reverse Lunge on Slider  - 1 x daily - 7 x weekly - 2 sets - 5-8 reps - Quadruped Rocking Backward  - 1 x daily - 7 x weekly - 2 sets - 10 reps - Kneeling Adductor Stretch with Hip External Rotation  - 1 x daily - 7 x weekly - 2 sets - 10 reps - Hip Flexor Stretch at Edge of Bed  - 1 x daily - 7 x weekly - 2 sets - hold - Sidelying Thoracic Rotation with Open Book  - 1 x daily - 7 x weekly - 2 sets - 10 reps - Supine Butterfly Groin Stretch  - 1 x daily - 7 x weekly - 2 sets - hold  ASSESSMENT:  CLINICAL IMPRESSION: Patient is a 38 y.o. female  who was seen today for physical therapy treatment for urinary incontinence. She has attended 6 sessions of PFPT, and at this time, is discharging due to changes in her medical status. Patient needs to learn more about her health in the meantime, so she wishes to discharge today and return to PFPT at a later date. All goals have been met or partially met, but patient is amenable to this at this time due to change in medical status. She is appropriate to discharge to HEP.  OBJECTIVE IMPAIRMENTS: decreased balance, decreased coordination, decreased endurance, decreased mobility, and difficulty walking.   ACTIVITY LIMITATIONS: carrying, lifting, bending, standing, squatting, stairs, transfers, and continence  PARTICIPATION LIMITATIONS: work - currently on short term disability due to the fact that she works as an Pharmacist, community at a skilled nursing facility   PERSONAL FACTORS: Past/current experiences and 3+ comorbidities: Anemia,  Anxiety, Back pain, Constipation, Hashimoto's disease, History of chicken pox, HSV infection, Joint pain, Lactose intolerance, Lower back pain, Palpitations, PCOS (polycystic ovarian syndrome), PCOS (polycystic ovarian syndrome), Right hip pain, SOBOE (shortness of breath on exertion), Thyroid  condition, Velamentous insertion of umbilical cord, Vitamin D  deficiency, and Vitiligo.  are also affecting patient's functional outcome.   REHAB POTENTIAL: Good  CLINICAL DECISION MAKING: Evolving/moderate complexity  EVALUATION COMPLEXITY: Moderate   GOALS: Goals reviewed with patient? Yes  SHORT TERM GOALS: Target date: 05/29/2023  Pt will be independent with HEP.  Baseline: Goal status: GOAL MET 07/09/23  2.  Pt will be independent with the knack, urge suppression technique, and double voiding in order to improve bladder habits and decrease urinary incontinence.   Baseline:  Goal status: GOAL MET 07/09/23  3.  Pt will be independent with diaphragmatic breathing and down training activities in order to improve pelvic floor relaxation.  Baseline:  Goal status: GOAL MET 07/09/23  LONG TERM GOALS: Target date: 11/01/2023  Pt will be independent with advanced HEP.  Baseline:  Goal status: GOAL MET 08/06/23  2.  Pt to demonstrate improved coordination of pelvic floor and breathing mechanics with 10# squat with appropriate synergistic patterns to decrease leakage at least 75% of the time.   Baseline:  Goal status: GOAL NOT MET   3.  Pt will demonstrate normal pelvic floor muscle tone and A/ROM, able to achieve 4/5 strength with contractions and 10 sec endurance, in order to provide appropriate lumbopelvic support in functional activities.   Baseline:  Goal status: GOAL NOT MET   4.  Pt will decrease frequency of nightly trips to the bathroom to 1 or less in order to get restful sleep and suggest improved urinary habits and control. Baseline:  Goal status: GOAL NOT MET   PHYSICAL THERAPY DISCHARGE  SUMMARY  Visits from Start of Care: 6  Current functional level related to goals / functional outcomes: See above   Remaining deficits: See above   Education / Equipment: See above   Patient agrees to discharge. Patient goals were partially met. Patient is being discharged due to a change in medical status.  Celena JAYSON Domino, PT 08/06/2023, 12:14 PM

## 2023-08-07 ENCOUNTER — Encounter: Payer: Self-pay | Admitting: Obstetrics and Gynecology

## 2023-08-07 ENCOUNTER — Other Ambulatory Visit (HOSPITAL_COMMUNITY)
Admission: RE | Admit: 2023-08-07 | Discharge: 2023-08-07 | Disposition: A | Discharge status: Home / Self Care | Source: Ambulatory Visit | Attending: Obstetrics and Gynecology | Admitting: Obstetrics and Gynecology

## 2023-08-07 ENCOUNTER — Ambulatory Visit (INDEPENDENT_AMBULATORY_CARE_PROVIDER_SITE_OTHER)

## 2023-08-07 ENCOUNTER — Ambulatory Visit (INDEPENDENT_AMBULATORY_CARE_PROVIDER_SITE_OTHER): Admitting: Obstetrics and Gynecology

## 2023-08-07 VITALS — BP 126/84 | HR 99

## 2023-08-07 DIAGNOSIS — N946 Dysmenorrhea, unspecified: Secondary | ICD-10-CM | POA: Insufficient documentation

## 2023-08-07 DIAGNOSIS — N920 Excessive and frequent menstruation with regular cycle: Secondary | ICD-10-CM | POA: Diagnosis present

## 2023-08-07 DIAGNOSIS — R102 Pelvic and perineal pain: Secondary | ICD-10-CM

## 2023-08-07 NOTE — Progress Notes (Unsigned)
 38 y.o. y.o. female here for pelvic pain, severe fatigue, edema since delivery of son 3 years ago. She is here today for PUS and EMB Periods heavier and more painful since then as well. Has noticed clots that are large during her cycle Fatigue affects ability to work and care for son. Is seeing other specialist as well to help determine cause. Had long labor of 19-20 hours with 4th degree tear with repair the following day. Feels something is not right since delivery Is going to pelvic PT Has had b/l lower leg tingling that sent her to the ER and has seen ortho and neurology Pregnancy and delivery was traumatic for patient and she is sure she does not want any more children She is aware of birth control as well but would like to do work-up first before adding this She would like and exam and would like cultures and blood work Last pap smear >2 years ago  Pelvic pain is worse a week before her cycle and during the cycle as well. Ua 3+ blood (on cycle now) Denies any n/v/d or blood in her stool  Today patient still reporting heaviness in legs that radiates up to her pelvis and causes discomfort in her lower pelvis She is waiting for PA for a venogram Reports the right side is more noticeable than the left. She is considering the [facility], but will wait on venogram findings and understands the procedure is permanent and she will not be able to conceive in the future.  Patient's last menstrual period was 08/04/2023 (exact date). Period Cycle (Days): 28 Period Duration (Days): 7 Period Pattern: Regular Menstrual Flow: Moderate Menstrual Control: Maxi pad Dysmenorrhea: (!) Moderate Dysmenorrhea Symptoms: Cramping  CT 2019 with possible fibroids.  Referral from [facility]. No records at visit today from them.   There is no height or weight on file to calculate BMI.   Blood pressure 126/84, pulse 99, last menstrual period 08/04/2023, SpO2 99%, unknown if currently  breastfeeding.  No results found for: DIAGPAP, HPVHIGH, ADEQPAP  GYN HISTORY: No results found for: DIAGPAP, HPVHIGH, ADEQPAP  OB History  Gravida Para Term Preterm AB Living  1 1 1  0 0 1  SAB IAB Ectopic Multiple Live Births  0 0 0 0 1    # Outcome Date GA Lbr Len/2nd Weight Sex Type Anes PTL Lv  1 Term 02/29/20 108w0d 07:32 / 01:19 7 lb 13.4 oz (3.555 kg) M Vag-Spont EPI  LIV    Past Medical History:  Diagnosis Date   Anemia    Anxiety    Back pain    Constipation    Hashimoto's disease    History of chicken pox    HSV infection    Joint pain    Lactose intolerance    Lower back pain    Palpitations    PCOS (polycystic ovarian syndrome)    PCOS (polycystic ovarian syndrome)    Right hip pain    SOBOE (shortness of breath on exertion)    Thyroid  condition    Velamentous insertion of umbilical cord    Vitamin D  deficiency    Vitiligo     Past Surgical History:  Procedure Laterality Date   NO PAST SURGERIES     PERINEAL LACERATION REPAIR N/A 03/01/2020   Procedure: SUTURE REPAIR PERINEAL LACERATION;  Surgeon: Marget Lenis, MD;  Location: [facility] LD ORS;  Service: Gynecology;  Laterality: N/A;    Current Outpatient Medications on File Prior to  Visit  Medication Sig Dispense Refill   cetirizine (ZYRTEC) 10 MG tablet Take 10 mg by mouth at bedtime.     famotidine (PEPCID) 40 MG/5ML suspension Take by mouth daily.     gabapentin  (NEURONTIN ) 300 MG capsule Take 2 capsules (600 mg total) by mouth 2 (two) times daily. Start with 300 mg at nighttime; if well tolerated at 1 week, can increase to 300 mg in the morning and 300 mg at nighttime, then in another week 300 mg in the morning and 600 mg at nighttime 120 capsule 3   levothyroxine  (SYNTHROID ) 25 MCG tablet Take 1 tablet (25 mcg total) by mouth daily. 90 tablet 3   valACYclovir  (VALTREX ) 500 MG tablet Take 1 tablet (500 mg total) by mouth 2 (two) times daily. For suppression, can start after taking treatment dosing  of active infection 180 tablet 3   No current facility-administered medications on file prior to visit.    Social History   Socioeconomic History   Marital status: Significant Other    Spouse name: Okey   Number of children: Not on file   Years of education: 16+   Highest education level: Not on file  Occupational History   Occupation: Occupational therapist   Occupation: Acupuncturist  Tobacco Use   Smoking status: Never    Passive exposure: Past   Smokeless tobacco: Never  Vaping Use   Vaping status: Never Used  Substance and Sexual Activity   Alcohol use: Not Currently    Alcohol/week: 1.0 standard drink of alcohol    Types: 1 Glasses of wine per week   Drug use: No   Sexual activity: Yes    Partners: Male    Birth control/protection: Condom  Other Topics Concern   Not on file  Social History Narrative   Regular exercise-yes Caffeine Use-yes   Social Drivers of Health   Financial Resource Strain: Not on file  Food Insecurity: Not on file  Transportation Needs: Not on file  Physical Activity: Not on file  Stress: Not on file  Social Connections: Unknown (06/03/2021)   Received from [facility]   Social Network    Social Network: Not on file  Intimate Partner Violence: Unknown (05/09/2021)   Received from [facility]   HITS    Physically Hurt: Not on file    Insult or Talk Down To: Not on file    Threaten Physical Harm: Not on file    Scream or Curse: Not on file    Family History  Problem Relation Age of Onset   Allergic rhinitis Mother    Urticaria Mother    Asthma Mother    Thyroid  disease Mother    Hypertension Mother    Cancer Mother        Cervical Cancer   Heart disease Mother    Depression Mother    Anxiety disorder Mother    Obesity Mother    Hypertension Father    Hepatitis C Father    Allergic rhinitis Brother    Asthma Brother    Allergic rhinitis Brother    Urticaria Brother    Allergic rhinitis Brother    Breast  cancer Maternal Aunt 50   Thyroid  disease Maternal Aunt    Ovarian cancer Paternal Aunt 50   Thyroid  disease Maternal Grandmother    Stroke Neg Hx    Hyperlipidemia Neg Hx    Early death Neg Hx      Allergies  Allergen Reactions   Banana Nausea And Vomiting  Spinach Nausea And Vomiting    Stomach cramps      Patient's last menstrual period was Patient's last menstrual period was 08/04/2023 (exact date)..             Review of Systems Alls systems reviewed and are negative.    From last visit Physical Exam Constitutional:      Appearance: Normal appearance.  Genitourinary:     Vulva normal.     No lesions in the vagina.     Genitourinary Comments: Possible fibroids vs. Right ovarian cyst. Boggy and tender contour      Right Labia: No rash, lesions or skin changes.    Left Labia: No lesions, skin changes or rash.    Vaginal cuff intact.    No vaginal discharge or tenderness.     No vaginal prolapse present.    No vaginal atrophy present.     Right Adnexa: not tender and no mass present.    Left Adnexa: not tender and no mass present.    Cervix is not absent.     Uterus is enlarged, tender and irregular.     Uterus is not absent. Breasts:    Right: Normal.     Left: Normal.  HENT:     Head: Normocephalic.  Neck:     Thyroid : No thyroid  mass, thyromegaly or thyroid  tenderness.  Cardiovascular:     Rate and Rhythm: Normal rate and regular rhythm.     Heart sounds: Normal heart sounds, S1 normal and S2 normal.  Pulmonary:     Effort: Pulmonary effort is normal.     Breath sounds: Normal breath sounds and air entry.  Abdominal:     General: There is no distension.     Palpations: Abdomen is soft. There is no mass.     Tenderness: There is no abdominal tenderness. There is no guarding or rebound.  Musculoskeletal:        General: Normal range of motion.     Cervical back: Full passive range of motion without pain, normal range of motion and neck supple. No  tenderness.     Right lower leg: No edema.     Left lower leg: No edema.  Neurological:     Mental Status: She is alert.  Skin:    General: Skin is warm.  Psychiatric:        Mood and Affect: Mood normal.        Behavior: Behavior normal.        Thought Content: Thought content normal.  Vitals and nursing note reviewed. Exam conducted with a chaperone present.    PUS 10.23cm uterus Normal size and shape 3 intramural fibroids noted largest 20x29mm EML8.62mm Normal ovaries   A:       pelvic pain Menorrhagia Dysmenorrhea H/o 4th degree tear with perianal repair after delivery Fibroids                              P:         Discussed possible differential diagnosis such as pelvic congestion syndrome, chronic post partum endometritis, adenomyosis, endometriosis, fibroids, vaginal infection Labs: see orders To return for annual, EMB, PUS.  Patient is aware we will need to do separate visits Nu swab sent today  No follow-ups on file.  [name]

## 2023-08-09 ENCOUNTER — Encounter: Payer: Self-pay | Admitting: Obstetrics and Gynecology

## 2023-08-11 ENCOUNTER — Other Ambulatory Visit: Payer: Self-pay

## 2023-08-11 MED ORDER — NORETHINDRONE 0.35 MG PO TABS: 1.0000 | ORAL_TABLET | Freq: Every day | ORAL | 6 refills | Status: DC

## 2023-08-12 ENCOUNTER — Ambulatory Visit: Payer: Self-pay | Admitting: Obstetrics and Gynecology

## 2023-08-12 LAB — SURGICAL PATHOLOGY

## 2023-08-13 NOTE — Telephone Encounter (Signed)
 No Cipro  Rx available for 400 mg. There is a 100 mg, 250 mg and 500 mg.   Please review and advise if continuing with current dosage, 100 mg tab (4 tabs) BID?

## 2023-08-14 MED ORDER — METRONIDAZOLE 500 MG PO TABS: 500.0000 mg | ORAL_TABLET | Freq: Two times a day (BID) | ORAL | 0 refills | Status: AC

## 2023-08-14 MED ORDER — CIPROFLOXACIN HCL 500 MG PO TABS: 500.0000 mg | ORAL_TABLET | Freq: Two times a day (BID) | ORAL | 0 refills | Status: AC

## 2023-08-14 MED ORDER — FLUCONAZOLE 150 MG PO TABS
150.0000 mg | ORAL_TABLET | Freq: Once | ORAL | 0 refills | Status: AC
Start: 2023-08-14 — End: 2023-08-14

## 2023-08-14 NOTE — Telephone Encounter (Signed)
 Call placed to patient, left detailed message ok per dpr. Advised of Cipro  Rx along with flagyl  and diflucan  have been sent to [facility] on file. Return call if any additional questions.   Encounter closed.

## 2023-08-21 ENCOUNTER — Ambulatory Visit (INDEPENDENT_AMBULATORY_CARE_PROVIDER_SITE_OTHER): Admitting: Obstetrics and Gynecology

## 2023-08-21 ENCOUNTER — Encounter: Payer: Self-pay | Admitting: Obstetrics and Gynecology

## 2023-08-21 ENCOUNTER — Other Ambulatory Visit (HOSPITAL_COMMUNITY)
Admission: RE | Admit: 2023-08-21 | Discharge: 2023-08-21 | Disposition: A | Discharge status: Home / Self Care | Source: Ambulatory Visit | Attending: Obstetrics and Gynecology | Admitting: Obstetrics and Gynecology

## 2023-08-21 VITALS — BP 118/80 | HR 78 | Resp 16 | Ht 63.25 in | Wt 195.0 lb

## 2023-08-21 DIAGNOSIS — N898 Other specified noninflammatory disorders of vagina: Secondary | ICD-10-CM

## 2023-08-21 DIAGNOSIS — Z01419 Encounter for gynecological examination (general) (routine) without abnormal findings: Secondary | ICD-10-CM | POA: Insufficient documentation

## 2023-08-21 MED ORDER — FLUCONAZOLE 150 MG PO TABS: 150.0000 mg | ORAL_TABLET | Freq: Once | ORAL | 0 refills | Status: DC

## 2023-08-21 MED ORDER — FLUCONAZOLE 150 MG PO TABS: 150.0000 mg | ORAL_TABLET | Freq: Once | ORAL | 0 refills | Status: AC

## 2023-08-21 NOTE — Progress Notes (Addendum)
 38 y.o. y.o. female here for annual exam  pelvic pain, severe fatigue, edema since delivery of son 3 years ago.  Periods heavier and more painful since then as well. Has noticed clots that are large during her cycle Fatigue affects ability to work and care for son. Is seeing other specialist as well to help determine cause. Had long labor of 19-20 hours with 4th degree tear with repair the following day. Feels something is not right since delivery Is going to pelvic PT Has had b/l lower leg tingling that sent her to the ER and has seen ortho and neurology Pregnancy and delivery was traumatic for patient and she is sure she does not want any more children She is aware of birth control as well but would like to do work-up first before adding this  Last pap smear >2 years ago  Pelvic pain is worse a week before her cycle and during the cycle as well. Ua 3+ blood (on cycle now) Denies any n/v/d or blood in her stool  Today patient still reporting heaviness in legs that radiates up to her pelvis and causes discomfort in her lower pelvis She is waiting for PA for a venogram Reports the right side is more noticeable than the left. She is considering the [facility], but will wait on venogram findings and understands the procedure is permanent and she will not be able to conceive in the future. EMB with plasma cells. Started course of antibiotics and is on probiotic  Patient's last menstrual period was 08/04/2023 (exact date). Period Cycle (Days): 28 Period Duration (Days): 7 Period Pattern: Regular Menstrual Flow: Moderate Menstrual Control: Maxi pad Menstrual Control Change Freq (Hours): changes pad 3 times a day Dysmenorrhea: (!) Mild Dysmenorrhea Symptoms: Cramping  CT 2019 with possible fibroids.  Referral from [facility]. No records at visit today from them.   Body mass index is 34.27 kg/m.   Blood pressure 118/80, pulse 78, resp. rate 16, height 5' 3.25 (1.607  m), weight 195 lb (88.5 kg), last menstrual period 08/04/2023, unknown if currently breastfeeding.  No results found for: DIAGPAP, HPVHIGH, ADEQPAP  GYN HISTORY: No results found for: DIAGPAP, HPVHIGH, ADEQPAP No results found for: DIAGPAP, HPVHIGH, ADEQPAP  High Risk HPV: Positive  Adequacy:  Satisfactory for evaluation, transformation zone component PRESENT  Diagnosis:  Atypical squamous cells of undetermined significance (ASC-US )   OB History  Gravida Para Term Preterm AB Living  1 1 1  0 0 1  SAB IAB Ectopic Multiple Live Births  0 0 0 0 1    # Outcome Date GA Lbr Len/2nd Weight Sex Type Anes PTL Lv  1 Term 02/29/20 [redacted]w[redacted]d 07:32 / 01:19 7 lb 13.4 oz (3.555 kg) M Vag-Spont EPI  LIV    Past Medical History:  Diagnosis Date   Anemia    Anxiety    Back pain    Constipation    Hashimoto's disease    History of chicken pox    HSV infection    Joint pain    Lactose intolerance    Lower back pain    Palpitations    PCOS (polycystic ovarian syndrome)    PCOS (polycystic ovarian syndrome)    Right hip pain    SOBOE (shortness of breath on exertion)    Thyroid  condition    Velamentous insertion of umbilical cord    Vitamin D  deficiency    Vitiligo     Past Surgical History:  Procedure Laterality Date  NO PAST SURGERIES     PERINEAL LACERATION REPAIR N/A 03/01/2020   Procedure: SUTURE REPAIR PERINEAL LACERATION;  Surgeon: Marget Lenis, MD;  Location: [facility] LD ORS;  Service: Gynecology;  Laterality: N/A;    Current Outpatient Medications on File Prior to Visit  Medication Sig Dispense Refill   cetirizine (ZYRTEC) 10 MG tablet Take 10 mg by mouth at bedtime.     ciprofloxacin  (CIPRO ) 500 MG tablet Take 1 tablet (500 mg total) by mouth 2 (two) times daily for 14 days. 28 tablet 0   famotidine (PEPCID) 40 MG/5ML suspension Take by mouth daily.     gabapentin  (NEURONTIN ) 300 MG capsule Take 2 capsules (600 mg total) by mouth 2 (two) times daily. Start with  300 mg at nighttime; if well tolerated at 1 week, can increase to 300 mg in the morning and 300 mg at nighttime, then in another week 300 mg in the morning and 600 mg at nighttime 120 capsule 3   levothyroxine  (SYNTHROID ) 25 MCG tablet Take 1 tablet (25 mcg total) by mouth daily. 90 tablet 3   metroNIDAZOLE  (FLAGYL ) 500 MG tablet Take 1 tablet (500 mg total) by mouth 2 (two) times daily for 7 days. 14 tablet 0   valACYclovir  (VALTREX ) 500 MG tablet Take 1 tablet (500 mg total) by mouth 2 (two) times daily. For suppression, can start after taking treatment dosing of active infection 180 tablet 3   norethindrone  (MICRONOR ) 0.35 MG tablet Take 1 tablet (0.35 mg total) by mouth daily. Take continuously at night. (Patient not taking: Reported on 08/21/2023) 84 tablet 6   No current facility-administered medications on file prior to visit.    Social History   Socioeconomic History   Marital status: Significant Other    Spouse name: Okey   Number of children: Not on file   Years of education: 16+   Highest education level: Not on file  Occupational History   Occupation: Occupational therapist   Occupation: Acupuncturist  Tobacco Use   Smoking status: Never    Passive exposure: Past   Smokeless tobacco: Never  Vaping Use   Vaping status: Never Used  Substance and Sexual Activity   Alcohol use: Not Currently    Alcohol/week: 1.0 standard drink of alcohol    Types: 1 Glasses of wine per week   Drug use: No   Sexual activity: Yes    Partners: Male    Birth control/protection: Condom  Other Topics Concern   Not on file  Social History Narrative   Regular exercise-yes Caffeine Use-yes   Social Drivers of Health   Financial Resource Strain: Not on file  Food Insecurity: Not on file  Transportation Needs: Not on file  Physical Activity: Not on file  Stress: Not on file  Social Connections: Unknown (06/03/2021)   [facility]   Social Network    Social Network:  Not on file  Intimate Partner Violence: Unknown (05/09/2021)   Received from [facility]   HITS    Physically Hurt: Not on file    Insult or Talk Down To: Not on file    Threaten Physical Harm: Not on file    Scream or Curse: Not on file    Family History  Problem Relation Age of Onset   Allergic rhinitis Mother    Urticaria Mother    Asthma Mother    Thyroid  disease Mother    Hypertension Mother    Cancer Mother  Cervical Cancer   Heart disease Mother    Depression Mother    Anxiety disorder Mother    Obesity Mother    Hypertension Father    Hepatitis C Father    Allergic rhinitis Brother    Asthma Brother    Allergic rhinitis Brother    Urticaria Brother    Allergic rhinitis Brother    Breast cancer Maternal Aunt 50   Thyroid  disease Maternal Aunt    Ovarian cancer Paternal Aunt 13   Thyroid  disease Maternal Grandmother    Stroke Neg Hx    Hyperlipidemia Neg Hx    Early death Neg Hx      Allergies  Allergen Reactions   Banana Nausea And Vomiting   Spinach Nausea And Vomiting    Stomach cramps      Patient's last menstrual period was Patient's last menstrual period was 08/04/2023 (exact date)..             Review of Systems Alls systems reviewed and are negative.    From last visit: Physical Exam Constitutional:      Appearance: Normal appearance.  Genitourinary:     Vulva normal.     No lesions in the vagina.     Genitourinary Comments: Possible fibroids vs. Right ovarian cyst. Boggy and tender contour  from last exam  Discharge today c/w yeast     Right Labia: No rash, lesions or skin changes.    Left Labia: No lesions, skin changes or rash.    Vaginal cuff intact.    No vaginal discharge or tenderness.     No vaginal prolapse present.    No vaginal atrophy present.     Right Adnexa: not tender and no mass present.    Left Adnexa: not tender and no mass present.    Cervix is not absent.  Breasts:    Right: Normal.     Left:  Normal.  HENT:     Head: Normocephalic.  Neck:     Thyroid : No thyroid  mass, thyromegaly or thyroid  tenderness.  Cardiovascular:     Rate and Rhythm: Normal rate and regular rhythm.     Heart sounds: Normal heart sounds, S1 normal and S2 normal.  Pulmonary:     Effort: Pulmonary effort is normal.     Breath sounds: Normal breath sounds and air entry.  Abdominal:     General: There is no distension.     Palpations: Abdomen is soft. There is no mass.     Tenderness: There is no abdominal tenderness. There is no guarding or rebound.  Musculoskeletal:        General: Normal range of motion.     Cervical back: Full passive range of motion without pain, normal range of motion and neck supple. No tenderness.     Right lower leg: No edema.     Left lower leg: No edema.  Neurological:     Mental Status: She is alert.  Skin:    General: Skin is warm.  Psychiatric:        Mood and Affect: Mood normal.        Behavior: Behavior normal.        Thought Content: Thought content normal.  Vitals and nursing note reviewed. Exam conducted with a chaperone present.    PUS 10.23cm uterus Normal size and shape 3 intramural fibroids noted largest 20x36mm EML8.57mm Normal ovaries   A:       pelvic pain Menorrhagia Dysmenorrhea H/o 4th degree tear with  perianal repair after delivery Fibroids Likely adenomyosis PP endometritis(plasma cells on EMB) with clinical features of pain on exam started antibiotic treatment on Saturday To schedule venogram to rule out pelvic congestion syndrome                             P:         Pap smear collected She was encouraged to follow with persistent pain or symptoms.  Working with IVR for possible pelvic congestion Nuswab sent Finish full course of antibiotics. Take diflucan  today and again when she finishes her antibiotics Counseled on healthy lifestyle practices H/o abnormal paps in the past. Gardesil vaccination started and she has last 2 doses  scheduled.  No follow-ups on file.  Almarie MARLA Carpen

## 2023-08-22 ENCOUNTER — Encounter: Attending: Psychology | Admitting: Psychology

## 2023-08-22 DIAGNOSIS — R269 Unspecified abnormalities of gait and mobility: Secondary | ICD-10-CM | POA: Insufficient documentation

## 2023-08-22 DIAGNOSIS — K5900 Constipation, unspecified: Secondary | ICD-10-CM | POA: Insufficient documentation

## 2023-08-22 DIAGNOSIS — R601 Generalized edema: Secondary | ICD-10-CM | POA: Insufficient documentation

## 2023-08-22 DIAGNOSIS — D219 Benign neoplasm of connective and other soft tissue, unspecified: Secondary | ICD-10-CM | POA: Insufficient documentation

## 2023-08-22 DIAGNOSIS — R4189 Other symptoms and signs involving cognitive functions and awareness: Secondary | ICD-10-CM

## 2023-08-22 DIAGNOSIS — R200 Anesthesia of skin: Secondary | ICD-10-CM | POA: Insufficient documentation

## 2023-08-22 DIAGNOSIS — F419 Anxiety disorder, unspecified: Secondary | ICD-10-CM

## 2023-08-22 DIAGNOSIS — E063 Autoimmune thyroiditis: Secondary | ICD-10-CM | POA: Insufficient documentation

## 2023-08-22 LAB — SURESWAB® ADVANCED VAGINITIS PLUS,TMA
C. trachomatis RNA, TMA: NOT DETECTED
CANDIDA SPECIES: NOT DETECTED
Candida glabrata: NOT DETECTED
N. gonorrhoeae RNA, TMA: NOT DETECTED
SURESWAB(R) ADV BACTERIAL VAGINOSIS(BV),TMA: NEGATIVE
TRICHOMONAS VAGINALIS (TV),TMA: NOT DETECTED

## 2023-08-25 ENCOUNTER — Ambulatory Visit: Payer: Self-pay | Admitting: Obstetrics and Gynecology

## 2023-08-26 LAB — CYTOLOGY - PAP: Diagnosis: NEGATIVE

## 2023-09-16 NOTE — Progress Notes (Deleted)
 Office Visit Note  Patient: [name]             Date of Birth: 1985-09-20           MRN: [id number]             PCP: Gil Greig BRAVO, NP Referring: Gil Greig BRAVO, NP Visit Date: 09/17/2023 Occupation: @GUAROCC @  Subjective:  No chief complaint on file.   History of Present Illness: [name] is a 38 y.o. female ***     Activities of Daily Living:  Patient reports morning stiffness for *** {minute/hour:19697}.   Patient {ACTIONS;DENIES/REPORTS:21021675::Denies} nocturnal pain.  Difficulty dressing/grooming: {ACTIONS;DENIES/REPORTS:21021675::Denies} Difficulty climbing stairs: {ACTIONS;DENIES/REPORTS:21021675::Denies} Difficulty getting out of chair: {ACTIONS;DENIES/REPORTS:21021675::Denies} Difficulty using hands for taps, buttons, cutlery, and/or writing: {ACTIONS;DENIES/REPORTS:21021675::Denies}  No Rheumatology ROS completed.   PMFS History:  Patient Active Problem List   Diagnosis Date Noted   Constipation 07/15/2023   Persistent depressive disorder 05/12/2023   Cognitive changes 05/12/2023   Fibroids 04/21/2023   Urinary incontinence 04/14/2023   Generalized edema 04/14/2023   Sensory loss 04/14/2023   Gait abnormality 03/03/2023   Paresthesia 03/03/2023   Seasonal and perennial allergic rhinitis 12/30/2022   Seasonal allergic conjunctivitis 12/30/2022   Poor sleep 07/25/2022   Weight gain 07/25/2022   Pure hypercholesterolemia 03/20/2022   Generalized obesity with starting BMI 32 03/06/2022   Low back pain 03/06/2022   Vitamin D  deficiency 03/06/2022   SOBOE (shortness of breath on exertion) 03/06/2022   Other fatigue 03/06/2022   Anxiety 07/31/2021   Gastroesophageal reflux disease without esophagitis 07/31/2021   Genital herpes simplex 07/31/2021   History of abnormal cervical Papanicolaou smear 07/31/2021   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   Subclinical hyperthyroidism 07/31/2021   Twin pregnancy 07/31/2021   Polycystic ovary syndrome  07/31/2021   Hashimoto's disease 05/21/2021   Thyroid  antibody positive 01/24/2021   Vitiligo 01/24/2021   Pregnancy 02/29/2020   Vaginal tear resulting from childbirth 02/29/2020   Hyperandrogenemia 02/04/2012   PCOS (polycystic ovarian syndrome) 02/04/2012    Past Medical History:  Diagnosis Date   Anemia    Anxiety    Back pain    Constipation    Hashimoto's disease    History of chicken pox    HSV infection    Joint pain    Lactose intolerance    Lower back pain    Palpitations    PCOS (polycystic ovarian syndrome)    PCOS (polycystic ovarian syndrome)    Right hip pain    SOBOE (shortness of breath on exertion)    Thyroid  condition    Velamentous insertion of umbilical cord    Vitamin D  deficiency    Vitiligo     Family History  Problem Relation Age of Onset   Allergic rhinitis Mother    Urticaria Mother    Asthma Mother    Thyroid  disease Mother    Hypertension Mother    Cancer Mother        Cervical Cancer   Heart disease Mother    Depression Mother    Anxiety disorder Mother    Obesity Mother    Hypertension Father    Hepatitis C Father    Allergic rhinitis Brother    Asthma Brother    Allergic rhinitis Brother    Urticaria Brother    Allergic rhinitis Brother    Breast cancer Maternal Aunt 50   Thyroid  disease Maternal Aunt    Ovarian cancer Paternal Aunt 50   Thyroid  disease Maternal Grandmother  Stroke Neg Hx    Hyperlipidemia Neg Hx    Early death Neg Hx    Past Surgical History:  Procedure Laterality Date   NO PAST SURGERIES     PERINEAL LACERATION REPAIR N/A 03/01/2020   Procedure: SUTURE REPAIR PERINEAL LACERATION;  Surgeon: Marget Lenis, MD;  Location: [facility];  Service: Gynecology;  Laterality: N/A;   Social History   Social History Narrative   Regular exercise-yes Caffeine Use-yes   Immunization History  Administered Date(s) Administered   HPV 9-valent 07/25/2023   Influenza-Unspecified 12/10/2019   Moderna Covid-19 Vaccine  Bivalent Booster 57yrs & up 11/29/2019   Moderna Sars-Covid-2 Vaccination 03/09/2019, 04/06/2019   Tdap 12/06/2019     Objective: Vital Signs: There were no vitals taken for this visit.   Physical Exam   Musculoskeletal Exam: ***  CDAI Exam: CDAI Score: -- Patient Global: --; Provider Global: -- Swollen: --; Tender: -- Joint Exam 09/17/2023   No joint exam has been documented for this visit   There is currently no information documented on the homunculus. Go to the Rheumatology activity and complete the homunculus joint exam.  Investigation: No additional findings.  Imaging: No results found.  Recent Labs: Lab Results  Component Value Date   WBC 8.7 03/18/2023   HGB 13.6 03/18/2023   PLT 231 03/18/2023   NA 140 03/11/2023   K 3.7 03/11/2023   CL 101 03/11/2023   CO2 24 03/11/2023   GLUCOSE 79 03/11/2023   BUN 12 03/11/2023   CREATININE 0.78 03/11/2023   BILITOT 0.2 03/11/2023   ALKPHOS 49 03/11/2023   AST 14 03/11/2023   ALT 12 03/11/2023   PROT 7.0 03/11/2023   ALBUMIN 4.4 03/11/2023   CALCIUM  9.5 03/11/2023   July 10, 2023 ANA 1: 80 NS, sed rate 12, CRP<1.0, vitamin D  34  July 23, 2023 TSH normal  Speciality Comments: No specialty comments available.  Procedures:  No procedures performed Allergies: Banana and Spinach   Assessment / Plan:     Visit Diagnoses: No diagnosis found.  Orders: No orders of the defined types were placed in this encounter.  No orders of the defined types were placed in this encounter.   Face-to-face time spent with patient was *** minutes. Greater than 50% of time was spent in counseling and coordination of care.  Follow-Up Instructions: No follow-ups on file.   Maya Nash, MD  Note - This record has been created using Animal nutritionist.  Chart creation errors have been sought, but may not always  have been located. Such creation errors do not reflect on  the standard of medical care.

## 2023-09-17 ENCOUNTER — Encounter: Payer: Self-pay | Admitting: Rheumatology

## 2023-09-17 ENCOUNTER — Encounter: Payer: Self-pay | Admitting: Physical Medicine and Rehabilitation

## 2023-09-17 ENCOUNTER — Encounter: Payer: Self-pay | Admitting: Orthopedic Surgery

## 2023-09-17 ENCOUNTER — Encounter (HOSPITAL_BASED_OUTPATIENT_CLINIC_OR_DEPARTMENT_OTHER): Payer: Self-pay

## 2023-09-17 ENCOUNTER — Encounter: Payer: Self-pay | Admitting: Obstetrics and Gynecology

## 2023-09-17 DIAGNOSIS — R202 Paresthesia of skin: Secondary | ICD-10-CM

## 2023-09-17 DIAGNOSIS — R768 Other specified abnormal immunological findings in serum: Secondary | ICD-10-CM

## 2023-09-17 DIAGNOSIS — A6000 Herpesviral infection of urogenital system, unspecified: Secondary | ICD-10-CM

## 2023-09-17 DIAGNOSIS — E281 Androgen excess: Secondary | ICD-10-CM

## 2023-09-17 DIAGNOSIS — L409 Psoriasis, unspecified: Secondary | ICD-10-CM

## 2023-09-17 DIAGNOSIS — E559 Vitamin D deficiency, unspecified: Secondary | ICD-10-CM

## 2023-09-17 DIAGNOSIS — Z72820 Sleep deprivation: Secondary | ICD-10-CM

## 2023-09-17 DIAGNOSIS — E282 Polycystic ovarian syndrome: Secondary | ICD-10-CM

## 2023-09-17 DIAGNOSIS — F419 Anxiety disorder, unspecified: Secondary | ICD-10-CM

## 2023-09-17 DIAGNOSIS — J302 Other seasonal allergic rhinitis: Secondary | ICD-10-CM

## 2023-09-17 DIAGNOSIS — K219 Gastro-esophageal reflux disease without esophagitis: Secondary | ICD-10-CM

## 2023-09-17 DIAGNOSIS — G8929 Other chronic pain: Secondary | ICD-10-CM

## 2023-09-17 DIAGNOSIS — E063 Autoimmune thyroiditis: Secondary | ICD-10-CM

## 2023-09-17 DIAGNOSIS — E7800 Pure hypercholesterolemia, unspecified: Secondary | ICD-10-CM

## 2023-09-17 DIAGNOSIS — F341 Dysthymic disorder: Secondary | ICD-10-CM

## 2023-09-17 NOTE — Telephone Encounter (Signed)
Message routed to PCP [place], Amy E, NP  

## 2023-09-22 NOTE — Progress Notes (Signed)
 NEUROPSYCHOLOGICAL EVALUATION [place]. [facility]  Physical Medicine and Rehabilitation     Patient: [name]  MRN: [id number] DOB: 09-15-1985  Age: 38 y.o. Sex: female  Race/Ethnicity: White or Caucasian  Years of Education: 18  Referring Provider: Gil Greig BRAVO, NP  Provider/Clinical Neuropsychologist: Evalene DOROTHA Riff, PsyD  Date of Service: 07/18/2023 Start Time: 3 PM End Time: 4 PM  Location of Service:  [facility] Physical Medicine & Rehabilitation Department . [facility] [address], [place] [place], [place] [address] Phone: [phone]  Billing Code/Service: [phone]  Individuals Present: Evalene Riff, PsyD 1 hour was spent on interpretation of patient data, interpretation of standardized test results and clinical data, clinical decision making, initial treatment planning/recommendations, and report writing. The report will be amended as needed based on any additional information collected during interactive feedback session.   REASON FOR REFERRAL:The patient was referred for neuropsychological evaluation due to concerns for cognitive changes and sensory deficits, anxiety, and possible assessment for somatization/somatic symptom disorder. Per records, the patient established care on 04/14/23 with Dr. Emeline at the PM&R clinic for treatment of bilateral lower extremity sensory loss that started 02/26/2023 on the right and then progressed to the left side. Aggressive workup was ordered. Per records from her 05/12/23 follow-up visit with PM&R; Ongoing work with PT was recommended given improvements in functional gains and there were additional medication adjustments. Neuropsychological evaluation was also ordered.   Upon interview, the patient indicated she was pursuing the evaluation hoping to find an explanation for her symptoms. She indicated she was looking for any kind of guidance or help, whether the issue is cognitive, emotional, or  due to some other cause.   HISTORY OF PRESENTING CONCERNS:  Cognitive Symptom Onset & Course: The patient indicated that she has not felt that she was at her baseline since her pregnancy three years ago (2022). She indicated there was a brief (few months) when she felt better, approximately two years after the birth of her son, but then the issues returned. She reported worsening of mental fog and fatigue, and onset of word-finding difficulty that accompanied the motor/sensory symptoms early this year. There have been several significant life events/stressors over the last several years (noted in emotional and behavioral functioning section below), but the patient did not view symptoms are directly related to those events. The patient reported discontinuing recent medications due to side effects and indicated she has been feeling clearer since doing so.   Current Cognitive Complaints: Per patient report during interview Memory: Forgetfulness (left car running in driveway), misplacing things, increased reliance on calendars.  Processing Speed: Endorsed feeling sluggish and added that she felt she is experience some depressive symptoms.   Attention & Concentration: Able to sustain concentration, but has more difficulty returning to a task if interrupted.   Language: Word-finding is improved relative to January but not back to baseline. Denied problems with receptive speech (if attending). No changes in writing abilities or mental arithmetic.  Visual-Spatial: She described getting her clothing snagged on things from inadvertently bumping into things (right side only).   Executive Functioning: Denied problems with decision making or problem solving. Endorsed decline in premorbid difficulties in adhering to a planned schedule, and difficulty with reliably being on time. No marked personality changes suggestive of frontal dysfunction, but behavioral changes consistent with depressive symptomology  (discussed below).   Motor/Sensory Complaints: The patient described motor changes consistent with those noted by the referring provider. Currently, she stated  that she has had some improvement in mobility, but I'm still limping, and my feet are constantly on fire. She indicated that she has some trouble with balance that she suspects is because I can't feel where I'm stepping. She described some issues with her vision, particularly on the right side. She does not have a field cut. No significant hearing difficulties were noted.   Emotional and Behavioral Functioning: The patient endorsed some indications of depressive symptoms at present. She described feeling emotionally kind of flat and unlike her usual self. She described some indications of slight anhedonia, but largely is still able to find enjoyment in activities. She denied problems with feelings of guilt, hopelessness, or suicidally. She reported that she felt she may have had low grade depressive symptoms starting sometime during adulthood. She has never been diagnosed or formally treated for it. IN terms of course, she indicated she felt she has struggled slightly more so with mood since her pregnancy (2022). She described challenges associated with protracted recovery due to physical health consequences from the delivery. Approximately two year after the birth of her son, she indicated she started feeling better, but then this worsened again last year and more so within the last several months.     With respect to anxiety, the patient described herself as being a worrier in general. She endorsed having trouble relaxing, which has been long running but which began in adulthood. She reported experiencing a panic attack in the past, but none recently.   The patient experienced several notable stressors over the last few years. She experienced challenges from the physical recovery from delivery.  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 In late 2023 her mother passed from medical complications. Events surrounding her death were what most people would consider traumatic. The patient described struggling with PTSD-related symptomology in response to stimuli reminder her of events surrounding her mother, which she faced frequently in her work in [place]. She indicated this has not occurred since she stopped working in that environment. She reported that she felt she is in a better place, psychologically, with respect to the trauma. Another notable life event involved purchasing a new home last summer. The patient described struggling with her physical health changes due to both the symptoms themselves, uncertainty around cause, and the negative impact on her life (e.g., she used to exercise regularly and this was a core coping strategy for her, but is no longer able to do so). She cited this struggle as being a primary contributor to her mental health difficulties. She reported limited success in finding new coping strategies to support mental health.   She denied any current or past suicidal ideation, homicidal ideation, paranoia, hallucination, symptoms of mania, or history of inpatient psychiatric admission.   Sleep: The patient described difficulties with sleep onset and returning to bed if waking up during the night. She indicated this has been going on since January. She reported getting about 5-6 hours of sleep per night on average, but typically needs 7 to feel rested.   Appetite: She described poor appetite which she links to feelings of physical discomfort. This has been present for abotu four weeks.   Caffeine: She reported a recent reduction in coffee to about 2 cups per day. She felt she had been drinking too much caffeine in recent months.   Alcohol Use: None Tobacco  Use: None Recreational Substance Use: None currently  or recently.    Level of Functional Independence: The patient is intact with basic and instrumental activities of daily living.   Medical History/Record Review: Per records and patient report; History of traumatic brain injury/concussion: None   History of stroke: None   History of heart attack: None   History of cancer/chemotherapy:None   History of seizure activity: None   Symptoms of chronic pain: Yes; Continual chronic pain, 7 out of 10 in severity. Distracts herself by staying busy when possible.    Experience of frequent headaches/migraines: No    Past Medical History:  Diagnosis Date   Anemia    Anxiety    Back pain    Constipation    Hashimoto's disease    History of chicken pox    HSV infection    Joint pain    Lactose intolerance    Lower back pain    Palpitations    PCOS (polycystic ovarian syndrome)    PCOS (polycystic ovarian syndrome)    Right hip pain    SOBOE (shortness of breath on exertion)    Thyroid  condition    Velamentous insertion of umbilical cord    Vitamin D  deficiency    Vitiligo    Patient Active Problem List   Diagnosis Date Noted   Constipation 07/15/2023   Persistent depressive disorder 05/12/2023   Cognitive changes 05/12/2023   Fibroids 04/21/2023   Urinary incontinence 04/14/2023   Generalized edema 04/14/2023   Sensory loss 04/14/2023   Gait abnormality 03/03/2023   Paresthesia 03/03/2023   Seasonal and perennial allergic rhinitis 12/30/2022   Seasonal allergic conjunctivitis 12/30/2022   Poor sleep 07/25/2022   Weight gain 07/25/2022   Pure hypercholesterolemia 03/20/2022   Generalized obesity with starting BMI 32 03/06/2022   Low back pain 03/06/2022   Vitamin D  deficiency 03/06/2022   SOBOE (shortness of breath on exertion) 03/06/2022   Other fatigue 03/06/2022   Anxiety 07/31/2021   Gastroesophageal reflux disease without esophagitis 07/31/2021   Genital herpes simplex 07/31/2021   History of abnormal cervical  Papanicolaou smear 07/31/2021   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   Subclinical hyperthyroidism 07/31/2021   Twin pregnancy 07/31/2021   Polycystic ovary syndrome 07/31/2021   Hashimoto's disease 05/21/2021   Thyroid  antibody positive 01/24/2021   Vitiligo 01/24/2021   Pregnancy 02/29/2020   Vaginal tear resulting from childbirth 02/29/2020   Hyperandrogenemia 02/04/2012   PCOS (polycystic ovarian syndrome) 02/04/2012   Imaging/Lab Results:  03/02/2023 MRI brain with and without contrast: IMPRESSION: Normal MRI appearance of the Brain.  Family Neurologic/Medical Hx:  Family History  Problem Relation Age of Onset   Allergic rhinitis Mother    Urticaria Mother    Asthma Mother    Thyroid  disease Mother    Hypertension Mother    Cancer Mother        Cervical Cancer   Heart disease Mother    Depression Mother    Anxiety disorder Mother    Obesity Mother    Hypertension Father    Hepatitis C Father    Allergic rhinitis Brother    Asthma Brother    Allergic rhinitis Brother    Urticaria Brother    Allergic rhinitis Brother    Breast cancer Maternal Aunt 50   Thyroid  disease Maternal Aunt    Ovarian cancer Paternal Aunt 50   Thyroid  disease Maternal Grandmother    Stroke Neg Hx    Hyperlipidemia Neg Hx    Early death Neg Hx  Medications:  cetirizine (ZYRTEC) 10 MG tablet famotidine (PEPCID) 40 MG/5ML suspension gabapentin  (NEURONTIN ) 300 MG capsule levothyroxine  (SYNTHROID ) 25 MCG tablet norethindrone  (MICRONOR ) 0.35 MG tablet valACYclovir  (VALTREX ) 500 MG tablet   Academic/Vocational History: Highest level of educational attainment: Masters, 3.2 GPA.  History of developmental delay:No History of grade repetition:No Enrollment in special education courses:No History of LD/ADHD: No indications. Employment:Not currently working. On short term disability.   Psychosocial: Marital Status: Committed relationship since 2018. Children/Grandchildren: Son Living Situation:  With partner and son in her home.  NEUROPSYCHODIAGNOSTIC FINDINGS:  Behavioral Observations (07/14/2023): The patient's hearing and vision were adequate for testing. She ambulated independently and without issue. No hand tremor was noted during testing. Her speech was prosodic, fluent, and well-articulated. She displayed no clear indications of word-finding difficulties in conversational speech and no notable paraphasic errors were noted. Receptive language appeared intact. The patient's affect was congruent with mood and her mood was largely neutral to positive. The patient was alert and participated in testing as instructed. She was cooperative throughout the session. Her pace was steady. She showed no difficulties with frustration tolerance. The patient's social interactions were unremarkable and consistent with the setting. No frank attentional lapses were appreciated.  Tests Administered: Brief Visuospatial Memory Test-Revised (BVMT-R) Benton Judgment of Line Orientation (JOLO) [place]  Verbal Learning Test-Third Edition (CVLT-3) Conners Continuous Performance Test 3rd Edition (CPT-3) Delis-Kaplan Executive Function System (D-KEFS), select subtests Grooved Pegboard Test Neuropsychological Assessment Battery (NAB), select subtest Rey Complex Figure Test (RCFT), select subtests Wechsler Adult Intelligence Scale-Fourth Edition (WAIS-IV), select subtests Wechsler Memory Scale-Fourth Edition (WMS-IV) , select subtests Wechsler Test of Adult Reading (WTAR) [place]  Card Sorting Test [id number]) The Beck Depression Inventory-II (BDI-II) Beck Anxiety Inventory (BAI) [place]  Multiphasic Personality Inventory-3 (MMPI-3) PTSD Checklist for DSM-5 (PCL-5)  Results: Test scores are relative to age, gender, and educational history as available and appropriate.  Measurement properties of test scores: IQ, Index, and Standard Scores (SS): Mean = 100; Standard Deviation = 15; Scaled Scores (ss): Mean =  10; Standard Deviation = 3; Z scores (Z): Mean = 0; Standard Deviation = 1; T scores (T); Mean = 50; Standard Deviation = 10  Performance Validity: The patient completed one stand-alone performance validity test and multiple measures with embedded performance validity metrics. Scores on the stand-alone test and embedded metrics were all within normal limits supporting interpretation of test data as a valid and reliable estimate of the patient's cognitive abilities.    Intellectual/Premorbid Functioning Estimate   Norm Score Percentile  Range  Wechsler Test of Adult Reading  SS = 122 93 %ile Above Average  Premorbid cognitive abilities are estimated to be average to high average overall based on the patient's history and performance on a word-reading/recognition measures.  ATTENTION AND WORKING MEMORY    Norm Score Percentile  Range  WAIS-IV          Digit Span  ss = 10 50 %ile Average   DSF  ss = 10 50 %ile Average   Span:    7      DSB  ss = 11 63 %ile Average   Span:    6      DSS  ss = 8 25 %ile Average   Span:    5     Auditory-verbal attention performances were within the average range overall and she performed consistently within the average range on component subtests. There was a slight score difference between the two subtests which placed higher demands on working-memory/mental manipulation.  Performance favored the subtest requiring her to restate digits in reverse order over the subtest  COMPLEX ATTENTION    Norm Score Percentile  Descriptor  CPT-3          Response Style       Balanced    Detectability (reverse scored)  t = 32 2 %ile Low (better than average)   Omission Errors  t = 45 14 %ile Average   Commission Errors  t = 37 3 %ile Low (better than average)   Perseverations  t = 46 38 %ile Average   HRT (reaction time)  t = 66 94 %ile Slow   HRT SD (reaction time consistency) t = 42 17 %ile Low (better than average)   Variability (in RT consistency)  t = 40 8 %ile Low  (better than average)   HRT Block Change (over test)  t = 42 20 %ile Low (better than average)   HRT ISI Change (by stimuli interval) t = 62 90 %ile Elevated  The patient completed a continuous performance task assessing for indications of impulsivity and aspects of complex attention. No indications of impulsivity / difficulties with response-inhibition were seen. Sustained attention metrics were within normal limits. Minor difficulties were seen on measures of vigilance involving reaction time with varied based on stimuli presentation rate (slower RT at longer inter-stimulus intervals).   PROCESSING SPEED    Norm Score Percentile Range  WAIS-IV          Coding  ss = 13 84 %ile High Average   Symbol Search  ss = 12 75 %ile High Average  Scores on measures of processing speed were consistently within the high average range.   PSYCHOMOTION    Norm Score Percentile  Range  Grooved Pegboard - Dominant  t = 41 18 %ile Low Average   # of drops    0     Grooved Pegboard - Nondominant  t = 47 37 %ile Average   # of drops    0     Speeded motor dexterity was low average for her dominant hand and average for her non-dominant hand, although this score difference was not statistically significant.    LANGUAGE    Norm Score Percentile  Range  NAB Naming  t = 54 66 %ile Average  Confrontation naming / word-retrieval was within normal limits by age and education.   EXECUTIVE FUNCTIONING    Norm Score Percentile  Range  DKEFS - Color-Word Interference          Color Naming  ss = 7 16 %ile Low Average   Word Reading  ss = 8 25 %ile Average   Inhibition  ss = 12 75 %ile High Average   Errors  ss = 10 50 %ile Average   Inhibition Switching  ss = 10 50 %ile Average   Errors  ss = 11 63 %ile Average  DKEFS - Trails          Condition 1  ss = 5 5 %ile Below Average   Condition II  ss = 10 50 %ile Average   Condition III  ss = 12 75 %ile High Average   Condition IV  ss = 12 75 %ile High Average    Condition V  ss = 12 75 %ile High Average  [place]  Card Sorting Test (WCST)          Total Errors  t = 48 42 %ile Average   Perseverative Errors  t =  45 30 %ile Average   Non-Perseverative Errors  t = 49 47 %ile Average   % Conceptual Responses  t = 47 39 %ile Average   Categories Completed     >16 %ile WNL   Trials to First Category Complete                  >16 %ile WNL   Failure to Maintain Set     >16 %ile WNL  The patient completed several measures assessing aspects of executive functioning. Her speed on a response-inhibition task was scored in the high average range (relative to age and to her baseline color-naming and word-reading speeds), and her accuracy was within the average range. Her speed and accuracy when a set-shifting element was added were both average by age.   On baseline metrics, the patient's visual scanning speed was scored in the below average (Trails, condition I) but high average in gross psychomotor speed (Trails condition IV). Her speed on number sequencing was average and letter sequencing was high average. She scored within the high average range on the combined sequencing+set-shifting trial.   The patient completed a measure of applied reasoning assessing problem solving, cognitive flexibility, and abstract concept formation. Her performance was intact by age on all metrics on this measure, showing no difficulties with any of the cognitive skills/abilities assessed.   MEMORY    Norm Score Percentile  Range  BVMT-R          Trial 1  t = 62.0 88 %ile High Average   Trial 2  t = 65 94 %ile Above Average   Trial 3  t = 61.0 86 %ile High Average   Total Recall  t = 64.0 92 %ile Above Average   Learning  t = 46.0 32 %ile Average   Delayed Recall  t = 63.0 90 %ile Above Average   % Retained    100 >16 %ile WNL   Hits     >16 %ile WNL   False Alarms     >16 %ile WNL   Recognition Discriminability     >16 %ile WNL  CVLT-III          Trial 1  ss = 8.0 25 %ile  Average   Trial 2  ss = 12.0 75 %ile High Average   Trial 3  ss = 12.0 75 %ile High Average   Trial 4  ss = 14.0 91 %ile Above Average   Trial 5  ss = 13.0 84 %ile High Average   Trial B  ss = 10.0 50 %ile Average   Short Delay Free Recall  ss = 13.0 84 %ile High Average   Short Delay Cued Recall  ss = 10.0 50 %ile Average   Long Delay Free Recall  ss = 12.0 75 %ile High Average   Long Delay Cued Recall  ss = 12.0 75 %ile High Average   Total Hits  ss = 13.0 84 %ile High Average   Total False Positives  ss = 13.0 84 %ile High Average   Recognition Discriminability  ss = 14.0 91 %ile Above Average   Total Intrusions  ss = 12.0 75 %ile High Average   Trials 1-5 Total Correct  SS = 110 75 %ile High Average   Total Repetitions  ss = 8.0 25 %ile Average   List B vs. Trial 1  ss = 11.0 63 %ile Average   SD (FR) vs. Trial 5 Correct  ss = 12.0 75 %  ile High Average   LD (FR)vs. SD (FR)  ss = 10.0 50 %ile Average  Wechsler Memory Scale, 4th Edition (WMS-4)         Log. Mem. Immediate Recall  ss = 6 9 %ile Low Average   Logical Memory Delayed Recall  ss = 7 16 %ile Low Average   Logical Recognition    26th-50th %ile Average  The patient completed one visual memory test and two auditory-verbal memory tests.  On the visually based memory test the patient scored within the high average to above average range with immediate recall on the three learning trials.  She scored above average with total information learned across those trials.  Her relative gains from repetition was technically average although this reflected her quickly reaching the upper score limit.  Following a delay she freely recalled all information initially learned and scored above average for total information recalled following a delay.  She made no errors and and reliably discriminated target versus nontarget figures on the delayed recognition task.    The patient scored similarly well on the a auditory-verbal memory test involving a  word list.  She showed slight difficulty on the initial learning trial with an average range score for immediate recall, but improved considerably and scored within the high average to above average range on the remaining 4 learning trials.  Her immediate recall with an interference list was average but consistent with her initial trial one encoding.  Her free recall following a short delay was scored in the high average range and her cued recall (slightly unexpectedly) was average.  The patient scored in the high average range in both free and cued recall following a long delay.  Her overall discriminability on the delayed recognition task was above average.  The patient's performance on the auditory-prose memory test involving two short stories was considerably weaker relative to other memory test scores.  She was low average for immediate recall and delayed free recall.  However per her recall normalized with cueing on the recognition task in which she scored in the average range.  VISUAL-SPATIAL     Percentile  Range  Benton JOLO     56 %ile Average  Rey Complex Figure Copy        <= 1 %ile Exceptionally Low  The patient's performance on a basic visual perception task involving mind orientation was scored in the average range.  Performance on a visual construction task involving copy of a complex geometric figure was technically scored in the exceptionally low score range.  The patient's figure showed no marked constructional impairments but rather indications of series of minor detail related errors that accumulated across the complex figure.  PERSONALITY AND BEHAVIORAL FUNCTIONING      Score/Interpretation  PCL-5 Raw       8  PCL-5 Desciptor       Within Normal Limits  BDI Raw       15  BDI Severity       Mild.  BAI Raw       16  BAI Severity       Moderate.  The patient self-report endorsements on a rating scale assessing for PTSD symptomology generated total score within normal limits.  Her  endorsements on a measure of depression related symptoms showed mild symptom severity.  Endorsements on a measure of anxiety showed moderately severe anxiety related symptoms, although this is likely artificially inflated to some degree due to physical health difficulties due to symptom overlap.  MMPI-3: The patient completed a comprehensive self-report inventory assessing emotional and behavioral functioning.  Symptom validity metrics were all within normal limits showing that she appeared to respond consistently to item content as instructed.  There were no indications of over or under reporting.  Her endorsements showed no significant elevations with emotional dysfunction and below average levels of anxiety. Endorsements noted reports of poor health and difficulties with fatigue. Endorsements showed a relatively diffuse pattern of cognitive difficulties. Individuals with this endorsement pattern may be more likely to experiencing subjective memory difficulties, reduced frustration tolerance, struggle with attention/concentration, and struggle managing stress.    SUMMARY / CLINICAL IMPRESSIONS The patient was referred for neuropsychological evaluation due to concerns for cognitive changes and sensory deficits, anxiety, and possible assessment for somatization/somatic symptom disorder. Per records, the patient established care on 04/14/23 with Dr. Emeline [facility] for treatment of bilateral lower extremity sensory loss that started 02/26/2023 on the right and then progressed to the left side. Aggressive workup was ordered. Per records from her 05/12/23 follow-up visit with [facility]; Ongoing work with PT was recommended given improvements in functional gains and there were additional medication adjustments. Neuropsychological evaluation was also ordered. Upon interview, the patient indicated she was pursuing the evaluation hoping to find an explanation for her symptoms. She indicated she was looking for any  kind of guidance or help, whether the issue is cognitive, emotional, or due to some other cause.   The patient described relatively mild cognitive difficulties primarily involving short term memory, processing speed, and attention/concentration. Cognitive difficulties largely began following birth of her son, with some variability including acute changes occurring alongside physical symptoms in early 2025. The patient describes significant frustration with her physical health symptoms and with the impact that those symptoms have had on her functioning. She reported some anxiety and mood related symptoms at present. She described some long-running tendencies towards anxiety. Depressive symptoms were described in terms suggestive of sub-clinical symptom severity that began sometime in adulthood. The patient has had multiple significant life events occurring in recent years, including loss of her mother in what most would consider traumatic circumstances. She denied active PTSD symptomology at present, but described indications of trauma related response when exposed to vivid reminders of things associated with the loss of her mother when working in [place]. The patient has never been formally diagnosed or treated for any mental health difficulties. The patient described difficulties with chronic pain and also with poor sleep. She reported significant difficulties with fatigue as well.   The patient's cognitive test profile showed performances in-line with expectations with the exception of some isolated instances of variability. There were minor indications of vigilance and inattention difficulties on a continuous performance test and slight variability between working memory subtests of auditory-verbal attention that could reflect variable attention. Similarly, the low score on one visual spatial task was secondary to minor attention to detail errors.The low scores on executive functioning measures were limited to  baseline trials (Color Naming, Word Reading, Trails Condition I), and her performance was strong on the primary metrics, ones which were objectively more difficult, suggesting minor engagement and vigilance issues as the likely driver of the low scores. There was some surprising variability in memory testing, but follow-up questioning during the feedback suggested this was likely secondary to anxiety rather than genuine impairments. The patient's performances on measures of processing speed and on two of the three memory tests were high average or better overall. Executive functioning measures and applied  reasoning showed no deficits on primary metrics. Naming was intact by age and education, and variability on speeded motor dexterity was within normal limits. Overall, the patient's cognitive test data showed no clear indications of neurocognitive impairments. Variable performance are likely secondary to premorbid cognitive tendencies or interference from anxiety.   The patient's endorsements on self-report measures showed mild depressive symptom severity but moderate anxiety related symptom severity. The patient completed a comprehensive self-report measure (MMPI-3). While the validity scores on this measure were within normal limits, the results were relatively inconsistent with subjective symptom reports (I.e., MMPI-3 showed no mood difficulties and below average anxiety related symptoms) and, based on follow up discussion with the patient during the feedback appointment, likely reflect a degree of under-reporting. Anxiety appear to be present to some degree, which would be understandable given the level of life changes and difficulties described by the patient.    Following completion of the feedback appointment, the patient reached out to share news regarding findings from follow-up with a vascular specialist which provided clarity regarding some of the physical symptoms she was experiencing (May-Thurner  syndrome). While the condition itself is not a direct cause of anxiety, uncertainty around cause of symptoms and difficulty finding a diagnosis could easily be stressful and generate feelings of anxiety. Hope[place], the patient experiences some relief in this respect. In terms of cognitive symptoms, test scores do not show clear indications of neurological impairment. The areas of variability and possible vigilance related difficulties on testing are not compelling indications of acquired injury given the very strong cognitive performances overall, and subtle interference from anxiety can account for this. Performance on the prose memory test was itself secondary to anxiety related interference. Overall, results are reassuring.   Given recent identification of the cause of the patient's physical health difficulties and the journey to reach that diagnosis, I would not be comfortable making a somatic symptom disorder diagnosis. As discussed with the patient during feedback, I do have some concerns about anxiety and ways in which this appears to interact with behavior and health. Again, I do not feel a somatic symptom disorder is appropriate within the current context, but based on discussion during the feedback, individual therapy may be beneficial. Beyond anxiety, the patient is in a notable period of transition in life and it can be helpful to have psychotherapeutic support during this time.   Diagnosis: No formal cognitive diagnosis.  Anxiety, unspecified. (Per records and consistent with current evaluation)  Recommendations:  No formal cognitive or somatic symptom diagnosis at this time.  As discussed during feedback. Individual therapy may be beneficial in several respects. Www.psychologytoday.com has a large database of providers that can be filtered by multiple criteria (location, insurance, specialty). No referral is needed.  I encourage the patient to continue working to increase activity levels  overall as this is beneficial for both mood and anxiety.  The patient described some sleep difficulties. These can negatively impact cognitive functioning and exacerbate mood and anxiety, which in turn can increase distress from physical discomfort related to health conditions. I've included a sleep-hygiene recommendations handout which may be beneficial in this respect along with the copy of the report mailed to the patient. Please reach out if any new questions emerge upon receipt of this report. It was a pleasure meeting with the patient.               Evalene DOROTHA Riff, PsyD             Neuropsychologist  This report was generated using voice recognition software. While this document has been carefully reviewed, transcription errors may be present. I apologize in advance for any inconvenience. Please contact me if further clarification is needed.

## 2023-09-25 NOTE — Progress Notes (Signed)
 Office Visit Note  Patient: [name]             Date of Birth: 1986-01-17           MRN: [id number]             PCP: Gil Greig BRAVO, NP Referring: Gil Greig BRAVO, NP Visit Date: 10/09/2023 Occupation: @GUAROCC @  Subjective:  Fatigue and positive ANA  History of Present Illness: [name] is a 38 y.o. female seen for the evaluation of positive ANA.  According the patient she has always had food allergies since childhood.  Now she is developing new food allergies.  She was evaluated by the [facility] who obtained some labs and her ANA was positive for that reason she was referred to me.  She gives history of intermittent facial rash and fatigue.  There is no history of oral ulcers, nasal ulcers, malar rash, photosensitivity, Raynaud's or lymphadenopathy.  She gives history of sicca symptoms.  She states she has also had palpitations in the past which was evaluated by cardiology and the cardiology workup was negative.  She states in 2022 she delivered her son and had fourth degree perineal tear which was repaired.  After that she started having lower extremity pain and lower extremity swelling which continued for a while.  She states she was unable to lose weight.  6 months after the childbirth she developed COVID-19 virus infection and suffered with the symptoms for a while and then recovered completely.  She states she continued to see her PCP and GYN for the lower extremity pain and paresthesias but no answers were given.  She states in January 2025 her paresthesias got worse to the point she noticed right foot drop.  She was evaluated in the emergency room and also later by Dr. Onita.  She had extensive workup including EMG, nerve conduction and MRI which were all negative.  She was discharged from neurology clinic.  She states she went to see a vascular surgeon at [facility] who did ultrasound of the lower extremities which showed chronic venous insufficiency.  She had a venogram 2 weeks ago.  She  had a televisit with the vascular surgeon yesterday.  She was told that she had May-Thurner syndrome.  She also had female pelvic congestion syndrome.  She states she will require bilateral iliac venous stents. She works part-time as a Acupuncturist as a Probation officer.  She states previously she was working full-time but due to her lower extremity discomfort she could not continue to work full-time.  She also writes music and sings.  She enjoys reading.  She does yoga and Zumba as her health permits. She is right-handed, she lives with her significant other.  Birth control method is condoms.  She has not started oral contraceptive pill yet.  She is gravida 1, para 1.  She drinks alcohol only occasionally and never been a smoker. There is family history of rheumatoid arthritis and fibromyalgia syndrome in her mother.    Activities of Daily Living:  Patient reports morning stiffness for 30-60 minutes.   Patient Reports nocturnal pain.  Difficulty dressing/grooming: Denies Difficulty climbing stairs: Denies Difficulty getting out of chair: Denies Difficulty using hands for taps, buttons, cutlery, and/or writing: Denies  Review of Systems  Constitutional:  Positive for fatigue.  HENT:  Positive for mouth dryness. Negative for mouth sores.   Eyes:  Positive for photophobia and dryness.  Respiratory:  Negative for difficulty breathing.   Cardiovascular:  Positive  for palpitations. Negative for chest pain.  Gastrointestinal:  Positive for constipation. Negative for blood in stool and diarrhea.  Endocrine: Positive for increased urination.  Genitourinary:  Negative for involuntary urination.  Musculoskeletal:  Positive for joint pain, gait problem, joint pain, myalgias, muscle weakness, morning stiffness, muscle tenderness and myalgias. Negative for joint swelling.  Skin:  Negative for color change, rash, hair loss and sensitivity to sunlight.  Allergic/Immunologic: Positive for  susceptible to infections.  Neurological:  Negative for dizziness and headaches.  Hematological:  Negative for swollen glands.  Psychiatric/Behavioral:  Positive for depressed mood and sleep disturbance. The patient is nervous/anxious.     PMFS History:  Patient Active Problem List   Diagnosis Date Noted   Constipation 07/15/2023   Persistent depressive disorder 05/12/2023   Cognitive changes 05/12/2023   Fibroids 04/21/2023   Urinary incontinence 04/14/2023   Generalized edema 04/14/2023   Sensory loss 04/14/2023   Gait abnormality 03/03/2023   Paresthesia 03/03/2023   Seasonal and perennial allergic rhinitis 12/30/2022   Seasonal allergic conjunctivitis 12/30/2022   Poor sleep 07/25/2022   Weight gain 07/25/2022   Pure hypercholesterolemia 03/20/2022   Generalized obesity with starting BMI 32 03/06/2022   Low back pain 03/06/2022   Vitamin D  deficiency 03/06/2022   SOBOE (shortness of breath on exertion) 03/06/2022   Other fatigue 03/06/2022   Anxiety 07/31/2021   Gastroesophageal reflux disease without esophagitis 07/31/2021   Genital herpes simplex 07/31/2021   History of abnormal cervical Papanicolaou smear 07/31/2021   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   Subclinical hyperthyroidism 07/31/2021   Twin pregnancy 07/31/2021   Polycystic ovary syndrome 07/31/2021   Hashimoto's disease 05/21/2021   Thyroid  antibody positive 01/24/2021   Vitiligo 01/24/2021   Pregnancy 02/29/2020   Vaginal tear resulting from childbirth 02/29/2020   Hyperandrogenemia 02/04/2012   PCOS (polycystic ovarian syndrome) 02/04/2012    Past Medical History:  Diagnosis Date   Anemia    Anxiety    Back pain    Constipation    Hashimoto's disease    History of chicken pox    HSV infection    Joint pain    Lactose intolerance    Lower back pain    Palpitations    PCOS (polycystic ovarian syndrome)    PCOS (polycystic ovarian syndrome)    Right hip pain    SOBOE (shortness of breath on  exertion)    Thyroid  condition    Velamentous insertion of umbilical cord    Vitamin D  deficiency    Vitiligo     Family History  Problem Relation Age of Onset   Allergic rhinitis Mother    Urticaria Mother    Asthma Mother    Thyroid  disease Mother    Hypertension Mother    Cancer Mother        Cervical Cancer   Heart disease Mother    Depression Mother    Anxiety disorder Mother    Obesity Mother    Other Mother        colon ischemia   Hypertension Father    Hepatitis C Father    Allergic rhinitis Brother    Asthma Brother    Vitiligo Brother    Allergic rhinitis Brother    Urticaria Brother    Allergic rhinitis Brother    Healthy Brother    Healthy Brother    Breast cancer Maternal Aunt 50   Thyroid  disease Maternal Aunt    Ovarian cancer Paternal Aunt 50   Thyroid  disease Maternal Grandmother  Allergic rhinitis Son    Eczema Son    Stroke Neg Hx    Hyperlipidemia Neg Hx    Early death Neg Hx    Past Surgical History:  Procedure Laterality Date   PERINEAL LACERATION REPAIR N/A 03/01/2020   Procedure: SUTURE REPAIR PERINEAL LACERATION;  Surgeon: Marget Lenis, MD;  Location: [facility];  Service: Gynecology;  Laterality: N/A;   VENOGRAM  09/27/2023   Social History   Social History Narrative   Regular exercise-yes Caffeine Use-yes   Immunization History  Administered Date(s) Administered   HPV 9-valent 07/25/2023   Influenza-Unspecified 12/10/2019   Moderna Covid-19 Vaccine Bivalent Booster 6yrs & up 11/29/2019   Moderna Sars-Covid-2 Vaccination 03/09/2019, 04/06/2019   Tdap 12/06/2019     Objective: Vital Signs: BP 118/82 (BP Location: Right Arm, Patient Position: Sitting, Cuff Size: Normal)   Pulse 81   Resp 16   Ht 5' 3 (1.6 m)   Wt 193 lb (87.5 kg)   BMI 34.19 kg/m    Physical Exam Vitals and nursing note reviewed.  Constitutional:      Appearance: She is well-developed.  HENT:     Head: Normocephalic and atraumatic.  Eyes:      Conjunctiva/sclera: Conjunctivae normal.  Cardiovascular:     Rate and Rhythm: Normal rate and regular rhythm.     Heart sounds: Normal heart sounds.  Pulmonary:     Effort: Pulmonary effort is normal.     Breath sounds: Normal breath sounds.  Abdominal:     General: Bowel sounds are normal.     Palpations: Abdomen is soft.  Musculoskeletal:     Cervical back: Normal range of motion.  Lymphadenopathy:     Cervical: No cervical adenopathy.  Skin:    General: Skin is warm and dry.     Capillary Refill: Capillary refill takes less than 2 seconds.  Neurological:     Mental Status: She is alert and oriented to person, place, and time.  Psychiatric:        Behavior: Behavior normal.      Musculoskeletal Exam: Cervical, thoracic and lumbar spine Juengel range of motion.  She had no SI joint tenderness.  Shoulders, elbow joints, wrist joints, MCPs PIPs and DIPs were in good range of motion with no synovitis.  Hip joints and knee joints in good range of motion without any warmth swelling or effusion.  There was no tenderness over ankles or MTPs.  There was no plantar fasciitis or Achilles tendinitis.  CDAI Exam: CDAI Score: -- Patient Global: --; Provider Global: -- Swollen: --; Tender: -- Joint Exam 10/09/2023   No joint exam has been documented for this visit   There is currently no information documented on the homunculus. Go to the Rheumatology activity and complete the homunculus joint exam.  Investigation: No additional findings.  Imaging: No results found.  Recent Labs: Lab Results  Component Value Date   WBC 8.7 03/18/2023   HGB 13.6 03/18/2023   PLT 231 03/18/2023   NA 140 03/11/2023   K 3.7 03/11/2023   CL 101 03/11/2023   CO2 24 03/11/2023   GLUCOSE 79 03/11/2023   BUN 12 03/11/2023   CREATININE 0.78 03/11/2023   BILITOT 0.2 03/11/2023   ALKPHOS 49 03/11/2023   AST 14 03/11/2023   ALT 12 03/11/2023   PROT 7.0 03/11/2023   ALBUMIN 4.4 03/11/2023   CALCIUM   9.5 03/11/2023   July 10, 2023 ANA 1: 80 NS, ESR 12, CRP<1.0 July 23, 2023  TSH normal  September 09, 2023 CBC 5.2, hemoglobin 13.5, platelets 252, BMP creatinine 0.72, calcium  8.9  Speciality Comments: No specialty comments available.  Procedures:  No procedures performed Allergies: Nickel, Cherry, Banana, and Spinach   Assessment / Plan:     Visit Diagnoses: Positive ANA (antinuclear antibody) -she has low titer positive ANA.  She gives history of fatigue, sicca symptoms, lower back pain and hip joint pain.  There is no history of oral ulcers, nasal ulcers, malar rash, photosensitivity, Raynaud's or lymphadenopathy.  I will obtain additional labs today.  Plan: Protein / creatinine ratio, urine, ANA, Anti-scleroderma antibody, RNP Antibody, Anti-Smith antibody, Sjogrens syndrome-A extractable nuclear antibody, Sjogrens syndrome-B extractable nuclear antibody, Anti-DNA antibody, double-stranded, C3 and C4, Beta-2 glycoprotein antibodies, Cardiolipin antibodies, IgG, IgM, IgA, Rheumatoid factor, Cyclic citrul peptide antibody, IgG, CK  Scalp psoriasis-she has had scalp psoriasis since she was 38 years old.  No active psoriasis lesions were noted today.  She has been using topical agents as needed.  Chronic pain of both hips-she complains of hip pain.  She had good range of motion of bilateral hips.  She had some tenderness over trochanteric region.  Chronic midline low back pain without sciatica-she complains of chronic lower back pain. She was evaluated by Dr. [name].  I reviewed MRI of her thoracic and lumbar spine.  Findings were discussed with the patient.  She had MRI of the thoracic and lumbar spine in April 2025 which were reviewed to date.  Her lumbar spine showed mild degenerative changes.  May-Thurner syndrome-recent diagnosis established by her vascular surgeon at [facility].  Should be getting bilateral iliac vein stents.  Her records from [facility] were reviewed.  Female pelvic congestion  syndrome-most likely related to May-Thurner syndrome.  Chronic pelvic pain in female-patient gives history of pelvic pain since the childbirth in 2022.  Other fatigue - Plan: CK  Hashimoto's thyroiditis-states she had TPO antibodies.  She recently started taking Synthroid .  Which may help her fatigue as well.  Association of positive ANA with thyroiditis was also discussed.  Other medical problems are listed as follows:  Vitiligo  Pure hypercholesterolemia  Gastroesophageal reflux disease without esophagitis  Vitamin D  deficiency  Seasonal and perennial allergic rhinitis  Paresthesia  Anxiety  Persistent depressive disorder  Poor sleep  Hyperandrogenemia  PCOS (polycystic ovarian syndrome)  Genital herpes simplex, unspecified site  Generalized obesity with starting BMI 32  Family history of rheumatoid arthritis-mother  Family history of fibromyalgia-mother    Orders: Orders Placed This Encounter  Procedures   Protein / creatinine ratio, urine   ANA   Anti-scleroderma antibody   RNP Antibody   Anti-Smith antibody   Sjogrens syndrome-A extractable nuclear antibody   Sjogrens syndrome-B extractable nuclear antibody   Anti-DNA antibody, double-stranded   C3 and C4   Beta-2 glycoprotein antibodies   Cardiolipin antibodies, IgG, IgM, IgA   Rheumatoid factor   Cyclic citrul peptide antibody, IgG   CK   No orders of the defined types were placed in this encounter.   Face-to-face time spent with patient was over 45 minutes. Greater than 50% of time was spent in counseling and coordination of care.  Follow-Up Instructions: Return for  positive ANA.   Maya Nash, MD  Note - This record has been created using Animal nutritionist.  Chart creation errors have been sought, but may not always  have been located. Such creation errors do not reflect on  the standard of medical care.

## 2023-09-27 HISTORY — PX: VENOGRAM: SHX6165

## 2023-09-30 LAB — TSH: TSH: 2.38 u[IU]/mL (ref 0.450–4.500)

## 2023-09-30 LAB — T4, FREE: Free T4: 1.08 ng/dL (ref 0.82–1.77)

## 2023-10-02 NOTE — Progress Notes (Signed)
   NEUROPSYCHOLOGICAL EVALUATION [place]  Physical Medicine and Rehabilitation     Patient: [name]  MRN: [id number] DOB: 03-17-1985   Service Provider/Clinical Neuropsychologist: Evalene DOROTHA Riff, PsyD  Date of Service: 08/22/23 Start Time: 11 AM End Time: 12 PM  Location of Service:  [facility] Physical Medicine & Rehabilitation Department [place] [address], [address] [place], [place] [address] Phone: [phone]   Billing Code/Service: [phone]    Individuals present: Patient, Provider Laurier DOROTHA Riff, PsyD)  Provider conducted the 60-minute interactive feedback appointment in-person with the patient.  The provider reviewed and discussed the results of neuropsychological evaluation. Follow-up interviewing was conducted as needed to refine interpretation of findings as needed. Review of results included overall findings, diagnosis, and treatment planning/recommendations that were derived from integration of patient data, interpretation of standardized rest results and clinical data, and clinical decision making, which are documented in the patient's electronic medical record with the full report (date listed below). A copy of the full report will also be mailed to the patient.   The patient expressed understanding of the information reviewed. The patient was provided opportunity to ask questions which were then answered by the provider. The provider worked collaboratively to tailor treatment recommendations to the patient when possible. The patient was informed they could reach out to the provider should additional questions related to the evaluation arise.    The final neuropsychological evaluation report, documented in the patient's chart (DATE 07/18/23), was amended to reflect any additional information obtained during the feedback appointment including treatment planning collaboration.    This report was generated using  voice recognition software. While this document has been carefully reviewed, transcription errors may be present. I apologize in advance for any inconvenience. Please contact me if further clarification is needed.             Evalene DOROTHA Riff, PsyD             Neuropsychologist

## 2023-10-09 ENCOUNTER — Encounter: Payer: Self-pay | Admitting: Rheumatology

## 2023-10-09 ENCOUNTER — Ambulatory Visit: Attending: Rheumatology | Admitting: Rheumatology

## 2023-10-09 VITALS — BP 118/82 | HR 81 | Resp 16 | Ht 63.0 in | Wt 193.0 lb

## 2023-10-09 DIAGNOSIS — K219 Gastro-esophageal reflux disease without esophagitis: Secondary | ICD-10-CM | POA: Diagnosis present

## 2023-10-09 DIAGNOSIS — E669 Obesity, unspecified: Secondary | ICD-10-CM | POA: Insufficient documentation

## 2023-10-09 DIAGNOSIS — R768 Other specified abnormal immunological findings in serum: Secondary | ICD-10-CM | POA: Insufficient documentation

## 2023-10-09 DIAGNOSIS — E7800 Pure hypercholesterolemia, unspecified: Secondary | ICD-10-CM | POA: Insufficient documentation

## 2023-10-09 DIAGNOSIS — M25551 Pain in right hip: Secondary | ICD-10-CM | POA: Diagnosis not present

## 2023-10-09 DIAGNOSIS — R202 Paresthesia of skin: Secondary | ICD-10-CM | POA: Insufficient documentation

## 2023-10-09 DIAGNOSIS — E282 Polycystic ovarian syndrome: Secondary | ICD-10-CM | POA: Diagnosis present

## 2023-10-09 DIAGNOSIS — I871 Compression of vein: Secondary | ICD-10-CM | POA: Insufficient documentation

## 2023-10-09 DIAGNOSIS — Z8261 Family history of arthritis: Secondary | ICD-10-CM | POA: Diagnosis present

## 2023-10-09 DIAGNOSIS — J302 Other seasonal allergic rhinitis: Secondary | ICD-10-CM | POA: Insufficient documentation

## 2023-10-09 DIAGNOSIS — N9489 Other specified conditions associated with female genital organs and menstrual cycle: Secondary | ICD-10-CM | POA: Diagnosis present

## 2023-10-09 DIAGNOSIS — Z72820 Sleep deprivation: Secondary | ICD-10-CM | POA: Insufficient documentation

## 2023-10-09 DIAGNOSIS — G8929 Other chronic pain: Secondary | ICD-10-CM | POA: Diagnosis present

## 2023-10-09 DIAGNOSIS — E063 Autoimmune thyroiditis: Secondary | ICD-10-CM | POA: Diagnosis present

## 2023-10-09 DIAGNOSIS — L80 Vitiligo: Secondary | ICD-10-CM | POA: Insufficient documentation

## 2023-10-09 DIAGNOSIS — F419 Anxiety disorder, unspecified: Secondary | ICD-10-CM | POA: Insufficient documentation

## 2023-10-09 DIAGNOSIS — J3089 Other allergic rhinitis: Secondary | ICD-10-CM | POA: Diagnosis present

## 2023-10-09 DIAGNOSIS — R102 Pelvic and perineal pain: Secondary | ICD-10-CM | POA: Insufficient documentation

## 2023-10-09 DIAGNOSIS — E559 Vitamin D deficiency, unspecified: Secondary | ICD-10-CM | POA: Diagnosis present

## 2023-10-09 DIAGNOSIS — R5383 Other fatigue: Secondary | ICD-10-CM | POA: Diagnosis present

## 2023-10-09 DIAGNOSIS — F341 Dysthymic disorder: Secondary | ICD-10-CM | POA: Diagnosis present

## 2023-10-09 DIAGNOSIS — A6000 Herpesviral infection of urogenital system, unspecified: Secondary | ICD-10-CM | POA: Insufficient documentation

## 2023-10-09 DIAGNOSIS — Z8269 Family history of other diseases of the musculoskeletal system and connective tissue: Secondary | ICD-10-CM | POA: Diagnosis present

## 2023-10-09 DIAGNOSIS — E281 Androgen excess: Secondary | ICD-10-CM | POA: Insufficient documentation

## 2023-10-09 DIAGNOSIS — M25552 Pain in left hip: Secondary | ICD-10-CM | POA: Insufficient documentation

## 2023-10-09 DIAGNOSIS — M545 Low back pain, unspecified: Secondary | ICD-10-CM | POA: Insufficient documentation

## 2023-10-09 DIAGNOSIS — L409 Psoriasis, unspecified: Secondary | ICD-10-CM | POA: Diagnosis not present

## 2023-10-11 LAB — PROTEIN / CREATININE RATIO, URINE
Creatinine, Urine: 41 mg/dL (ref 20–275)
Total Protein, Urine: <4 mg/dL — ABNORMAL LOW (ref 5–24)

## 2023-10-13 ENCOUNTER — Ambulatory Visit: Payer: Self-pay | Admitting: Rheumatology

## 2023-10-13 LAB — ANA: Anti Nuclear Antibody (ANA): NEGATIVE

## 2023-10-13 LAB — CARDIOLIPIN ANTIBODIES, IGG, IGM, IGA
Anticardiolipin IgA: <2 [APL'U]/mL (ref <20.0)
Anticardiolipin IgG: <2 [GPL'U]/mL (ref <20.0)
Anticardiolipin IgM: <2 [MPL'U]/mL (ref <20.0)

## 2023-10-13 LAB — BETA-2 GLYCOPROTEIN ANTIBODIES
Beta-2 Glyco 1 IgA: <2 U/mL (ref <20.0)
Beta-2 Glyco 1 IgM: <2 U/mL (ref <20.0)
Beta-2 Glyco I IgG: <2 U/mL (ref <20.0)

## 2023-10-13 LAB — SJOGRENS SYNDROME-A EXTRACTABLE NUCLEAR ANTIBODY: SSA (Ro) (ENA) Antibody, IgG: <1 AI

## 2023-10-13 LAB — C3 AND C4
C3 Complement: 111 mg/dL (ref 83–193)
C4 Complement: 26 mg/dL (ref 15–57)

## 2023-10-13 LAB — ANTI-SCLERODERMA ANTIBODY: Scleroderma (Scl-70) (ENA) Antibody, IgG: <1 AI

## 2023-10-13 LAB — CYCLIC CITRUL PEPTIDE ANTIBODY, IGG: Cyclic Citrullin Peptide Ab: <16 U

## 2023-10-13 LAB — ANTI-DNA ANTIBODY, DOUBLE-STRANDED: ds DNA Ab: <1 [IU]/mL

## 2023-10-13 LAB — RHEUMATOID FACTOR: Rheumatoid fact SerPl-aCnc: <10 [IU]/mL (ref <14)

## 2023-10-13 LAB — SJOGRENS SYNDROME-B EXTRACTABLE NUCLEAR ANTIBODY: SSB (La) (ENA) Antibody, IgG: <1 AI

## 2023-10-13 LAB — ANTI-SMITH ANTIBODY: ENA SM Ab Ser-aCnc: <1 AI

## 2023-10-13 LAB — RNP ANTIBODY: Ribonucleic Protein(ENA) Antibody, IgG: 1.8 AI — AB

## 2023-10-13 LAB — CK: Total CK: 49 U/L (ref 20–239)

## 2023-10-13 NOTE — Progress Notes (Signed)
 RNP antibodies positive.  All other labs are within normal limits.  I will discuss results at the follow-up visit.

## 2023-10-14 ENCOUNTER — Ambulatory Visit (INDEPENDENT_AMBULATORY_CARE_PROVIDER_SITE_OTHER): Admitting: Allergy & Immunology

## 2023-10-14 ENCOUNTER — Other Ambulatory Visit: Payer: Self-pay

## 2023-10-14 ENCOUNTER — Encounter: Payer: Self-pay | Admitting: Allergy & Immunology

## 2023-10-14 VITALS — BP 98/72 | HR 80 | Temp 98.4°F | Resp 18 | Ht 63.0 in | Wt 193.3 lb

## 2023-10-14 DIAGNOSIS — L272 Dermatitis due to ingested food: Secondary | ICD-10-CM

## 2023-10-14 DIAGNOSIS — J3089 Other allergic rhinitis: Secondary | ICD-10-CM | POA: Diagnosis not present

## 2023-10-14 DIAGNOSIS — J302 Other seasonal allergic rhinitis: Secondary | ICD-10-CM | POA: Diagnosis not present

## 2023-10-14 NOTE — Patient Instructions (Addendum)
 Perennial and seasoning allergic rhinitis (grasses, weeds, trees, molds, dust mites, cat, dog, cockroach) - Allergen avoidance measures are listed below - Continue with antihistamine once a day as needed for runny nose or itch. Remember to rotate to a different antihistamine about every 3 months.  - Continue Flonase 1 spray in each nostril once a day as needed for stuffy nose. In the right nostril, point the applicator out toward the right ear. In the left nostril, point the applicator out toward the left ear Consider saline nasal rinses as needed for nasal symptoms. Use this before any medicated nasal sprays for best result  2. Food intolerances versus allergies  - Continue to avoid all of your triggering foods.  - We are going to get a serum tryptase to see if this is related to a mast cell disorder.  - We can do more extensive testing for mast cell activation syndrome. - I would continue with the Pepcid at night as needed and cetirizine in the morning as needed.   3. Concern for metal allergy  - We will get you scheduled for metal patch testing, which will cover both titanium and nickel.  - These are placed on a Monday and then read on a Wednesday and Friday and then a Monday.  - Make this on your way out.    4. Return in about 1 year (around 10/13/2024) for a regular office visit. Make an appointment for metal patch testing and reading, next available.    Please inform us  of any Emergency Department visits, hospitalizations, or changes in symptoms. Call us  before going to the ED for breathing or allergy  symptoms since we might be able to fit you in for a sick visit. Feel free to contact us  anytime with any questions, problems, or concerns.  It was a pleasure to see you again today!  Websites that have reliable patient information: 1. American Academy of Asthma, Allergy , and Immunology: www.aaaai.org 2. Food Allergy  Research and Education (FARE): foodallergy.org 3. Mothers of Asthmatics:  http://www.asthmacommunitynetwork.org 4. [employer] of Allergy , Asthma, and Immunology: www.acaai.org      "Like" us  on Facebook and Instagram for our latest updates!      A healthy democracy works best when [employer] participate! Make sure you are registered to vote! If you have moved or changed any of your contact information, you will need to get this updated before voting! Scan the QR codes below to learn more!

## 2023-10-14 NOTE — Progress Notes (Signed)
 FOLLOW UP  Date of Service/Encounter:  10/14/23   Assessment:   Perennial and seasoning allergic rhinitis (grasses, weeds, trees, molds, dust mites, cat, dog, cockroach)  Food intolerance versus allergies - getting testing to tomato, avocado, and cherry    Complicated past medical history including new onset neuropathy  May-Turner syndrome - going to have stents placed in the near future (has history of contact dermatitis to certain ear rings)  Plan/Recommendations:   Perennial and seasoning allergic rhinitis (grasses, weeds, trees, molds, dust mites, cat, dog, cockroach) - Allergen avoidance measures are listed below - Continue with antihistamine once a day as needed for runny nose or itch. Remember to rotate to a different antihistamine about every 3 months.  - Continue Flonase 1 spray in each nostril once a day as needed for stuffy nose. In the right nostril, point the applicator out toward the right ear. In the left nostril, point the applicator out toward the left ear Consider saline nasal rinses as needed for nasal symptoms. Use this before any medicated nasal sprays for best result  2. Food intolerances versus allergies  - Continue to avoid all of your triggering foods.  - We are going to get a serum tryptase to see if this is related to a mast cell disorder.  - We can do more extensive testing for mast cell activation syndrome. - I would continue with the Pepcid at night as needed and cetirizine in the morning as needed.   3. Concern for metal allergy  - We will get you scheduled for metal patch testing, which will cover both titanium and nickel.  - These are placed on a Monday and then read on a Wednesday and Friday and then a Monday.  - Make this on your way out.    4. Return in about 1 year (around 10/13/2024) for a regular office visit. Make an appointment for metal patch testing and reading, next available.    Subjective:   [name] is a 38 y.o. female  presenting today for follow up of  Chief Complaint  Patient presents with   Urticaria    Patient is here for a follow up. She states that she needs a patch test done for a surgery. She has to have stents placed in two of her iliac veins.    [name] has a history of the following: Patient Active Problem List   Diagnosis Date Noted   Constipation 07/15/2023   Persistent depressive disorder 05/12/2023   Cognitive changes 05/12/2023   Fibroids 04/21/2023   Urinary incontinence 04/14/2023   Generalized edema 04/14/2023   Sensory loss 04/14/2023   Gait abnormality 03/03/2023   Paresthesia 03/03/2023   Seasonal and perennial allergic rhinitis 12/30/2022   Seasonal allergic conjunctivitis 12/30/2022   Poor sleep 07/25/2022   Weight gain 07/25/2022   Pure hypercholesterolemia 03/20/2022   Generalized obesity with starting BMI 32 03/06/2022   Low back pain 03/06/2022   Vitamin D  deficiency 03/06/2022   SOBOE (shortness of breath on exertion) 03/06/2022   Other fatigue 03/06/2022   Anxiety 07/31/2021   Gastroesophageal reflux disease without esophagitis 07/31/2021   Genital herpes simplex 07/31/2021   History of abnormal cervical Papanicolaou smear 07/31/2021   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  Discussed the use of AI scribe software for clinical note transcription with the patient and/or guardian, who gave verbal consent to proceed.  [name] is a 38 y.o. female presenting for a follow up visit.  She was last seen in June 2025.  At that time, we continued with Flonase as well  as nasal saline rinses.  We also continued with an antihistamine as needed.  We talked about allergy  shots for long-term control.  She continued to avoid tomato, avocado, and cherry.  Tryptase was normal.  Cherry IgE was mildly elevated, but tomato, avocado, milk, and wheat were all normal.  We also looked for rye and barley which were normal.  We continue with Pepcid in the morning and cetirizine at night.  Since last visit, she has mostly done well.   She has vascular compressions, specifically May-Thurner syndrome, where her iliac artery compresses her iliac vein, leading to impaired venous return and leg swelling. She is scheduled to have stents placed due to significant narrowing, with the right side being 70% closed.  She is under evaluation by a rheumatologist for a possible connective tissue disorder. Previous lab work showed a barely positive ANA and an abnormal RNP, but her ANA was negative on a subsequent test. She has not heard what [name] thinks of these labs, but she awaiting a call about this.   She is here to discuss a patch test for a nickel titanium alloy, which will be used in her stents. She has a history of sensitivity to metals, particularly with earrings, causing redness and itching. She has not had any problems with any other jewelry. She also reacted to a copper IUD. She is scheduled for a patch test on September 22nd already.   She has environmental allergies that are currently well-controlled and not causing significant issues. She has a known food allergy  to cherries and avoids purple kale due to previous adverse reactions. She has experienced bloating and gas after consuming green kale. She is going to just avoid kale completely.   Environmental allergies are under good control. She is not using her medications daily.   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 She has not  been taking medications like Pepcid and gabapentin  recently, as she wanted to stop them while undergoing blood work.     Otherwise, there have been no changes to her past medical history, surgical history, family history, or social history.    Review of systems otherwise negative other than that mentioned in the HPI.    Objective:   Blood pressure 98/72, pulse 80, temperature 98.4 F (36.9 C), temperature source Temporal, resp. rate 18, height 5' 3 (1.6 m), weight 193 lb 4.8 oz (87.7 kg), SpO2 97%, unknown if currently breastfeeding. Body mass index is 34.24 kg/m.    Physical Exam Vitals reviewed.  Constitutional:      Appearance: She is well-developed.  HENT:     Head: Normocephalic and atraumatic.     Right Ear: Tympanic membrane, ear canal and external ear normal. No drainage, swelling or tenderness. Tympanic membrane is not injected, scarred, erythematous, retracted or bulging.     Left Ear: Tympanic membrane, ear canal and external ear normal. No drainage, swelling or tenderness. Tympanic membrane is not injected, scarred, erythematous, retracted or bulging.  Nose: No nasal deformity, septal deviation, mucosal edema or rhinorrhea.     Right Turbinates: Enlarged, swollen and pale.     Left Turbinates: Enlarged, swollen and pale.     Right Sinus: No maxillary sinus tenderness or frontal sinus tenderness.     Left Sinus: No maxillary sinus tenderness or frontal sinus tenderness.     Comments: No polyps noted.     Mouth/Throat:     Mouth: Mucous membranes are not pale and not dry.     Pharynx: Uvula midline.  Eyes:     General:        Right eye: No discharge.        Left eye: No discharge.     Conjunctiva/sclera: Conjunctivae normal.     Right eye: Right conjunctiva is not injected. No chemosis.    Left eye: Left conjunctiva is not injected. No chemosis.    Pupils: Pupils are equal, round, and reactive to light.  Cardiovascular:     Rate and Rhythm: Normal rate and  regular rhythm.     Heart sounds: Normal heart sounds.  Pulmonary:     Effort: Pulmonary effort is normal. No tachypnea, accessory muscle usage or respiratory distress.     Breath sounds: Normal breath sounds. No wheezing, rhonchi or rales.     Comments: Moving air well in all lung fields.  No increased work of breathing. Chest:     Chest wall: No tenderness.  Abdominal:     Tenderness: There is no abdominal tenderness. There is no guarding or rebound.  Lymphadenopathy:     Head:     Right side of head: No submandibular, tonsillar or occipital adenopathy.     Left side of head: No submandibular, tonsillar or occipital adenopathy.     Cervical: No cervical adenopathy.  Skin:    Coloration: Skin is not pale.     Findings: No abrasion, erythema, petechiae or rash. Rash is not papular, urticarial or vesicular.  Neurological:     Mental Status: She is alert.  Psychiatric:        Behavior: Behavior is cooperative.      Diagnostic studies: none        Marty Shaggy, MD  [facility] 

## 2023-10-24 ENCOUNTER — Encounter: Admitting: Rheumatology

## 2023-10-27 ENCOUNTER — Encounter: Payer: Self-pay | Admitting: Family

## 2023-10-27 ENCOUNTER — Ambulatory Visit (INDEPENDENT_AMBULATORY_CARE_PROVIDER_SITE_OTHER): Admitting: Family

## 2023-10-27 DIAGNOSIS — L230 Allergic contact dermatitis due to metals: Secondary | ICD-10-CM

## 2023-10-27 NOTE — Progress Notes (Signed)
 Follow-up Note  RE: [name] FMW:[id number]   DOB: [birth year] Date of Office Visit: 10/27/23  Primary care provider: Gil Greig BRAVO., NP Referring provider: Gil Greig BRAVO., NP   [name] returns to the office today for the metal patch test placement, given suspected history of contact dermatitis. She reports that in the past she has reacted to jewelry which she suspects was cheap jewelry. She also has reacted to a copper IUD.   Diagnostics: Metals testing placed Chromium chloride 1%, potassium dichromate 0.25%, cobalt chloride hexahydrate 1%, copper sulfate pentahydrate 2%, molybdenum chloride 0.5%, titanium 0.1%, tantal, magnese chloride 0.5%, nickel sulfate hexahydrate 5%, aluminum hydroxide 10%, and vanadium pentoxide 10%.   Plan:   Allergic contact dermatitis Discussed with patient that patch testing tests for contact dermatitis and sometimes it does not correlate to how one will react to metals in the body. Positive patch testing results can help in avoiding those items however it is possible to get false negative results.  Nevertheless, this is the most accessible test for metal sensitivity currently available.  Metal Patches placed today. Please avoid strenuous physical activities and do not get the patches on the back wet. No showering until final patch reading done. Okay to take antihistamines for itching but avoid placing any creams on the back where the patches are. We will remove the patches on Wednesday and will do our initial read, next reading on Friday, and final reading on Momday  Call the clinic if this treatment plan is not working well for you  Wanda Craze, FNP [facility]

## 2023-10-29 ENCOUNTER — Ambulatory Visit (INDEPENDENT_AMBULATORY_CARE_PROVIDER_SITE_OTHER): Admitting: Family

## 2023-10-29 ENCOUNTER — Encounter: Payer: Self-pay | Admitting: Family

## 2023-10-29 DIAGNOSIS — L230 Allergic contact dermatitis due to metals: Secondary | ICD-10-CM

## 2023-10-29 NOTE — Progress Notes (Signed)
 Follow-up Note   RE: Kelleen Stolze      FMW:[id number]         DOB: [birth year] Date of Office Visit: 10/29/23   Primary care provider: Gil Greig BRAVO., NP Referring provider: Gil Greig BRAVO., NP   Harlene returns to the office today for the metal patch test reading, given suspected history of contact dermatitis    Metals Patch     Time Antigen Placed      Location Back    Number of Test 11    Reading Interval  Wednesday    Chromium chloride 1%  0     Cobalt chloride hexahydrate 1%  0     Molybdenum chloride 0.5%  0     Nickel sulfate hexahydrate 5%  0     Potassium dichromate 0.25%  0     Copper sulfate pentahydrate 2%  0     Titanium 0.1%  0     Manganese chloride 0.5%  0     Tantal 1%  0    Vanadium pentoxide 10%  0   Aluminum hydroxide 10%  0    Nickel sulfate hexahydrate 5%  0     Comments  all negative   Discussed with patient that patch testing tests for contact dermatitis and sometimes it does not correlate to how one will react to metals in the body. Positive patch testing results can help in avoiding those items however it is possible to get false negative results.  Nevertheless, this is the most accessible test for metal sensitivity currently available.  Metal Patches removed today. Okay to take antihistamines for itching but avoid placing any creams on the back where the patches were. Ok to shower now Please return for future metal patch readings on this Friday and next Monday   Wanda Craze, FNP [facility]

## 2023-10-31 ENCOUNTER — Ambulatory Visit: Admitting: Internal Medicine

## 2023-10-31 DIAGNOSIS — L2481 Irritant contact dermatitis due to metals: Secondary | ICD-10-CM

## 2023-10-31 NOTE — Progress Notes (Signed)
   Follow Up Note  RE: Keyanna Sandefer MRN: [id number] DOB: July 30, 1985 Date of Office Visit: 10/31/2023  Referring provider: Gil Greig BRAVO, NP Primary care provider: Gil Greig BRAVO, NP  History of Present Illness: I had the pleasure of seeing Ziana Heyliger for a follow up visit at the [facility] on 10/31/2023. She is a 38 y.o. female, who is being followed for second patch test read for contact dermatitis..  Diagnostics:   Metals Patch     Time Antigen Placed      Location     Number of Test     Reading Interval  Day 5    Chromium chloride 1%  0     Cobalt chloride hexahydrate 1%  0     Molybdenum chloride 0.5%  0     Nickel sulfate hexahydrate 5%  0     Potassium dichromate 0.25%  0     Copper sulfate pentahydrate 2%  0     Titanium 0.1%  0     Manganese chloride 0.5%  0     Tantal 1%  0    Vanadium pentoxide 10%  0   Aluminum hydroxide 10%  0           Comments  all negative      Assessment and Plan: Muntaha is a 38 y.o. female with: Concern for Contact Dermatitis:  The patient has been provided detailed information regarding the substances she is sensitive to, as well as products containing the substances.  Meticulous avoidance of these substances is recommended. If avoidance is not possible, the use of barrier creams or lotions is recommended. If symptoms persist or progress despite meticulous avoidance of chemicals/substances above, dermatology evaluation may be warranted. No follow-ups on file.  It was my pleasure to see Ruthetta today and participate in her care. Please feel free to contact me with any questions or concerns.  Sincerely,   Arleta Blanch, MD [facility]

## 2023-11-02 NOTE — Progress Notes (Unsigned)
   Follow Up Note  RE: Dayanara Sherrill MRN: [id number] DOB: 04/25/1985 Date of Office Visit: 11/03/2023  Referring provider: Gil Greig BRAVO, NP Primary care provider: Gil Greig BRAVO, NP  History of Present Illness: I had the pleasure of seeing Brook Geraci for a follow up visit at the [facility] on 11/03/2023. She is a 38 y.o. female, who is being followed for dermatitis. Today she is here for final patch test interpretation, given suspected history of contact dermatitis.    Diagnostics:  Metal patches 7 day reading.     Assessment and Plan: Brantlee is a 38 y.o. female with: There are no diagnoses linked to this encounter. Patches removed and 48 hour reading was ***. The patient has been provided detailed information regarding the substances she is sensitive to, as well as products containing the substances.  Meticulous avoidance of these substances is recommended.   No follow-ups on file.  It was my pleasure to see Ulah today and participate in her care. Please feel free to contact me with any questions or concerns.  Sincerely,  Orlan Cramp, DO Allergy  & Immunology  [facility]  [place] office: [phone] [facility] office: [phone]

## 2023-11-03 ENCOUNTER — Encounter: Payer: Self-pay | Admitting: Allergy

## 2023-11-03 ENCOUNTER — Ambulatory Visit (INDEPENDENT_AMBULATORY_CARE_PROVIDER_SITE_OTHER): Admitting: Allergy

## 2023-11-03 DIAGNOSIS — L2389 Allergic contact dermatitis due to other agents: Secondary | ICD-10-CM | POA: Diagnosis not present

## 2023-11-03 NOTE — Patient Instructions (Addendum)
 Final metal patch testing reading - all negative.  Will send results to Dr. Stephens.  Discussed with patient that patch testing tests for contact dermatitis and sometimes it does not correlate to how one will react to metals in the body. Positive patch testing results can help in avoiding those items however it is possible to get false negative results.  Nevertheless, this is the most accessible test for metal sensitivity currently available.   Recommend to discuss with your surgeon.   Follow up with Dr. Iva as scheduled.

## 2023-11-05 NOTE — Progress Notes (Signed)
 Office Visit Note  Patient: [name]             Date of Birth: 21-Feb-1985           MRN: [id number]             PCP: Gil Greig BRAVO, NP Referring: Gil Greig BRAVO, NP Visit Date: 11/19/2023 Occupation: Data Unavailable  Subjective:  +RNP, Fatigue  History of Present Illness: [name] is a 38 y.o. female with positive ANA arthralgias myalgias and fatigue.  She returns today after initial visit on October 09, 2023.  Patient states she continues to have generalized pain and discomfort for many years.  She also has myalgias.  She reports hyperalgesia and increased sensitivity to sound, light and taste.  She continues to have insomnia and fatigue.  She has not seen any joint swelling but she feels tight all over.  She continues to have pelvic pain for which she is getting procedure soon.  She gives history of dysmenorrhea.  She gets infrequent headaches.  She continues to have constant discomfort in the hip region and the lower back.    Activities of Daily Living:  Patient reports morning stiffness for 30-60 minutes.   Patient Reports nocturnal pain.  Difficulty dressing/grooming: Denies Difficulty climbing stairs: Reports Difficulty getting out of chair: Denies Difficulty using hands for taps, buttons, cutlery, and/or writing: Denies  Review of Systems  Constitutional:  Positive for fatigue.  HENT:  Positive for mouth dryness. Negative for mouth sores.   Eyes:  Positive for dryness.  Respiratory:  Positive for shortness of breath.   Cardiovascular:  Positive for palpitations. Negative for chest pain.  Gastrointestinal:  Positive for constipation. Negative for blood in stool and diarrhea.  Endocrine: Positive for increased urination.  Genitourinary:  Negative for involuntary urination.  Musculoskeletal:  Positive for gait problem, myalgias, muscle weakness, morning stiffness, muscle tenderness and myalgias. Negative for joint pain, joint pain and joint swelling.  Skin:   Positive for color change. Negative for rash, hair loss and sensitivity to sunlight.  Allergic/Immunologic: Positive for susceptible to infections.  Neurological:  Positive for dizziness. Negative for headaches.  Hematological:  Negative for swollen glands.  Psychiatric/Behavioral:  Positive for depressed mood and sleep disturbance. The patient is nervous/anxious.     PMFS History:  Patient Active Problem List   Diagnosis Date Noted   Constipation 07/15/2023   Persistent depressive disorder 05/12/2023   Cognitive changes 05/12/2023   Fibroids 04/21/2023   Urinary incontinence 04/14/2023   Generalized edema 04/14/2023   Sensory loss 04/14/2023   Gait abnormality 03/03/2023   Paresthesia 03/03/2023   Seasonal and perennial allergic rhinitis 12/30/2022   Seasonal allergic conjunctivitis 12/30/2022   Poor sleep 07/25/2022   Weight gain 07/25/2022   Pure hypercholesterolemia 03/20/2022   Generalized obesity with starting BMI 32 03/06/2022   Low back pain 03/06/2022   Vitamin D  deficiency 03/06/2022   SOBOE (shortness of breath on exertion) 03/06/2022   Other fatigue 03/06/2022   Anxiety 07/31/2021   Gastroesophageal reflux disease without esophagitis 07/31/2021   Genital herpes simplex 07/31/2021   History of abnormal cervical Papanicolaou smear 07/31/2021   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   Subclinical hyperthyroidism 07/31/2021   Twin pregnancy 07/31/2021   Polycystic ovary syndrome 07/31/2021   Hashimoto's disease 05/21/2021   Thyroid  antibody positive 01/24/2021   Vitiligo 01/24/2021   Pregnancy 02/29/2020   Vaginal tear resulting from childbirth 02/29/2020   Hyperandrogenemia 02/04/2012   PCOS (polycystic ovarian syndrome) 02/04/2012  Past Medical History:  Diagnosis Date   Anemia    Anxiety    Back pain    Constipation    Hashimoto's disease    History of chicken pox    HSV infection    Joint pain    Lactose intolerance    Lower back pain    Palpitations     PCOS (polycystic ovarian syndrome)    PCOS (polycystic ovarian syndrome)    Right hip pain    SOBOE (shortness of breath on exertion)    Thyroid  condition    Velamentous insertion of umbilical cord    Vitamin D  deficiency    Vitiligo     Family History  Problem Relation Age of Onset   Allergic rhinitis Mother    Urticaria Mother    Asthma Mother    Thyroid  disease Mother    Hypertension Mother    Cancer Mother        Cervical Cancer   Heart disease Mother    Depression Mother    Anxiety disorder Mother    Obesity Mother    Other Mother        colon ischemia   Hypertension Father    Hepatitis C Father    Allergic rhinitis Brother    Asthma Brother    Vitiligo Brother    Allergic rhinitis Brother    Urticaria Brother    Allergic rhinitis Brother    Healthy Brother    Healthy Brother    Breast cancer Maternal Aunt 50   Thyroid  disease Maternal Aunt    Ovarian cancer Paternal Aunt 50   Thyroid  disease Maternal Grandmother    Allergic rhinitis Son    Eczema Son    Stroke Neg Hx    Hyperlipidemia Neg Hx    Early death Neg Hx    Past Surgical History:  Procedure Laterality Date   PERINEAL LACERATION REPAIR N/A 03/01/2020   Procedure: SUTURE REPAIR PERINEAL LACERATION;  Surgeon: Marget Lenis, MD;  Location: [facility];  Service: Gynecology;  Laterality: N/A;   VENOGRAM  09/27/2023   Social History   Tobacco Use   Smoking status: Never    Passive exposure: Past   Smokeless tobacco: Never  Vaping Use   Vaping status: Never Used  Substance Use Topics   Alcohol use: Yes    Alcohol/week: 1.0 standard drink of alcohol    Types: 1 Glasses of wine per week    Comment: rarely   Drug use: No   Social History   Social History Narrative   Regular exercise-yes Caffeine Use-yes     Immunization History  Administered Date(s) Administered   HPV 9-valent 07/25/2023   Influenza-Unspecified 12/10/2019   Moderna Covid-19 Vaccine Bivalent Booster 77yrs & up 11/29/2019    Moderna Sars-Covid-2 Vaccination 03/09/2019, 04/06/2019   Tdap 12/06/2019     Objective: Vital Signs: BP 123/84 (BP Location: Left Arm, Patient Position: Sitting, Cuff Size: Small)   Pulse 84   Temp 98.4 F (36.9 C)   Resp 13   Ht 5' 3 (1.6 m)   Wt 192 lb 12.8 oz (87.5 kg)   LMP 11/19/2023 (Exact Date)   BMI 34.15 kg/m    Physical Exam Vitals and nursing note reviewed.  Constitutional:      Appearance: She is well-developed.  HENT:     Head: Normocephalic and atraumatic.  Eyes:     Conjunctiva/sclera: Conjunctivae normal.  Cardiovascular:     Rate and Rhythm: Normal rate and regular rhythm.  Heart sounds: Normal heart sounds.  Pulmonary:     Effort: Pulmonary effort is normal.     Breath sounds: Normal breath sounds.  Abdominal:     General: Bowel sounds are normal.     Palpations: Abdomen is soft.  Musculoskeletal:     Cervical back: Normal range of motion.  Lymphadenopathy:     Cervical: No cervical adenopathy.  Skin:    General: Skin is warm and dry.     Capillary Refill: Capillary refill takes less than 2 seconds.  Neurological:     Mental Status: She is alert and oriented to person, place, and time.  Psychiatric:        Behavior: Behavior normal.      Musculoskeletal Exam: Cervical, thoracic and lumbar spine were in good range of motion.  There was no SI joint tenderness.  Shoulder joints, elbow joints, wrist joints, MCPs, PIPs and DIPs were in good range of motion with no synovitis.  Hip joints and knee joints were in good range of motion without any warmth swelling or effusion.  There was no tenderness over ankles or MTPs.  Mild hypermobility of joints was noted.  She had generalized hyperalgesia and positive tender points.  CDAI Exam: CDAI Score: -- Patient Global: --; Provider Global: -- Swollen: --; Tender: -- Joint Exam 11/19/2023   No joint exam has been documented for this visit   There is currently no information documented on the homunculus.  Go to the Rheumatology activity and complete the homunculus joint exam.  Investigation: No additional findings.  Imaging: No results found.  Recent Labs: Lab Results  Component Value Date   WBC 8.7 03/18/2023   HGB 13.6 03/18/2023   PLT 231 03/18/2023   NA 140 03/11/2023   K 3.7 03/11/2023   CL 101 03/11/2023   CO2 24 03/11/2023   GLUCOSE 79 03/11/2023   BUN 12 03/11/2023   CREATININE 0.78 03/11/2023   BILITOT 0.2 03/11/2023   ALKPHOS 49 03/11/2023   AST 14 03/11/2023   ALT 12 03/11/2023   PROT 7.0 03/11/2023   ALBUMIN 4.4 03/11/2023   CALCIUM  9.5 03/11/2023   October 09, 2023 ANA negative, RNP 1.8 (dsDNA, SSA, SSB, Smith, SCL 70 negative), C3-C4 normal, anticardiolipin negative, beta-2  GP 1 negative, urine protein creatinine ratio normal, RF negative, anti-CCP negative, CK 49  Speciality Comments: No specialty comments available.  Procedures:  No procedures performed Allergies: Nickel, Cherry, Banana, and Spinach   Assessment / Plan:     Visit Diagnoses: Positive ANA (antinuclear antibody) - Repeat ANA negative, RNP positive -there is no history of oral ulcers, nasal ulcers, malar rash, photosensitivity, Raynaud's, lymphadenopathy or inflammatory arthritis.  She continues to have generalized arthralgias and myalgias which she has been experiencing since she was a teenager.  She also has extreme fatigue and insomnia.  I did detailed discussion with the patient regarding her labs including completely negative ENA panel except for low titer positive RNP.  Complements were normal and anticardiolipin antibodies were negative.  I advised her to contact me if she develops any new symptoms.  I will recheck labs in a year.  Plan: Protein / creatinine ratio, urine, CBC with Differential/Platelet, Comprehensive metabolic panel with GFR, ANA, Anti-DNA antibody, double-stranded, C3 and C4, Sedimentation rate, RNP Antibody  Scalp psoriasis - Started at age 77.  Psoriasis is responsive to  topical agents.  Chronic pain of both hips -she tenderness over bilateral trochanteric region.  Bilateral trochanteric bursa tenderness was noted on the examination.  IT band stretches were advised.  Benefits of water aerobics and swimming were discussed.  Chronic midline low back pain without sciatica -she reports ongoing discomfort in her back since she was a teenager.  Mild degenerative changes were noted in the lumbar spine of the MRI done in April 2025 - Plan: Ambulatory referral to Physical Therapy, AMB referral to sports medicine  Hypermobility of joint -she has mild hypermobility in her joints.  Patient is concerned that she may have a genetic disorder.  Plan: AMB referral to sports medicine  Hyperalgesia-she has hyperalgesia, positive tender points.  She also gives history of dysmenorrhea and increased sensitivity to sound, light and taste.  May-Thurner syndrome - Scheduled to get bilateral iliac vein stents.  Female pelvic congestion syndrome - Related to May-Thurner syndrome.  Chronic pelvic pain in female  Myofascial pain syndrome -she can history of fatigue, hyperalgesia and had positive tender points.  I will refer her to integrative therapies.  Plan: Ambulatory referral to Physical Therapy (integrative therapies).  Detailed counsel regarding myofascial pain syndrome was provided.  There is a history of fibromyalgia syndrome in her mother.  Benefits of stretching, water aerobics and swimming were discussed.  Other medical problems are listed as follows:  Hashimoto's thyroiditis  Vitamin D  deficiency  Pure hypercholesterolemia  Gastroesophageal reflux disease without esophagitis  Vitiligo  Seasonal and perennial allergic rhinitis  Paresthesia  Anxiety and depression  Dysmenorrhea  Other fatigue-probably related to myofascial pain syndrome and insomnia.  Poor sleep-sleep hygiene was discussed.  Hyperandrogenemia  PCOS (polycystic ovarian syndrome)  Genital  herpes simplex, unspecified site  Generalized obesity with starting BMI 32  Family history of rheumatoid arthritis-mother  Family history of fibromyalgia-mother  Orders: Orders Placed This Encounter  Procedures   Protein / creatinine ratio, urine   CBC with Differential/Platelet   Comprehensive metabolic panel with GFR   ANA   Anti-DNA antibody, double-stranded   C3 and C4   Sedimentation rate   RNP Antibody   Ambulatory referral to Physical Therapy   AMB referral to sports medicine   No orders of the defined types were placed in this encounter.    Follow-Up Instructions: Return in about 1 year (around 11/18/2024) for +RNP.   Maya Nash, MD  Note - This record has been created using Animal nutritionist.  Chart creation errors have been sought, but may not always  have been located. Such creation errors do not reflect on  the standard of medical care.

## 2023-11-19 ENCOUNTER — Encounter: Payer: Self-pay | Admitting: Rheumatology

## 2023-11-19 ENCOUNTER — Ambulatory Visit: Attending: Rheumatology | Admitting: Rheumatology

## 2023-11-19 VITALS — BP 123/84 | HR 84 | Temp 98.4°F | Resp 13 | Ht 63.0 in | Wt 192.8 lb

## 2023-11-19 DIAGNOSIS — Z8261 Family history of arthritis: Secondary | ICD-10-CM | POA: Diagnosis present

## 2023-11-19 DIAGNOSIS — L80 Vitiligo: Secondary | ICD-10-CM | POA: Insufficient documentation

## 2023-11-19 DIAGNOSIS — K219 Gastro-esophageal reflux disease without esophagitis: Secondary | ICD-10-CM | POA: Insufficient documentation

## 2023-11-19 DIAGNOSIS — R5383 Other fatigue: Secondary | ICD-10-CM | POA: Insufficient documentation

## 2023-11-19 DIAGNOSIS — Z8269 Family history of other diseases of the musculoskeletal system and connective tissue: Secondary | ICD-10-CM | POA: Insufficient documentation

## 2023-11-19 DIAGNOSIS — R208 Other disturbances of skin sensation: Secondary | ICD-10-CM | POA: Diagnosis present

## 2023-11-19 DIAGNOSIS — N9489 Other specified conditions associated with female genital organs and menstrual cycle: Secondary | ICD-10-CM | POA: Diagnosis present

## 2023-11-19 DIAGNOSIS — M25552 Pain in left hip: Secondary | ICD-10-CM | POA: Insufficient documentation

## 2023-11-19 DIAGNOSIS — N946 Dysmenorrhea, unspecified: Secondary | ICD-10-CM | POA: Diagnosis present

## 2023-11-19 DIAGNOSIS — R7689 Other specified abnormal immunological findings in serum: Secondary | ICD-10-CM | POA: Insufficient documentation

## 2023-11-19 DIAGNOSIS — M249 Joint derangement, unspecified: Secondary | ICD-10-CM | POA: Insufficient documentation

## 2023-11-19 DIAGNOSIS — I871 Compression of vein: Secondary | ICD-10-CM | POA: Diagnosis present

## 2023-11-19 DIAGNOSIS — J3089 Other allergic rhinitis: Secondary | ICD-10-CM | POA: Diagnosis present

## 2023-11-19 DIAGNOSIS — E063 Autoimmune thyroiditis: Secondary | ICD-10-CM | POA: Insufficient documentation

## 2023-11-19 DIAGNOSIS — M545 Low back pain, unspecified: Secondary | ICD-10-CM | POA: Diagnosis present

## 2023-11-19 DIAGNOSIS — F419 Anxiety disorder, unspecified: Secondary | ICD-10-CM | POA: Insufficient documentation

## 2023-11-19 DIAGNOSIS — E282 Polycystic ovarian syndrome: Secondary | ICD-10-CM | POA: Insufficient documentation

## 2023-11-19 DIAGNOSIS — R102 Pelvic and perineal pain unspecified side: Secondary | ICD-10-CM | POA: Diagnosis present

## 2023-11-19 DIAGNOSIS — M7918 Myalgia, other site: Secondary | ICD-10-CM | POA: Diagnosis present

## 2023-11-19 DIAGNOSIS — M25551 Pain in right hip: Secondary | ICD-10-CM | POA: Diagnosis not present

## 2023-11-19 DIAGNOSIS — E669 Obesity, unspecified: Secondary | ICD-10-CM | POA: Insufficient documentation

## 2023-11-19 DIAGNOSIS — E7800 Pure hypercholesterolemia, unspecified: Secondary | ICD-10-CM | POA: Diagnosis present

## 2023-11-19 DIAGNOSIS — R202 Paresthesia of skin: Secondary | ICD-10-CM | POA: Insufficient documentation

## 2023-11-19 DIAGNOSIS — Z72820 Sleep deprivation: Secondary | ICD-10-CM | POA: Diagnosis present

## 2023-11-19 DIAGNOSIS — L409 Psoriasis, unspecified: Secondary | ICD-10-CM | POA: Insufficient documentation

## 2023-11-19 DIAGNOSIS — F32A Depression, unspecified: Secondary | ICD-10-CM | POA: Insufficient documentation

## 2023-11-19 DIAGNOSIS — G8929 Other chronic pain: Secondary | ICD-10-CM | POA: Insufficient documentation

## 2023-11-19 DIAGNOSIS — E281 Androgen excess: Secondary | ICD-10-CM | POA: Diagnosis present

## 2023-11-19 DIAGNOSIS — A6000 Herpesviral infection of urogenital system, unspecified: Secondary | ICD-10-CM | POA: Diagnosis present

## 2023-11-19 DIAGNOSIS — J302 Other seasonal allergic rhinitis: Secondary | ICD-10-CM | POA: Diagnosis present

## 2023-11-19 DIAGNOSIS — E559 Vitamin D deficiency, unspecified: Secondary | ICD-10-CM | POA: Insufficient documentation

## 2023-12-08 ENCOUNTER — Encounter: Admitting: Family Medicine

## 2023-12-08 ENCOUNTER — Encounter: Payer: Self-pay | Admitting: Family Medicine

## 2023-12-08 ENCOUNTER — Ambulatory Visit (INDEPENDENT_AMBULATORY_CARE_PROVIDER_SITE_OTHER): Admitting: Family Medicine

## 2023-12-08 ENCOUNTER — Encounter: Payer: Self-pay | Admitting: Radiology

## 2023-12-08 VITALS — BP 122/84 | HR 94 | Ht 63.0 in | Wt 192.0 lb

## 2023-12-08 DIAGNOSIS — G901 Familial dysautonomia [Riley-Day]: Secondary | ICD-10-CM | POA: Diagnosis not present

## 2023-12-08 DIAGNOSIS — I871 Compression of vein: Secondary | ICD-10-CM | POA: Insufficient documentation

## 2023-12-08 DIAGNOSIS — Q7962 Hypermobile Ehlers-Danlos syndrome: Secondary | ICD-10-CM | POA: Insufficient documentation

## 2023-12-08 DIAGNOSIS — G8929 Other chronic pain: Secondary | ICD-10-CM

## 2023-12-08 DIAGNOSIS — M545 Low back pain, unspecified: Secondary | ICD-10-CM

## 2023-12-08 NOTE — Progress Notes (Signed)
   I, Leotis Batter, CMA acting as a scribe for Artist Lloyd, MD.  Sneha Willig is a 38 y.o. female who presents to [facility] at [facility] today for evaluation of her hypermobility and LBP. Has been on Gabapentin  and Amitriptyline  in the past. Alternates Zyrtec and Xyzal . 2 weeks post stent for Mayturner Syndrome.   MS: Chronic joint pain, Chronic wide spread muscle pain, Joint Hypermobility, Joint Instability, and Abnormal spinal curvature Skin/Immune reactions: Hyperextensible/ stretchy Skin, Soft Velvety Skin, Poor Wound Healing, Easy Bruising / Bleeding, Rashes / Hives, and Medication / Chemical / Food Sensitivities  Neurological: Syncope / Pre-Syncope, Reduced Proprioception, Clumsiness , Burning, Numbness and/or Tingling in Extremities, Cognitive Dysfunction / Brain Fog, Sleep Disturbance, and Sensory Sensitivities ANS: Syncope / Pre-Syncope / Dizziness, Fatigue, Exercise / Temperature Intolerance , and Anxiety / Panic Respiratory: Dyspnea and Chest Tightness CV: Tachycardia and Varicose Veins GI:  GERD, Gastroparesis, Painful ABD Bloating, Frequent N/V, and Prolapse Genitourinary: Pelvic Pain / Fullness / Pressure, Incontinence, Painful Intercourse, and Prolapse Hands & Feet: Piezogenic Papules  Pertinent review of systems: No fever or chills  Relevant historical information: 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   Exam:  BP 122/84   Pulse 94   Ht 5' 3 (1.6 m)   Wt 192 lb (87.1 kg)   LMP 11/19/2023 (Exact Date)   SpO2 98%   BMI 34.01 kg/m  General: Well Developed, well nourished, and in no acute distress.   MSK: Hypermobility evaluation. Positive Beighton score 5/9. Ehlers-Danlos evaluation is positive with a score of 5 positive for unusually soft or velvety skin, mild skin hyperextensibility, bilateral piezogenic papules, dental crowding, mitral valve  prolapse/regurgitation on echocardiogram.  Patient does have pelvic floor dysfunction/prolapse but that occurred after childbirth.    Lab and Radiology Results  Echocardiogram, CT angiogram, MRI cervical, thoracic and lumbar spine images reviewed    Assessment and Plan: 38 y.o. female with hypermobile Ehlers-Danlos syndrome. Patient does have chronic musculoskeletal pain and will be a good candidate for physical therapy.  Plan for physical therapy referral.  Additionally patient has dysautonomia likely POTS.  We reviewed salt and fluid goals.  Fixing the May-Thurner syndrome should be quite helpful.  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  Recommended famotidine and cetirizine. We talked about pain medications that could be used as well.  Recheck in 1 month    PDMP not reviewed this encounter. Orders Placed This Encounter  Procedures   Ambulatory referral to Physical Therapy    Referral Priority:   Routine    Referral Type:   Physical Medicine    Referral Reason:   Specialty Services Required    Requested Specialty:   Physical Therapy    Number of Visits Requested:   1   No orders of the defined types were placed in this encounter.    Discussed warning signs or symptoms. Please see discharge instructions. Patient expresses understanding.   The above documentation has been reviewed and is accurate and complete Artist Lloyd, M.D.

## 2023-12-08 NOTE — Patient Instructions (Addendum)
 Thank you for coming in today.   A referral for physical therapy has been submitted. A representative from the physical therapy office will contact you to coordinate scheduling after confirming your benefits with your insurance provider. If you do not hear from the physical therapy office within the next 1-2 weeks, please let us  know.  Check out the book Disjointed Navigating the Diagnosis and Management of Hypermobile Ehlers-Danlos Syndrome and Hypermobility Spectrum Disorders.    For POTS symptoms: Consume 3 L water and 8-12 gram of salt per day   Check out these websites for exercises to try if you have POTS (CHOP Protocol, [place] Protocol, and [employer] Protocol) [email or web address].taylor-robbins.[email or web address]/ https://dean.info/   Guide to Prof. Russek's Patient Self-Care Handouts https://webspace.Http://www.black-smith.org/  Orthostatic Intolerance and Postural Orthostatic Tachycardia Self-Care Checklist https://webspace.Wvlyxc.com  Steps To Managing Your Mast Cell Activation Syndrome https://webspace.Newyearsms.fi.pdf   See you back in 1 month.

## 2023-12-10 ENCOUNTER — Encounter: Admitting: Allergy

## 2024-01-06 NOTE — Progress Notes (Deleted)
 Subjective:    Patient ID: [name], female    DOB: 11-Aug-1985, 38 y.o.   MRN: [id number]  HPI   Pain Inventory Average Pain {NUMBERS; 0-10:5044} Pain Right Now {NUMBERS; 0-10:5044} My pain is {PAIN DESCRIPTION:21022940}  In the last 24 hours, has pain interfered with the following? General activity {NUMBERS; 0-10:5044} Relation with others {NUMBERS; 0-10:5044} Enjoyment of life {NUMBERS; 0-10:5044} What TIME of day is your pain at its worst? {time of day:24191} Sleep (in general) {BHH GOOD/FAIR/POOR:22877}  Pain is worse with: {ACTIVITIES:21022942} Pain improves with: {PAIN IMPROVES TPUY:78977056} Relief from Meds: {NUMBERS; 0-10:5044}  Family History  Problem Relation Age of Onset   Allergic rhinitis Mother    Urticaria Mother    Asthma Mother    Thyroid  disease Mother    Hypertension Mother    Cancer Mother        Cervical Cancer   Heart disease Mother    Depression Mother    Anxiety disorder Mother    Obesity Mother    Other Mother        colon ischemia   Hypertension Father    Hepatitis C Father    Allergic rhinitis Brother    Asthma Brother    Vitiligo Brother    Allergic rhinitis Brother    Urticaria Brother    Allergic rhinitis Brother    Healthy Brother    Healthy Brother    Breast cancer Maternal Aunt 50   Thyroid  disease Maternal Aunt    Ovarian cancer Paternal Aunt 50   Thyroid  disease Maternal Grandmother    Allergic rhinitis Son    Eczema Son    Stroke Neg Hx    Hyperlipidemia Neg Hx    Early death Neg Hx    Social History   Socioeconomic History   Marital status: Significant Other    Spouse name: Okey   Number of children: Not on file   Years of education: 16+   Highest education level: Not on file  Occupational History   Occupation: Occupational therapist   Occupation: Acupuncturist  Tobacco Use   Smoking status: Never    Passive exposure: Past   Smokeless tobacco: Never  Vaping Use   Vaping status: Never Used   Substance and Sexual Activity   Alcohol use: Yes    Alcohol/week: 1.0 standard drink of alcohol    Types: 1 Glasses of wine per week    Comment: rarely   Drug use: No   Sexual activity: Yes    Partners: Male    Birth control/protection: Condom  Other Topics Concern   Not on file  Social History Narrative   Regular exercise-yes Caffeine Use-yes   Social Drivers of Health   Financial Resource Strain: Not on file  Food Insecurity: Not on file  Transportation Needs: Not on file  Physical Activity: Not on file  Stress: Not on file  Social Connections: Not on file   Past Surgical History:  Procedure Laterality Date   PERINEAL LACERATION REPAIR N/A 03/01/2020   Procedure: SUTURE REPAIR PERINEAL LACERATION;  Surgeon: Marget Lenis, MD;  Location: [facility];  Service: Gynecology;  Laterality: N/A;   VENOGRAM  09/27/2023   Past Surgical History:  Procedure Laterality Date   PERINEAL LACERATION REPAIR N/A 03/01/2020   Procedure: SUTURE REPAIR PERINEAL LACERATION;  Surgeon: Marget Lenis, MD;  Location: [facility];  Service: Gynecology;  Laterality: N/A;   VENOGRAM  09/27/2023   Past Medical History:  Diagnosis Date   Anemia  Anxiety    Back pain    Constipation    Hashimoto's disease    History of chicken pox    HSV infection    Joint pain    Lactose intolerance    Lower back pain    Palpitations    PCOS (polycystic ovarian syndrome)    PCOS (polycystic ovarian syndrome)    Right hip pain    SOBOE (shortness of breath on exertion)    Thyroid  condition    Velamentous insertion of umbilical cord    Vitamin D  deficiency    Vitiligo    There were no vitals taken for this visit.  Opioid Risk Score:   Fall Risk Score:  `1  Depression screen [facility] 2/9     08/21/2023    9:32 AM 07/09/2023    9:50 AM 05/12/2023   11:12 AM 04/14/2023    2:32 PM 03/13/2023    2:31 PM 03/03/2023   10:42 AM 03/06/2022    7:39 AM  Depression screen PHQ 2/9  Decreased Interest 0 1 1 0 0 0 3  Down,  Depressed, Hopeless 0 1 1 1  0 0 3  PHQ - 2 Score 0 2 2 1  0 0 6  Altered sleeping  1  3   2   Tired, decreased energy  1  3   2   Change in appetite  0  0   3  Feeling bad or failure about yourself   0  0   1  Trouble concentrating  0  0   2  Moving slowly or fidgety/restless  0  3   0  Suicidal thoughts  0  0   0  PHQ-9 Score  4   10    16    Difficult doing work/chores  Very difficult  Extremely dIfficult   Somewhat difficult     Data saved with a previous flowsheet row definition    Review of Systems     Objective:   Physical Exam        Assessment & Plan:

## 2024-01-07 ENCOUNTER — Encounter: Admitting: Physical Medicine and Rehabilitation

## 2024-01-12 ENCOUNTER — Ambulatory Visit: Admitting: Family Medicine

## 2024-01-12 NOTE — Progress Notes (Deleted)
   Kristina Ileana Collet, PhD, LAT, ATC acting as a scribe for Artist Lloyd, MD.  [name] is a 38 y.o. female who presents to [facility] at [facility] today for 78-month f/u hEDS, dysautonomia, LBP, and MCAS. Pt was last seen by Dr. Lloyd on 12/08/23 and was advised on fluid/salt intake, recommended famotidine, cetirizine, and was referred to PT.  Today, pt reports ***  Pertinent review of systems: ***  Relevant historical information: ***   Exam:  There were no vitals taken for this visit. General: Well Developed, well nourished, and in no acute distress.   MSK: ***    Lab and Radiology Results No results found for this or any previous visit (from the past 72 hours). No results found.     Assessment and Plan: 38 y.o. female with ***   PDMP not reviewed this encounter. No orders of the defined types were placed in this encounter.  No orders of the defined types were placed in this encounter.    Discussed warning signs or symptoms. Please see discharge instructions. Patient expresses understanding.   ***

## 2024-01-13 ENCOUNTER — Encounter: Payer: Self-pay | Admitting: Nurse Practitioner

## 2024-01-13 ENCOUNTER — Ambulatory Visit: Admitting: Nurse Practitioner

## 2024-01-13 VITALS — BP 116/70 | HR 98 | Temp 98.1°F

## 2024-01-13 DIAGNOSIS — R350 Frequency of micturition: Secondary | ICD-10-CM

## 2024-01-13 DIAGNOSIS — N9489 Other specified conditions associated with female genital organs and menstrual cycle: Secondary | ICD-10-CM

## 2024-01-13 LAB — WET PREP FOR TRICH, YEAST, CLUE

## 2024-01-13 NOTE — Progress Notes (Signed)
   Acute Office Visit  Subjective:    Patient ID: [name], female    DOB: 02-17-1985, 38 y.o.   MRN: [id number]   HPI 38 y.o. presents today for urinary frequency, dysuria and possible hematuria. Feels symptoms are better today. Denies vaginal symptoms. Not sexually active.   Patient's last menstrual period was 12/26/2023 (exact date). Period Duration (Days): 5 Period Pattern: Regular Menstrual Flow: Heavy Menstrual Control: Maxi pad Dysmenorrhea: (!) Moderate Dysmenorrhea Symptoms: Cramping, Nausea, Diarrhea, Headache  Review of Systems  Constitutional: Negative.   Genitourinary:  Positive for dysuria, frequency and vaginal pain. Negative for difficulty urinating, flank pain, hematuria, urgency and vaginal discharge.       Objective:    Physical Exam Constitutional:      Appearance: Normal appearance.  Genitourinary:    General: Normal vulva.     Vagina: Bleeding present.     Cervix: Normal.     BP 116/70   Pulse 98   Temp 98.1 F (36.7 C) (Oral)   LMP 12/26/2023 (Exact Date)   SpO2 98%  Wt Readings from Last 3 Encounters:  12/08/23 192 lb (87.1 kg)  11/19/23 192 lb 12.8 oz (87.5 kg)  10/14/23 193 lb 4.8 oz (87.7 kg)        [name], CMA present as biomedical engineer.   UA negative  Wet prep negative for pathogens  Assessment & Plan:   Problem List Items Addressed This Visit   None Visit Diagnoses       Urinary frequency    -  Primary   Relevant Orders   Urinalysis,Complete w/RFL Culture     Vulvar burning       Relevant Orders   WET PREP FOR TRICH, YEAST, CLUE      Plan: Negative wet prep, UA and exam. Urine culture pending.    Return if symptoms worsen or fail to improve.    Kristina DELENA Shutter DNP, 3:27 PM 01/13/2024

## 2024-01-15 ENCOUNTER — Ambulatory Visit: Payer: Self-pay | Admitting: Nurse Practitioner

## 2024-01-15 LAB — URINALYSIS, COMPLETE W/RFL CULTURE
Bacteria, UA: NONE SEEN /HPF
Bilirubin Urine: NEGATIVE
Glucose, UA: NEGATIVE
Hyaline Cast: NONE SEEN /LPF
Ketones, ur: NEGATIVE
Leukocyte Esterase: NEGATIVE
Nitrites, Initial: NEGATIVE
Protein, ur: NEGATIVE
Specific Gravity, Urine: 1.01 (ref 1.001–1.035)
pH: 7 (ref 5.0–8.0)

## 2024-01-15 LAB — URINE CULTURE
MICRO NUMBER:: 17332114
Result:: NO GROWTH
SPECIMEN QUALITY:: ADEQUATE

## 2024-01-15 LAB — CULTURE INDICATED

## 2024-01-22 ENCOUNTER — Ambulatory Visit: Admitting: Family Medicine

## 2024-01-22 VITALS — BP 110/76 | HR 92 | Ht 63.0 in | Wt 193.0 lb

## 2024-01-22 DIAGNOSIS — G901 Familial dysautonomia [Riley-Day]: Secondary | ICD-10-CM

## 2024-01-22 DIAGNOSIS — Q7962 Hypermobile Ehlers-Danlos syndrome: Secondary | ICD-10-CM | POA: Diagnosis not present

## 2024-01-22 DIAGNOSIS — R202 Paresthesia of skin: Secondary | ICD-10-CM

## 2024-01-22 NOTE — Progress Notes (Unsigned)
° °  Kristina Ileana Collet, PhD, LAT, ATC acting as a scribe for Artist Lloyd, MD.  [name] is a 38 y.o. female who presents to [facility] at [facility] today for 72-month f/u hEDS, dysautonomia, LBP, and MCAS. Pt was last seen by Dr. Lloyd on 12/08/23 and was advised on fluid/salt intake, recommended famotidine, cetirizine, and was referred to PT.  Today, pt reports she thinks she has been doing better w/ fluid/salt intake. She has been taking the famotidine and cetirizine bid and finds it helpful. Reactions seem to be improved. She has not yet had any PT visits yet.    She notes bothersome bilateral lower extremity paresthesias.    Pertinent review of systems: No fevers or chills  Relevant historical information: May-Thurner syndrome status post venous stenting October 2025   Exam:  BP 110/76   Pulse 92   Ht 5' 3 (1.6 m)   Wt 193 lb (87.5 kg)   LMP 12/26/2023 (Exact Date)   SpO2 97%   BMI 34.19 kg/m  General: Well Developed, well nourished, and in no acute distress.   MSK: Normal gait    Lab and Radiology Results No results found for this or any previous visit (from the past 72 hours). No results found.     Assessment and Plan: 38 y.o. female with hypermobile EDS complicated by dysautonomia.  Overall doing okay.  Plan to continue current regimen.  Starting physical therapy in the near future which should be helpful.  She is about 2 months out from her stenting for May-Thurner syndrome which has made a big difference.  She still has bothersome lower extremity paresthesias.  She has had a good workup for this about a year ago including MRI of her brain and entire spine which was generally unremarkable as well as nerve conduction study which was negative.  She is concerned about small fiber neuropathy***consider biopsy***   PDMP not reviewed this encounter. No orders of the defined types were placed in this encounter.  No orders of the defined types were  placed in this encounter.    Discussed warning signs or symptoms. Please see discharge instructions. Patient expresses understanding.   ***

## 2024-01-22 NOTE — Patient Instructions (Addendum)
 Thank you for coming in today.   Let me know how it goes  Check back in 3 months

## 2024-02-09 NOTE — Therapy (Deleted)
 " OUTPATIENT PHYSICAL THERAPY THORACOLUMBAR EVALUATION   Patient Name: [name] MRN: [id number] DOB:Jun 28, 1985, 39 y.o., female Today's Date: 02/09/2024  END OF SESSION:   Past Medical History:  Diagnosis Date   Allergy     Anemia    Anxiety    Back pain    Blood transfusion without reported diagnosis    January 2022 during surgical repair of 4th degree vagina tear   Constipation    Depression    GERD (gastroesophageal reflux disease)    Hashimoto's disease    History of chicken pox    HSV infection    Joint pain    Lactose intolerance    Lower back pain    Neuromuscular disorder (HCC)    Neuropathy alongside vascular issues   Palpitations    PCOS (polycystic ovarian syndrome)    PCOS (polycystic ovarian syndrome)    Right hip pain    SOBOE (shortness of breath on exertion)    Thyroid  condition    Velamentous insertion of umbilical cord    Vitamin D  deficiency    Vitiligo    Past Surgical History:  Procedure Laterality Date   PERINEAL LACERATION REPAIR N/A 03/01/2020   Procedure: SUTURE REPAIR PERINEAL LACERATION;  Surgeon: Marget Lenis, MD;  Location: [facility];  Service: Gynecology;  Laterality: N/A;   VENOGRAM  09/27/2023   Patient Active Problem List   Diagnosis Date Noted   Hypermobile Ehlers-Danlos syndrome 12/08/2023   May-Thurner syndrome 12/08/2023   Dysautonomia (HCC) 12/08/2023   Constipation 07/15/2023   Persistent depressive disorder 05/12/2023   Cognitive changes 05/12/2023   Fibroids 04/21/2023   Urinary incontinence 04/14/2023   Generalized edema 04/14/2023   Sensory loss 04/14/2023   Gait abnormality 03/03/2023   Paresthesia of both lower extremities 03/03/2023   Seasonal and perennial allergic rhinitis 12/30/2022   Seasonal allergic conjunctivitis 12/30/2022   Poor sleep 07/25/2022   Weight gain 07/25/2022   Pure hypercholesterolemia 03/20/2022   Generalized obesity with starting BMI 32 03/06/2022   Low back pain 03/06/2022    Vitamin D  deficiency 03/06/2022   SOBOE (shortness of breath on exertion) 03/06/2022   Other fatigue 03/06/2022   Anxiety 07/31/2021   Gastroesophageal reflux disease without esophagitis 07/31/2021   Genital herpes simplex 07/31/2021   History of abnormal cervical Papanicolaou smear 07/31/2021   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   Subclinical hyperthyroidism 07/31/2021   Twin pregnancy 07/31/2021   Polycystic ovary syndrome 07/31/2021   Hashimoto's disease 05/21/2021   Thyroid  antibody positive 01/24/2021   Vitiligo 01/24/2021   Pregnancy 02/29/2020   Vaginal tear resulting from childbirth 02/29/2020   Hyperandrogenemia 02/04/2012   PCOS (polycystic ovarian syndrome) 02/04/2012    PCP: Gil Greig BRAVO, NP   REFERRING PROVIDER: Joane Artist RAMAN, MD   REFERRING DIAG: Hypermobile Ehlers-Danlos syndrome [Q79.62], Chronic midline low back pain without sciatica [M54.50, G89.29]   Rationale for Evaluation and Treatment: Rehabilitation  THERAPY DIAG:  No diagnosis found.  ONSET DATE: ***  SUBJECTIVE:  SUBJECTIVE STATEMENT: ***  PERTINENT HISTORY:  ***  PAIN:  Are you having pain? {OPRCPAIN:27236}  PRECAUTIONS: {Therapy precautions:24002}  RED FLAGS: {PT Red Flags:29287}   WEIGHT BEARING RESTRICTIONS: {Yes ***/No:24003}  FALLS:  Has patient fallen in last 6 months? {fallsyesno:27318}  LIVING ENVIRONMENT: Lives with: {OPRC lives with:25569::lives with their family} Lives in: {Lives in:25570} Stairs: {opstairs:27293} Has following equipment at home: {Assistive devices:23999}  OCCUPATION: ***  PLOF: {PLOF:24004}  PATIENT GOALS: ***  NEXT MD VISIT: ***  OBJECTIVE:  Note: Objective measures were completed at Evaluation unless otherwise noted.  DIAGNOSTIC FINDINGS:  ***  PATIENT SURVEYS:   {rehab surveys:24030}  COGNITION: Overall cognitive status: {cognition:24006}     SENSATION: {sensation:27233}  MUSCLE LENGTH: Hamstrings: Right *** deg; Left *** deg Debby test: Right *** deg; Left *** deg  POSTURE: {posture:25561}  PALPATION: ***  LUMBAR ROM:   AROM eval  Flexion   Extension   Right lateral flexion   Left lateral flexion   Right rotation   Left rotation    (Blank rows = not tested)  LOWER EXTREMITY ROM:     {AROM/PROM:27142}  Right eval Left eval  Hip flexion    Hip extension    Hip abduction    Hip adduction    Hip internal rotation    Hip external rotation    Knee flexion    Knee extension    Ankle dorsiflexion    Ankle plantarflexion    Ankle inversion    Ankle eversion     (Blank rows = not tested)  LOWER EXTREMITY MMT:    MMT Right eval Left eval  Hip flexion    Hip extension    Hip abduction    Hip adduction    Hip internal rotation    Hip external rotation    Knee flexion    Knee extension    Ankle dorsiflexion    Ankle plantarflexion    Ankle inversion    Ankle eversion     (Blank rows = not tested)  LUMBAR SPECIAL TESTS:  {lumbar special test:25242}  FUNCTIONAL TESTS:  {Functional tests:24029}  GAIT: Distance walked: *** Assistive device utilized: {Assistive devices:23999} Level of assistance: {Levels of assistance:24026} Comments: ***  TREATMENT DATE: ***                                                                                                                                 PATIENT EDUCATION:  Education details: *** Person educated: {Person educated:25204} Education method: {Education Method:25205} Education comprehension: {Education Comprehension:25206}  HOME EXERCISE PROGRAM: ***   ASSESSMENT:  CLINICAL IMPRESSION: Patient a 39 y.o. y.o. female who was seen today for physical therapy evaluation and treatment for ***. Patient presents with pain limited deficits in *** strength, ROM,  endurance, activity tolerance, and functional mobility with ADL. Patient is having to modify and restrict ADL as indicated by outcome measure score as well as subjective information and objective measures which is  affecting overall participation. Patient will benefit from skilled physical therapy in order to improve function and reduce impairment.  OBJECTIVE IMPAIRMENTS: decreased activity tolerance, difficulty walking, decreased balance, decreased endurance, decreased mobility, decreased ROM, decreased strength, impaired flexibility, impaired UE/LE use, postural dysfunction, and pain.  ACTIVITY LIMITATIONS: bending, lifting, carry, locomotion, cleaning, community activity, driving, and or occupation  PERSONAL FACTORS: *** are also affecting patient's functional outcome.  REHAB POTENTIAL: {rehabpotential:25112}  CLINICAL DECISION MAKING: {clinical decision making:25114}  EVALUATION COMPLEXITY: {Evaluation complexity:25115}    GOALS: Short term PT Goals Target date: *** Pt will be I and compliant with HEP. Baseline:  Goal status: New Pt will decrease pain by 25% overall Baseline: Goal status: New  Long term PT goals Target date: *** Pt will improve ROM to [facility] to improve functional mobility Baseline: Goal status: New Pt will improve  hip/knee strength to at least 5-/5 MMT to improve functional strength Baseline: Goal status: New Pt will improve FOTO to at least % functional to show improved function Baseline: Goal status: New Pt will reduce pain by overall 50% overall with usual activity Baseline: Goal status: New Pt will reduce pain to overall less than 2-3/10 with usual activity and work activity. Baseline: Goal status: New Pt will be able to ambulate community distances at least 1000 ft WNL gait pattern without complaints Baseline: Goal status: New  PLAN: PT FREQUENCY: 1-3 times per week   PT DURATION: 6-8 weeks  PLANNED INTERVENTIONS (unless contraindicated):  aquatic PT, Canalith repositioning, cryotherapy, Electrical stimulation, Iontophoresis with 4 mg/ml dexamethasome, Moist heat, traction, Ultrasound, gait training, Therapeutic exercise, balance training, neuromuscular re-education, patient/family education, prosthetic training, manual techniques, passive ROM, dry needling, taping, vasopnuematic device, vestibular, spinal manipulations, joint manipulations  PLAN FOR NEXT SESSION: ***    Rojean JONELLE Batten, PT 02/09/2024, 9:56 AM  "

## 2024-02-11 ENCOUNTER — Ambulatory Visit (HOSPITAL_BASED_OUTPATIENT_CLINIC_OR_DEPARTMENT_OTHER): Admitting: Physical Therapy

## 2024-02-23 ENCOUNTER — Ambulatory Visit: Admitting: Family Medicine

## 2024-02-23 VITALS — BP 126/86 | HR 85 | Ht 63.0 in | Wt 193.0 lb

## 2024-02-23 DIAGNOSIS — T7819XA Other adverse food reactions, not elsewhere classified, initial encounter: Secondary | ICD-10-CM | POA: Diagnosis not present

## 2024-02-23 DIAGNOSIS — Q7962 Hypermobile Ehlers-Danlos syndrome: Secondary | ICD-10-CM

## 2024-02-23 DIAGNOSIS — M5441 Lumbago with sciatica, right side: Secondary | ICD-10-CM | POA: Diagnosis not present

## 2024-02-23 DIAGNOSIS — G8929 Other chronic pain: Secondary | ICD-10-CM | POA: Diagnosis not present

## 2024-02-23 MED ORDER — MONTELUKAST SODIUM 10 MG PO TABS: 10.0000 mg | ORAL_TABLET | Freq: Every day | ORAL | 3 refills | Status: AC

## 2024-02-23 MED ORDER — PREDNISONE 5 MG (48) PO TBPK: ORAL_TABLET | ORAL | 0 refills | Status: AC

## 2024-02-23 MED ORDER — EPINEPHRINE 0.3 MG/0.3ML IJ SOAJ: 0.3000 mg | INTRAMUSCULAR | 1 refills | Status: AC | PRN

## 2024-02-23 NOTE — Patient Instructions (Addendum)
 Thank you for coming in today.   Please call [facility] ([facility]) at [phone] to schedule your spine injection.    Continue zyrtec and pepcid.  Add singulair .   Take the tapering course of prednisone .  If the injection can happen soon stop the prednisone .   Schedule PT  [facility]

## 2024-02-23 NOTE — Progress Notes (Signed)
 "        I, Ileana Collet, PhD, LAT, ATC acting as a scribe for Artist Lloyd, MD.  [name] is a 39 y.o. female who presents to [facility] at [facility] today for LE issues. Pt was last seen by Dr. Lloyd on 01/22/24 for hEDS, dysautonomia, and bilat LE paresthesia  Today, pt reports she is wondering if she should see a neurologist or a neurosurgeon. S/s have been ongoing for over a year. R foot drop. Weakness in the L foot. Anal paresthesia. Bladder and bowel dysfunction. Thoracic and lumbar back pain, bilaterally.   She notes her allergy  system has worsened recently.  She had a significant oral allergy  exacerbation with eggs which is unusual for her.  She wonders if mast cell activation syndrome is worsening.  She currently is taking Zyrtec and Pepcid.  Dx testing: 05/07/23 L-spine & T-spine MRI  Pertinent review of systems: No fevers or chills  Relevant historical information: May-Thurner syndrome status post venous stenting October 2025.  Dysautonomia and hypermobile EDS.   Exam:  BP 126/86   Pulse 85   Ht 5' 3 (1.6 m)   Wt 193 lb (87.5 kg)   SpO2 98%   BMI 34.19 kg/m  General: Well Developed, well nourished, and in no acute distress.   MSK: Lower extremity strength generally intact.  Reflexes are intact.    Lab and Radiology Results   EXAM: MRI LUMBAR SPINE WITHOUT AND WITH CONTRAST   TECHNIQUE: Multiplanar and multiecho pulse sequences of the lumbar spine were obtained without and with intravenous contrast.   CONTRAST:  8 mL Vueway  IV contrast   COMPARISON:  MRI 02/27/2023   FINDINGS: Segmentation:  Standard.   Alignment:  Physiologic.   Vertebrae:  No fracture, evidence of discitis, or bone lesion.   Conus medullaris and cauda equina: Conus extends to the L1 level. Conus and cauda equina appear normal. No abnormal postcontrast enhancement.   Paraspinal and other soft tissues: Fibroid uterus.   Disc levels:   T12-L1 through L2-L3:  Unremarkable.   L3-L4: Annular disc bulge, eccentric to the right. Mild bilateral facet arthropathy. No significant canal stenosis. Mild bilateral foraminal stenosis. Unchanged.   L4-L5: Annular disc bulge. Mild bilateral facet arthropathy. Mild bilateral subarticular recess stenosis without significant canal stenosis. Mild bilateral foraminal stenosis. Unchanged.   L5-S1: Disc bulge and endplate spurring, eccentric to the left. Mild facet hypertrophy. Mild left subarticular recess stenosis without canal stenosis. Mild left foraminal stenosis. Unchanged.   IMPRESSION: 1. No significant canal stenosis at any level. No abnormal nerve root enhancement. 2. Mild lower lumbar spondylosis with mild bilateral foraminal stenosis at L3-L4 and L4-L5 and mild left foraminal stenosis at L5-S1. 3. Mild bilateral subarticular recess stenosis at L4-L5 and mild left subarticular recess stenosis at L5-S1.     Electronically Signed   By: Mabel Converse D.O.   On: 06/01/2023 09:30 I, Artist Lloyd, personally (independently) visualized and performed the interpretation of the images attached in this note.     Assessment and Plan: 39 y.o. female with lower extremity paresthesia with some weakness of right foot dorsiflexion.  MRI from April 2025 does show neuroforaminal stenosis which could explain a lot of her symptoms.  Plan for epidural steroid injection.  Additionally she is having worsening allergic symptoms.  I did prescribe an EpiPen  that she can use.  She is currently taking Zyrtec and Pepcid.  Add montelukast .  Additionally prescribed a course of prednisone .   PDMP not  reviewed this encounter. Orders Placed This Encounter  Procedures   DG INJECT DIAG/THERA/INC NEEDLE/CATH/PLC EPI/LUMB/SAC W/IMG    Level and technique per radiology    Standing Status:   Future    Expiration Date:   02/22/2025    Reason for Exam (SYMPTOM  OR DIAGNOSIS REQUIRED):   Low back pain    Preferred Imaging  Location?:   [facility]    Is the patient pregnant?:   No   Meds ordered this encounter  Medications   EPINEPHrine  0.3 mg/0.3 mL IJ SOAJ injection    Sig: Inject 0.3 mg into the muscle as needed for anaphylaxis.    Dispense:  1 each    Refill:  1   montelukast  (SINGULAIR ) 10 MG tablet    Sig: Take 1 tablet (10 mg total) by mouth at bedtime.    Dispense:  90 tablet    Refill:  3   predniSONE  (STERAPRED UNI-PAK 48 TAB) 5 MG (48) TBPK tablet    Sig: 12 day dosepack po    Dispense:  48 tablet    Refill:  0     Discussed warning signs or symptoms. Please see discharge instructions. Patient expresses understanding.   The above documentation has been reviewed and is accurate and complete Artist Lloyd, M.D.   "

## 2024-03-19 ENCOUNTER — Other Ambulatory Visit

## 2024-04-21 ENCOUNTER — Ambulatory Visit: Admitting: Family Medicine

## 2024-04-24 ENCOUNTER — Ambulatory Visit (HOSPITAL_BASED_OUTPATIENT_CLINIC_OR_DEPARTMENT_OTHER): Admitting: Physical Therapy

## 2024-08-24 ENCOUNTER — Ambulatory Visit: Admitting: Obstetrics and Gynecology

## 2024-10-13 ENCOUNTER — Ambulatory Visit: Admitting: Allergy & Immunology

## 2024-10-14 ENCOUNTER — Ambulatory Visit: Admitting: Allergy & Immunology

## 2024-11-18 ENCOUNTER — Ambulatory Visit: Admitting: Rheumatology
